# Patient Record
Sex: Male | Born: 1970
Health system: Southern US, Community
[De-identification: ages and names within clinical notes are randomized; demographics above are authoritative.]

## PROBLEM LIST (undated history)

## (undated) DIAGNOSIS — F329 Major depressive disorder, single episode, unspecified: Secondary | ICD-10-CM

## (undated) DIAGNOSIS — T8859XA Other complications of anesthesia, initial encounter: Secondary | ICD-10-CM

## (undated) DIAGNOSIS — E785 Hyperlipidemia, unspecified: Secondary | ICD-10-CM

## (undated) DIAGNOSIS — Z9889 Other specified postprocedural states: Secondary | ICD-10-CM

## (undated) DIAGNOSIS — K5900 Constipation, unspecified: Secondary | ICD-10-CM

## (undated) DIAGNOSIS — S0990XA Unspecified injury of head, initial encounter: Secondary | ICD-10-CM

## (undated) DIAGNOSIS — K219 Gastro-esophageal reflux disease without esophagitis: Secondary | ICD-10-CM

## (undated) DIAGNOSIS — G629 Polyneuropathy, unspecified: Secondary | ICD-10-CM

## (undated) DIAGNOSIS — Z87442 Personal history of urinary calculi: Secondary | ICD-10-CM

## (undated) DIAGNOSIS — I10 Essential (primary) hypertension: Secondary | ICD-10-CM

## (undated) DIAGNOSIS — F32A Depression, unspecified: Secondary | ICD-10-CM

## (undated) DIAGNOSIS — R0602 Shortness of breath: Secondary | ICD-10-CM

## (undated) DIAGNOSIS — T4145XA Adverse effect of unspecified anesthetic, initial encounter: Secondary | ICD-10-CM

## (undated) DIAGNOSIS — R112 Nausea with vomiting, unspecified: Secondary | ICD-10-CM

## (undated) DIAGNOSIS — N200 Calculus of kidney: Secondary | ICD-10-CM

## (undated) DIAGNOSIS — F419 Anxiety disorder, unspecified: Secondary | ICD-10-CM

## (undated) DIAGNOSIS — J349 Unspecified disorder of nose and nasal sinuses: Secondary | ICD-10-CM

## (undated) DIAGNOSIS — G4730 Sleep apnea, unspecified: Secondary | ICD-10-CM

## (undated) HISTORY — DX: Gastro-esophageal reflux disease without esophagitis: K21.9

## (undated) HISTORY — DX: Depression, unspecified: F32.A

## (undated) HISTORY — DX: Polyneuropathy, unspecified: G62.9

## (undated) HISTORY — PX: WISDOM TOOTH EXTRACTION: SHX21

## (undated) HISTORY — DX: Major depressive disorder, single episode, unspecified: F32.9

## (undated) HISTORY — DX: Hyperlipidemia, unspecified: E78.5

## (undated) HISTORY — PX: DENTAL SURGERY: SHX609

---

## 2003-11-20 ENCOUNTER — Ambulatory Visit (HOSPITAL_BASED_OUTPATIENT_CLINIC_OR_DEPARTMENT_OTHER): Admission: RE | Admit: 2003-11-20 | Discharge: 2003-11-20 | Payer: Self-pay | Admitting: Emergency Medicine

## 2005-05-23 ENCOUNTER — Encounter: Admission: RE | Admit: 2005-05-23 | Discharge: 2005-08-21 | Payer: Self-pay | Admitting: Emergency Medicine

## 2011-05-23 ENCOUNTER — Ambulatory Visit (INDEPENDENT_AMBULATORY_CARE_PROVIDER_SITE_OTHER): Payer: PRIVATE HEALTH INSURANCE | Admitting: Family Medicine

## 2011-05-23 ENCOUNTER — Encounter: Payer: Self-pay | Admitting: Family Medicine

## 2011-05-23 DIAGNOSIS — E119 Type 2 diabetes mellitus without complications: Secondary | ICD-10-CM

## 2011-05-23 DIAGNOSIS — E785 Hyperlipidemia, unspecified: Secondary | ICD-10-CM

## 2011-05-23 DIAGNOSIS — IMO0002 Reserved for concepts with insufficient information to code with codable children: Secondary | ICD-10-CM

## 2011-05-23 DIAGNOSIS — I10 Essential (primary) hypertension: Secondary | ICD-10-CM | POA: Insufficient documentation

## 2011-05-23 DIAGNOSIS — E1165 Type 2 diabetes mellitus with hyperglycemia: Secondary | ICD-10-CM

## 2011-05-23 DIAGNOSIS — IMO0001 Reserved for inherently not codable concepts without codable children: Secondary | ICD-10-CM

## 2011-05-23 MED ORDER — FENOFIBRATE 150 MG PO CAPS: 1.0000 | ORAL_CAPSULE | Freq: Every day | ORAL | Status: DC

## 2011-05-23 NOTE — Patient Instructions (Signed)
Follow up in 3 months to recheck diabetes We'll notify you of your lab results and make any changes if needed Try and eliminate sugar drinks and make small changes to portion size- you can totally do this! Call with any questions or concerns Think of [place] as your home base Welcome!  We're glad to have you! Happy Early [place] Ouch!!!

## 2011-05-23 NOTE — Progress Notes (Signed)
  Subjective:    Patient ID: Victor Estrada, male    DOB: 1970/11/11, 41 y.o.   MRN: [id number]  HPI New to establish.  Previous MD- Deboraha Sprang.  DM- chronic problem, dx'd in 2007.  On Janumet and Amaryl.  Infrequently checking CBGs.  Last had labs in the fall of 2012.  Denies symptomatic lows.  Overdue on eye exam- [facility].  No CP, SOB, HAs, visual changes, edema.  GERD- on Zegerid OTC w/ some relief.  Pt still reports frequent sxs.  HTN- chronic problem, fair control today.  On Lisinopril.  Hyperlipidemia- chronic problem, on Lipofen and Simvastatin.  Last labs done in the fall.  Denies abd pain, N/V, myalgias.  Obesity- chronic problem.  Has seen nutrition multiple times in the past.  Lost 12 lbs by stopping sugary drinks previously but then resumed and gained it all back.   Review of Systems For ROS see HPI     Objective:   Physical Exam  Vitals reviewed. Constitutional: He is oriented to person, place, and time. He appears well-developed and well-nourished. No distress.       obese  HENT:  Head: Normocephalic and atraumatic.  Eyes: Conjunctivae and EOM are normal. Pupils are equal, round, and reactive to light.  Neck: Normal range of motion. Neck supple. No thyromegaly present.  Cardiovascular: Normal rate, regular rhythm, normal heart sounds and intact distal pulses.   No murmur heard. Pulmonary/Chest: Effort normal and breath sounds normal. No respiratory distress.  Abdominal: Soft. Bowel sounds are normal. He exhibits no distension.  Musculoskeletal: He exhibits no edema.  Lymphadenopathy:    He has no cervical adenopathy.  Neurological: He is alert and oriented to person, place, and time. No cranial nerve deficit.  Skin: Skin is warm and dry.  Psychiatric: He has a normal mood and affect. His behavior is normal.          Assessment & Plan:

## 2011-05-24 LAB — CBC WITH DIFFERENTIAL/PLATELET
Basophils Absolute: 0 10*3/uL (ref 0.0–0.1)
Basophils Relative: 0.2 % (ref 0.0–3.0)
Eosinophils Absolute: 0.1 10*3/uL (ref 0.0–0.7)
Eosinophils Relative: 0.8 % (ref 0.0–5.0)
HCT: 41.4 % (ref 39.0–52.0)
Hemoglobin: 13.9 g/dL (ref 13.0–17.0)
Lymphocytes Relative: 29.1 % (ref 12.0–46.0)
Lymphs Abs: 2.7 10*3/uL (ref 0.7–4.0)
MCHC: 33.6 g/dL (ref 30.0–36.0)
MCV: 85.9 fl (ref 78.0–100.0)
Monocytes Absolute: 0.1 10*3/uL (ref 0.1–1.0)
Monocytes Relative: 0.7 % — ABNORMAL LOW (ref 3.0–12.0)
Neutro Abs: 6.4 10*3/uL (ref 1.4–7.7)
Neutrophils Relative %: 69.2 % (ref 43.0–77.0)
Platelets: 310 10*3/uL (ref 150.0–400.0)
RBC: 4.82 Mil/uL (ref 4.22–5.81)
RDW: 13.8 % (ref 11.5–14.6)
WBC: 9.3 10*3/uL (ref 4.5–10.5)

## 2011-05-24 LAB — LIPID PANEL
Cholesterol: 155 mg/dL (ref 0–200)
HDL: 33.1 mg/dL — ABNORMAL LOW (ref >39.00)
Total CHOL/HDL Ratio: 5
Triglycerides: 353 mg/dL — ABNORMAL HIGH (ref 0.0–149.0)
VLDL: 70.6 mg/dL — ABNORMAL HIGH (ref 0.0–40.0)

## 2011-05-24 LAB — HEPATIC FUNCTION PANEL
ALT: 29 U/L (ref 0–53)
AST: 24 U/L (ref 0–37)
Albumin: 3.8 g/dL (ref 3.5–5.2)
Alkaline Phosphatase: 57 U/L (ref 39–117)
Bilirubin, Direct: 0 mg/dL (ref 0.0–0.3)
Total Bilirubin: 0.2 mg/dL — ABNORMAL LOW (ref 0.3–1.2)
Total Protein: 7 g/dL (ref 6.0–8.3)

## 2011-05-24 LAB — BASIC METABOLIC PANEL
BUN: 20 mg/dL (ref 6–23)
CO2: 23 mEq/L (ref 19–32)
Calcium: 9.3 mg/dL (ref 8.4–10.5)
Chloride: 104 mEq/L (ref 96–112)
Creatinine, Ser: 1.4 mg/dL (ref 0.4–1.5)
GFR: 59.18 mL/min — ABNORMAL LOW (ref >60.00)
Glucose, Bld: 288 mg/dL — ABNORMAL HIGH (ref 70–99)
Potassium: 5 mEq/L (ref 3.5–5.1)
Sodium: 137 mEq/L (ref 135–145)

## 2011-05-24 LAB — LDL CHOLESTEROL, DIRECT: Direct LDL: 87 mg/dL

## 2011-05-24 LAB — TSH: TSH: 0.76 u[IU]/mL (ref 0.35–5.50)

## 2011-05-24 LAB — HEMOGLOBIN A1C: Hgb A1c MFr Bld: 9.9 % — ABNORMAL HIGH (ref 4.6–6.5)

## 2011-05-26 NOTE — Assessment & Plan Note (Signed)
New to provider- chronic for pt.  Taking meds w/out difficulty.  Not checking CBGs.  Due for labs.  Will adjust meds prn.  Stressed the importance of good control and reviewed long term complications of uncontrolled sugars.  Advised to schedule eye exam.  Pt expressed understanding and is in agreement w/ plan.

## 2011-05-26 NOTE — Assessment & Plan Note (Signed)
New to provider.  Chronic problem for pt.  Discussed seeing nutrition again- pt has done this multiple times before and is not interested at this point.  Discussed eliminating sugary drinks, starting regular exercise.  Fear this will become huge problem for pt if it is not addressed now.  Will follow closely.

## 2011-05-26 NOTE — Assessment & Plan Note (Signed)
Chronic problem.  New to provider.  Due for labs.  Tolerating statin w/out difficulty.  Adjust meds prn.

## 2011-05-26 NOTE — Assessment & Plan Note (Signed)
Chronic problem.  Fair control.  On ACE.  Asymptomatic.  Stressed importance of weight loss to improve this issue.  Will follow.

## 2011-05-27 ENCOUNTER — Telehealth: Payer: Self-pay | Admitting: Family Medicine

## 2011-05-29 NOTE — Telephone Encounter (Signed)
Opened in error

## 2011-06-14 ENCOUNTER — Telehealth: Payer: Self-pay | Admitting: Family Medicine

## 2011-06-14 MED ORDER — FENOFIBRATE 150 MG PO CAPS: 1.0000 | ORAL_CAPSULE | Freq: Every day | ORAL | Status: DC

## 2011-06-14 NOTE — Telephone Encounter (Signed)
Patient states pharmacy does not have rx for Fenofibrate 150 mg. He would like [place] to send it again.

## 2011-06-14 NOTE — Telephone Encounter (Signed)
Rx sent 

## 2011-06-17 ENCOUNTER — Other Ambulatory Visit: Payer: Self-pay | Admitting: Family Medicine

## 2011-06-17 MED ORDER — SIMVASTATIN 20 MG PO TABS: 20.0000 mg | ORAL_TABLET | Freq: Every evening | ORAL | Status: DC

## 2011-06-17 MED ORDER — LISINOPRIL 20 MG PO TABS: 20.0000 mg | ORAL_TABLET | Freq: Every day | ORAL | Status: DC

## 2011-06-17 MED ORDER — GLIMEPIRIDE 4 MG PO TABS: 4.0000 mg | ORAL_TABLET | Freq: Two times a day (BID) | ORAL | Status: DC

## 2011-06-17 MED ORDER — SITAGLIPTIN PHOS-METFORMIN HCL 50-1000 MG PO TABS: 1.0000 | ORAL_TABLET | Freq: Two times a day (BID) | ORAL | Status: DC

## 2011-06-17 MED ORDER — FENOFIBRATE 150 MG PO CAPS: 1.0000 | ORAL_CAPSULE | Freq: Every day | ORAL | Status: DC

## 2011-06-17 NOTE — Telephone Encounter (Signed)
Patient called & states [facility] did not get the refill send Friday for: Fenofibrate (LIPOFEN) 150 MG CAPS  Can you re-send for 90-days worth along with all of the following  Glimepiride Lisinopril Simvastatin Janumet All also for 90-days worth  To [facility] [address] Patient ph# [phone] Please call patient once sent as per his request

## 2011-06-17 NOTE — Telephone Encounter (Signed)
Rx sent 

## 2011-08-21 ENCOUNTER — Ambulatory Visit: Payer: PRIVATE HEALTH INSURANCE | Admitting: Family Medicine

## 2011-09-13 ENCOUNTER — Other Ambulatory Visit: Payer: Self-pay | Admitting: Family Medicine

## 2011-09-13 MED ORDER — GLIMEPIRIDE 4 MG PO TABS: 4.0000 mg | ORAL_TABLET | Freq: Two times a day (BID) | ORAL | Status: DC

## 2011-09-13 NOTE — Telephone Encounter (Signed)
Refill Glimepiride 4MG  Tablets #160, take one tablet by mouth twice daily, last fill 5.29.13, last ov 2.18.13

## 2011-09-13 NOTE — Telephone Encounter (Signed)
rx sent to pharmacy by e-script Letter has been mailed to pt address noted in the chart to advise they are overdue for cpe/ov/labs and the pt needs to contact office to set up appt   

## 2011-09-23 ENCOUNTER — Telehealth: Payer: Self-pay | Admitting: Family Medicine

## 2011-09-23 MED ORDER — SIMVASTATIN 20 MG PO TABS: 20.0000 mg | ORAL_TABLET | Freq: Every evening | ORAL | Status: DC

## 2011-09-23 MED ORDER — SITAGLIPTIN PHOS-METFORMIN HCL 50-1000 MG PO TABS: 1.0000 | ORAL_TABLET | Freq: Two times a day (BID) | ORAL | Status: DC

## 2011-09-23 NOTE — Telephone Encounter (Signed)
rx sent to pharmacy by e-script per noted pt has upcoming apt

## 2011-09-23 NOTE — Telephone Encounter (Signed)
Refill: Simvastatin 20mg  tablets. Take 1 tablet by mouth every evening. Qty 90. Last fill 06-17-11

## 2011-09-23 NOTE — Telephone Encounter (Signed)
Refill: Janumet 50/1000mg  tab. Take 1 tablet by mouth twice daily with a meal. Qty 180. Last fill 08-21-10

## 2011-10-01 ENCOUNTER — Ambulatory Visit (INDEPENDENT_AMBULATORY_CARE_PROVIDER_SITE_OTHER): Payer: PRIVATE HEALTH INSURANCE | Admitting: Family Medicine

## 2011-10-01 ENCOUNTER — Encounter: Payer: Self-pay | Admitting: Family Medicine

## 2011-10-01 VITALS — BP 122/82 | HR 100 | Temp 98.2°F | Ht 70.5 in | Wt 320.0 lb

## 2011-10-01 DIAGNOSIS — IMO0002 Reserved for concepts with insufficient information to code with codable children: Secondary | ICD-10-CM

## 2011-10-01 DIAGNOSIS — E785 Hyperlipidemia, unspecified: Secondary | ICD-10-CM

## 2011-10-01 DIAGNOSIS — E1165 Type 2 diabetes mellitus with hyperglycemia: Secondary | ICD-10-CM

## 2011-10-01 DIAGNOSIS — IMO0001 Reserved for inherently not codable concepts without codable children: Secondary | ICD-10-CM

## 2011-10-01 DIAGNOSIS — I10 Essential (primary) hypertension: Secondary | ICD-10-CM

## 2011-10-01 LAB — LIPID PANEL
Cholesterol: 142 mg/dL (ref 0–200)
HDL: 36.8 mg/dL — ABNORMAL LOW (ref >39.00)
Total CHOL/HDL Ratio: 4
Triglycerides: 216 mg/dL — ABNORMAL HIGH (ref 0.0–149.0)
VLDL: 43.2 mg/dL — ABNORMAL HIGH (ref 0.0–40.0)

## 2011-10-01 LAB — HEPATIC FUNCTION PANEL
ALT: 24 U/L (ref 0–53)
AST: 19 U/L (ref 0–37)
Albumin: 3.9 g/dL (ref 3.5–5.2)
Alkaline Phosphatase: 42 U/L (ref 39–117)
Bilirubin, Direct: 0 mg/dL (ref 0.0–0.3)
Total Bilirubin: 0.6 mg/dL (ref 0.3–1.2)
Total Protein: 7.3 g/dL (ref 6.0–8.3)

## 2011-10-01 LAB — BASIC METABOLIC PANEL
BUN: 18 mg/dL (ref 6–23)
CO2: 26 mEq/L (ref 19–32)
Calcium: 9 mg/dL (ref 8.4–10.5)
Chloride: 102 mEq/L (ref 96–112)
Creatinine, Ser: 1.1 mg/dL (ref 0.4–1.5)
GFR: 77.85 mL/min (ref >60.00)
Glucose, Bld: 218 mg/dL — ABNORMAL HIGH (ref 70–99)
Potassium: 4.6 mEq/L (ref 3.5–5.1)
Sodium: 137 mEq/L (ref 135–145)

## 2011-10-01 LAB — HEMOGLOBIN A1C: Hgb A1c MFr Bld: 9.9 % — ABNORMAL HIGH (ref 4.6–6.5)

## 2011-10-01 NOTE — Patient Instructions (Addendum)
Follow up in 3 months to recheck diabetes We'll notify you of your lab results and make any changes if needed Try and get back to healthy diet and regular exercise Call with any questions or concerns I'm so sorry for your loss!! Hang in there!!

## 2011-10-01 NOTE — Assessment & Plan Note (Signed)
Pt has lost some weight but given mom's recent death has not done as well as he would have liked.  Has recently resumed attention to healthy diet and is attempting exercise.  Check labs.  Adjust meds prn.

## 2011-10-01 NOTE — Assessment & Plan Note (Signed)
Chronic problem.  Adequate control.  Asymptomatic.  No changes. 

## 2011-10-01 NOTE — Progress Notes (Signed)
  Subjective:    Patient ID: Victor Estrada, male    DOB: 1971-01-24, 41 y.o.   MRN: [id number]  HPI HTN- chronic problem, well controlled today.  On Lisinopril.  Denies CP, SOB, HAs, visual changes, edema.  Hyperlipidemia- chronic problem.  On Zocor and fenofibrate.  Due for labs.  Denies abd pain, N/V, myalgias  DM- chronic problem.  Has lost 8 lbs since last visit.  Had lost more weight but then mom got cancer and died w/in 6 weeks.  'i ate like crap during that time frame'.  On Amaryl and Janumet.  Is attempting to improve diet and get regular exercise.  Denies symptomatic lows, N/V.     Review of Systems For ROS see HPI     Objective:   Physical Exam  Vitals reviewed. Constitutional: He is oriented to person, place, and time. He appears well-developed and well-nourished. No distress.  HENT:  Head: Normocephalic and atraumatic.  Eyes: Conjunctivae and EOM are normal. Pupils are equal, round, and reactive to light.  Neck: Normal range of motion. Neck supple. No thyromegaly present.  Cardiovascular: Normal rate, regular rhythm, normal heart sounds and intact distal pulses.   No murmur heard. Pulmonary/Chest: Effort normal and breath sounds normal. No respiratory distress.  Abdominal: Soft. Bowel sounds are normal. He exhibits no distension.  Musculoskeletal: He exhibits no edema.  Lymphadenopathy:    He has no cervical adenopathy.  Neurological: He is alert and oriented to person, place, and time. No cranial nerve deficit.  Skin: Skin is warm and dry.  Psychiatric: He has a normal mood and affect. His behavior is normal.          Assessment & Plan:

## 2011-10-01 NOTE — Assessment & Plan Note (Signed)
Chronic problem.  Tolerating meds w/out difficulty.  Attempting to lose weight.  Check labs.  Adjust meds prn

## 2011-10-02 LAB — LDL CHOLESTEROL, DIRECT: Direct LDL: 76.3 mg/dL

## 2011-10-11 ENCOUNTER — Telehealth: Payer: Self-pay | Admitting: Family Medicine

## 2011-10-11 MED ORDER — FENOFIBRATE 150 MG PO CAPS: 1.0000 | ORAL_CAPSULE | Freq: Every day | ORAL | Status: DC

## 2011-10-11 NOTE — Telephone Encounter (Signed)
Pt states we were supposed to call in refills for his Fenofibrate 150mg  when he was here for his visit on 10/01/11 and we never did. He is now calling requesting this be refilled today because he is leaving to go out of town. Pt uses [facility] [address]

## 2011-10-11 NOTE — Telephone Encounter (Signed)
rx sent to pharmacy by e-script  

## 2011-10-21 ENCOUNTER — Telehealth: Payer: Self-pay | Admitting: Family Medicine

## 2011-10-21 MED ORDER — LISINOPRIL 20 MG PO TABS: 20.0000 mg | ORAL_TABLET | Freq: Every day | ORAL | Status: DC

## 2011-10-21 NOTE — Telephone Encounter (Signed)
Refill: Lisinopril 20mg  tablets. Take 1 tablet by mouth daily. Qty 90. Last fill 09-12-11

## 2011-10-21 NOTE — Telephone Encounter (Signed)
rx sent to pharmacy by e-script  

## 2011-11-20 ENCOUNTER — Other Ambulatory Visit: Payer: Self-pay | Admitting: Family Medicine

## 2011-11-20 MED ORDER — SIMVASTATIN 20 MG PO TABS: 20.0000 mg | ORAL_TABLET | Freq: Every evening | ORAL | Status: DC

## 2011-11-20 NOTE — Telephone Encounter (Signed)
refill Simvastatin (Tab) 20 MG Take 1 tablet (20 mg total) by mouth every evening. #90, last fill 7.1.13 Last ov 7.9.13 f/u DM

## 2011-11-20 NOTE — Telephone Encounter (Signed)
rx sent to pharmacy by e-script  

## 2011-11-29 ENCOUNTER — Other Ambulatory Visit: Payer: Self-pay | Admitting: Family Medicine

## 2011-11-29 MED ORDER — GLIMEPIRIDE 4 MG PO TABS: 4.0000 mg | ORAL_TABLET | Freq: Two times a day (BID) | ORAL | Status: DC

## 2011-11-29 NOTE — Telephone Encounter (Signed)
rx sent to pharmacy by e-script  

## 2011-11-29 NOTE — Telephone Encounter (Signed)
Refill Glimepiride (Tab) 4 MG Take 1 tablet (4 mg total) by mouth 2 (two) times daily #160 last fill 8.19.13 Last ov 7.9.13 f/u DM

## 2011-12-20 ENCOUNTER — Encounter: Payer: Self-pay | Admitting: Family Medicine

## 2011-12-20 ENCOUNTER — Ambulatory Visit (INDEPENDENT_AMBULATORY_CARE_PROVIDER_SITE_OTHER): Payer: PRIVATE HEALTH INSURANCE | Admitting: Family Medicine

## 2011-12-20 ENCOUNTER — Telehealth: Payer: Self-pay | Admitting: Family Medicine

## 2011-12-20 VITALS — BP 128/87 | HR 102 | Temp 98.4°F | Ht 69.5 in | Wt 324.0 lb

## 2011-12-20 DIAGNOSIS — J329 Chronic sinusitis, unspecified: Secondary | ICD-10-CM

## 2011-12-20 MED ORDER — AMOXICILLIN 875 MG PO TABS: 875.0000 mg | ORAL_TABLET | Freq: Two times a day (BID) | ORAL | Status: DC

## 2011-12-20 NOTE — Telephone Encounter (Signed)
MD Beverely Low will see pt today

## 2011-12-20 NOTE — Patient Instructions (Addendum)
This is likely an early sinus infection Start the Amox twice daily- take w/ food Drink plenty of fluids Mucinex to thin your congestion REST! Hang in there!!!

## 2011-12-20 NOTE — Progress Notes (Signed)
  Subjective:    Patient ID: Victor Estrada, male    DOB: June 09, 1970, 42 y.o.   MRN: [id number]  HPI ? Sinus infection- sxs started over the weekend, worsened yesterday w/ sinus pressure, nasal congestion, chills.  +HA.  No ear pain.  + tooth pain.  Minimal cough.  No documented fever.  + sick contacts.   Review of Systems For ROS see HPI     Objective:   Physical Exam  Constitutional: He appears well-developed and well-nourished. No distress.  HENT:  Head: Normocephalic and atraumatic.  Right Ear: Tympanic membrane normal.  Left Ear: Tympanic membrane normal.  Nose: Mucosal edema and rhinorrhea present. Right sinus exhibits maxillary sinus tenderness and frontal sinus tenderness. Left sinus exhibits maxillary sinus tenderness and frontal sinus tenderness.  Mouth/Throat: Mucous membranes are normal. Oropharyngeal exudate and posterior oropharyngeal erythema present. No posterior oropharyngeal edema.       + PND  Eyes: Conjunctivae normal and EOM are normal. Pupils are equal, round, and reactive to light.  Neck: Normal range of motion. Neck supple.  Cardiovascular: Normal rate, regular rhythm and normal heart sounds.   Pulmonary/Chest: Effort normal and breath sounds normal. No respiratory distress. He has no wheezes.  Lymphadenopathy:    He has no cervical adenopathy.  Skin: Skin is warm and dry.          Assessment & Plan:

## 2011-12-20 NOTE — Assessment & Plan Note (Signed)
New.  Start abx.  Reviewed supportive care and red flags that should prompt return.  Pt expressed understanding and is in agreement w/ plan.  

## 2011-12-20 NOTE — Telephone Encounter (Signed)
Was treated by MD Tabori in office today 

## 2011-12-20 NOTE — Telephone Encounter (Signed)
Caller: [name]; Patient Name: Victor Estrada; PCP: Sheliah Hatch.; Best Callback Phone Number: [phone]. Onset of sinus pressure for 12/18/11.  Nasal drainage -green in color. Felt warm unsure of fever. +slight sore throat. Feels sinus pressure. Eating and drinking. Emergent s/sx ruled out per Upper Respiratory Infection with exception to "Productive Cough with colored sputum".  See Provider in 24 hours. Appointment scheduled today 12/20/11  with Dr. Beverely Low at 11;15.  Caller expressed understanding. Home care advise given. Advised to call back for questions, changes or concerns.

## 2011-12-23 ENCOUNTER — Telehealth: Payer: Self-pay | Admitting: Family Medicine

## 2011-12-23 MED ORDER — SITAGLIPTIN PHOS-METFORMIN HCL 50-1000 MG PO TABS: 1.0000 | ORAL_TABLET | Freq: Two times a day (BID) | ORAL | Status: DC

## 2011-12-23 NOTE — Telephone Encounter (Signed)
rx sent to pharmacy by e-script to last pt til upcoming OV 

## 2011-12-23 NOTE — Telephone Encounter (Signed)
Refill: Janumet 50/1000 mg tablets. Take 1 tablet by mouth twice daily with a meal. Qty 180. Last fill 8.30.13

## 2012-01-01 ENCOUNTER — Ambulatory Visit: Payer: PRIVATE HEALTH INSURANCE | Admitting: Family Medicine

## 2012-01-08 ENCOUNTER — Other Ambulatory Visit: Payer: Self-pay | Admitting: Family Medicine

## 2012-01-08 NOTE — Telephone Encounter (Signed)
refill glimepiride 4mg  tablets #160 TK 1 T PO BID --LAST FILL 10.8.13--last ov 9.27.13-acute

## 2012-01-09 MED ORDER — GLIMEPIRIDE 4 MG PO TABS: 4.0000 mg | ORAL_TABLET | Freq: Two times a day (BID) | ORAL | Status: DC

## 2012-01-09 NOTE — Telephone Encounter (Signed)
refill glimepiride 4mg  tablets #160 TK 1 T PO BID --LAST FILL 10.8.13 --last ov 9.27.13-acute      plz advise          MW

## 2012-01-09 NOTE — Telephone Encounter (Signed)
Ok for #180, 1 refill 

## 2012-01-09 NOTE — Telephone Encounter (Signed)
Rx sent.    MW 

## 2012-01-15 ENCOUNTER — Other Ambulatory Visit: Payer: Self-pay | Admitting: Family Medicine

## 2012-01-15 NOTE — Telephone Encounter (Signed)
refill Lipofen 150mg  Capsules #90TK 1 C PO QD --LAST OV 9.27.13 Acute

## 2012-01-16 MED ORDER — FENOFIBRATE 150 MG PO CAPS: 1.0000 | ORAL_CAPSULE | Freq: Every day | ORAL | Status: DC

## 2012-01-16 NOTE — Telephone Encounter (Signed)
Rx sent.    MW 

## 2012-01-22 ENCOUNTER — Other Ambulatory Visit: Payer: Self-pay | Admitting: Family Medicine

## 2012-01-22 MED ORDER — SITAGLIPTIN PHOS-METFORMIN HCL 50-1000 MG PO TABS: 1.0000 | ORAL_TABLET | Freq: Two times a day (BID) | ORAL | Status: DC

## 2012-01-22 NOTE — Telephone Encounter (Signed)
rx sent to pharmacy by e-script  

## 2012-01-22 NOTE — Telephone Encounter (Signed)
refill janumet 50/1000mg  tablets #60 take 1 tablet by mouth twice daily with a meal last fill 9.30.13--last ov 9.27.13 acute

## 2012-01-27 ENCOUNTER — Ambulatory Visit (INDEPENDENT_AMBULATORY_CARE_PROVIDER_SITE_OTHER): Payer: PRIVATE HEALTH INSURANCE | Admitting: Family Medicine

## 2012-01-27 ENCOUNTER — Encounter: Payer: Self-pay | Admitting: Family Medicine

## 2012-01-27 VITALS — BP 126/80 | HR 98 | Temp 98.1°F | Resp 16 | Wt 323.2 lb

## 2012-01-27 DIAGNOSIS — IMO0001 Reserved for inherently not codable concepts without codable children: Secondary | ICD-10-CM

## 2012-01-27 DIAGNOSIS — F4323 Adjustment disorder with mixed anxiety and depressed mood: Secondary | ICD-10-CM

## 2012-01-27 DIAGNOSIS — Z23 Encounter for immunization: Secondary | ICD-10-CM

## 2012-01-27 DIAGNOSIS — IMO0002 Reserved for concepts with insufficient information to code with codable children: Secondary | ICD-10-CM

## 2012-01-27 DIAGNOSIS — E1165 Type 2 diabetes mellitus with hyperglycemia: Secondary | ICD-10-CM

## 2012-01-27 LAB — BASIC METABOLIC PANEL
BUN: 17 mg/dL (ref 6–23)
CO2: 23 mEq/L (ref 19–32)
Calcium: 9.1 mg/dL (ref 8.4–10.5)
Chloride: 104 mEq/L (ref 96–112)
Creatinine, Ser: 1 mg/dL (ref 0.4–1.5)
GFR: 89.74 mL/min (ref >60.00)
Glucose, Bld: 232 mg/dL — ABNORMAL HIGH (ref 70–99)
Potassium: 4.5 mEq/L (ref 3.5–5.1)
Sodium: 137 mEq/L (ref 135–145)

## 2012-01-27 LAB — HEMOGLOBIN A1C: Hgb A1c MFr Bld: 10.5 % — ABNORMAL HIGH (ref 4.6–6.5)

## 2012-01-27 NOTE — Patient Instructions (Addendum)
We'll notify you of your lab results and determine the next steps Call and set up counseling- either with your previous provider or w/ Hospice Continue to make healthy food choices and start regular exercise- this is also a good stress outlet Call with any questions or concerns Hang in there!!!

## 2012-01-27 NOTE — Assessment & Plan Note (Signed)
Chronic problem.  Pt wanted to hold on Victoza 3 months ago and work on diet and exercise.  Has not done this- has actually gained weight.  A1C has increased and is now about 10.  Will refer to Endo for management of DM.

## 2012-01-27 NOTE — Progress Notes (Signed)
  Subjective:    Patient ID: Victor Estrada, male    DOB: 08/04/1970, 41 y.o.   MRN: [id number]  HPI DM- chronic problem, A1C was 9.9 at last check.  Discussed starting Victoza at that time but pt wanted to wait 3 more months to improve diet and exercise.  Pt admits that 6 weeks ago 'i went off the track real bad'.  Sugars have been high.  Has been depressed about death of mom.  At highest weight was 337 lbs on home school.  Denies CP, SOB, HAs, visual changes, edema.  Grief- pt reports he's having difficulty getting out of bed some mornings.  'sometimes i work- sometimes i don't'.  Was eating very poorly.  Stopped exercising.  Admits to anger over mom's death.  Feels no one can relate to his loss.  Denies SI/HI.   Review of Systems For ROS see HPI     Objective:   Physical Exam  Vitals reviewed. Constitutional: He is oriented to person, place, and time. He appears well-developed and well-nourished. No distress.  HENT:  Head: Normocephalic and atraumatic.  Eyes: Conjunctivae normal and EOM are normal. Pupils are equal, round, and reactive to light.  Neck: Normal range of motion. Neck supple. No thyromegaly present.  Cardiovascular: Normal rate, regular rhythm, normal heart sounds and intact distal pulses.   No murmur heard. Pulmonary/Chest: Effort normal and breath sounds normal. No respiratory distress.  Abdominal: Soft. Bowel sounds are normal. He exhibits no distension.  Musculoskeletal: He exhibits no edema.  Lymphadenopathy:    He has no cervical adenopathy.  Neurological: He is alert and oriented to person, place, and time. No cranial nerve deficit.  Skin: Skin is warm and dry.  Psychiatric: He has a normal mood and affect. His behavior is normal.          Assessment & Plan:

## 2012-01-27 NOTE — Assessment & Plan Note (Signed)
New.  Pt is grieving for mom and still very angry about loss.  Encouraged him to seek counseling or hospice grief support group.  Pt in agreement.  Will follow.

## 2012-01-27 NOTE — Assessment & Plan Note (Addendum)
Chronic problem.  Pt continues to gain weight rather than lose.  Pt aware that this is a problem and negatively impacting multiple health issues.  Strongly encouraged him to resume exercise and follow low carb diet.  Will continue to follow.

## 2012-02-05 ENCOUNTER — Ambulatory Visit (INDEPENDENT_AMBULATORY_CARE_PROVIDER_SITE_OTHER): Payer: PRIVATE HEALTH INSURANCE | Admitting: Endocrinology

## 2012-02-05 ENCOUNTER — Encounter: Payer: Self-pay | Admitting: Endocrinology

## 2012-02-05 VITALS — BP 126/70 | HR 104 | Temp 98.2°F | Wt 328.0 lb

## 2012-02-05 DIAGNOSIS — IMO0001 Reserved for inherently not codable concepts without codable children: Secondary | ICD-10-CM

## 2012-02-05 DIAGNOSIS — E1165 Type 2 diabetes mellitus with hyperglycemia: Secondary | ICD-10-CM

## 2012-02-05 NOTE — Progress Notes (Signed)
Subjective:    Patient ID: Victor Estrada, male    DOB: [birth year], 41 y.o.   MRN: [id number]  HPI pt states 8 years h/o dm.  it is complicated by retinopathy.  he has never been on insulin.  pt says his diet and exercise have been limited for the past year or so, by his mother's illness (she passed away a few mos ago).   Pt states a few years of slight intermittent blurry vision from both eyes, and assoc fatigue.  He says he has recently seen opthal for eye sxs. Past Medical History  Diagnosis Date  . Diabetes mellitus   . Hyperlipidemia   . GERD (gastroesophageal reflux disease)   . Kidney stones    No past surgical history on file.  History   Social History  . Marital Status: Married    Spouse Name: N/A    Number of Children: N/A  . Years of Education: N/A   Occupational History  . Not on file.   Social History Main Topics  . Smoking status: Never Smoker   . Smokeless tobacco: Not on file  . Alcohol Use: No  . Drug Use: No  . Sexually Active: Not on file   Other Topics Concern  . Not on file   Social History Narrative  . No narrative on file    Current Outpatient Prescriptions on File Prior to Visit  Medication Sig Dispense Refill  . amoxicillin (AMOXIL) 875 MG tablet Take 1 tablet (875 mg total) by mouth 2 (two) times daily.  20 tablet  0  . Fenofibrate (LIPOFEN) 150 MG CAPS Take 1 capsule (150 mg total) by mouth daily.  90 each  1  . glimepiride (AMARYL) 4 MG tablet Take 1 tablet (4 mg total) by mouth 2 (two) times daily.  180 tablet  1  . lisinopril (PRINIVIL,ZESTRIL) 20 MG tablet Take 1 tablet (20 mg total) by mouth daily.  90 tablet  1  . simvastatin (ZOCOR) 20 MG tablet Take 1 tablet (20 mg total) by mouth every evening.  90 tablet  0  . sitaGLIPtan-metformin (JANUMET) 50-1000 MG per tablet Take 1 tablet by mouth 2 (two) times daily with a meal.  60 tablet  2    No Known Allergies  Family History  Problem Relation Age of Onset  . Hyperlipidemia Father     . Heart disease Father   . Diabetes Father   . Heart disease Maternal Grandmother   . Diabetes Paternal Grandmother    There were no vitals taken for this visit.  Review of Systems denies weight loss, headache, chest pain, sob, n/v, urinary frequency, hypoglycemia, and easy bruising.  He has bilat foot pain, worst at night.  zegerid helps heartburn.   He has intermittent leg cramps, difficulty with concentration, depression, rhinorrhea, and excessive diaphoresis.    Objective:   Physical Exam VS: see vs page GEN: no distress.  Morbid obesity.   HEAD: head: no deformity eyes: no periorbital swelling, no proptosis external nose and ears are normal.   mouth: no lesion seen NECK: supple, thyroid is not enlarged CHEST WALL: no deformity LUNGS:  Clear to auscultation CV: reg rate and rhythm, no murmur ABD: abdomen is soft, nontender.  no hepatosplenomegaly.  not distended.  no hernia MUSCULOSKELETAL: muscle bulk and strength are grossly normal.  no obvious joint swelling.  gait is normal and steady EXTEMITIES: no deformity.  no ulcer on the feet.  feet are of normal color and temp.  Trace bilat leg edema.   PULSES: dorsalis pedis intact bilat.  no carotid bruit NEURO:  cn 2-12 grossly intact.   readily moves all 4's.  sensation is intact to touch on the feet SKIN:  Normal texture and temperature.  No rash or suspicious lesion is visible.   NODES:  None palpable at the neck PSYCH: alert, oriented x3.  Does not appear anxious nor depressed. Lab Results  Component Value Date   HGBA1C 10.5* 01/27/2012      Assessment & Plan:  Type 2 DM.  Although orals could be added, it is very unlikely he would be controlled without insulin. Depression, exac by bereavement.  This complicates the rx of DM. Blurry vision, uncertain etiology.  Could be DM-related.

## 2012-02-05 NOTE — Patient Instructions (Addendum)
good diet and exercise habits significanly improve the control of your diabetes.  please let me know if you wish to be referred to a dietician, or for weight-loss surgery.  high blood sugar is very risky to your health.  you should see an eye doctor every year.  You are at higher than average risk for pneumonia and hepatitis-B.  You should be vaccinated against both.   controlling your blood pressure and cholesterol drastically reduces the damage diabetes does to your body.  this also applies to quitting smoking.  please discuss these with your doctor.  you should take an aspirin every day, unless you have been advised by a doctor not to. check your blood sugar twice a day.  vary the time of day when you check, between before the 3 meals, and at bedtime.  also check if you have symptoms of your blood sugar being too high or too low.  please keep a record of the readings and bring it to your next appointment here.  please call us sooner if your blood sugar goes below 70, or if you have a lot of readings over 200. You should take insulin Please consider your options and let me know.

## 2012-03-23 ENCOUNTER — Telehealth: Payer: Self-pay | Admitting: Family Medicine

## 2012-03-23 NOTE — Telephone Encounter (Signed)
refill Simvastatin (Tab) 20 MG Take 1 tablet (20 mg total) by mouth every evening. #90 last fill 9.30.13, last ov wt/labs 11.4.13 DM f/u

## 2012-03-24 ENCOUNTER — Other Ambulatory Visit: Payer: Self-pay | Admitting: *Deleted

## 2012-03-24 DIAGNOSIS — E785 Hyperlipidemia, unspecified: Secondary | ICD-10-CM

## 2012-03-24 MED ORDER — SIMVASTATIN 20 MG PO TABS: 20.0000 mg | ORAL_TABLET | Freq: Every evening | ORAL | Status: DC

## 2012-03-24 NOTE — Telephone Encounter (Signed)
Refill for simvastatin sent to pharmacy 

## 2012-04-21 ENCOUNTER — Other Ambulatory Visit: Payer: Self-pay | Admitting: Family Medicine

## 2012-04-21 MED ORDER — LISINOPRIL 20 MG PO TABS: 20.0000 mg | ORAL_TABLET | Freq: Every day | ORAL | Status: DC

## 2012-04-21 NOTE — Telephone Encounter (Signed)
refill Lisinopril (Tab) 20 MG Take 1 tablet (20 mg total) by mouth daily. #90 last fill 10.30.13

## 2012-04-21 NOTE — Telephone Encounter (Signed)
Rx sent to the pharmacy by e-script.//AB/CMA 

## 2012-04-22 ENCOUNTER — Other Ambulatory Visit: Payer: Self-pay | Admitting: Family Medicine

## 2012-04-22 NOTE — Telephone Encounter (Signed)
refill JANUMET 50-1000 MG Take 1 tablet by mouth 2 (two) times daily with a meal. #60 last fill 12.30.13

## 2012-04-23 MED ORDER — SITAGLIPTIN PHOS-METFORMIN HCL 50-1000 MG PO TABS: 1.0000 | ORAL_TABLET | Freq: Two times a day (BID) | ORAL | Status: DC

## 2012-04-23 NOTE — Telephone Encounter (Signed)
Refill done.  

## 2012-04-30 ENCOUNTER — Other Ambulatory Visit: Payer: Self-pay | Admitting: *Deleted

## 2012-04-30 DIAGNOSIS — E119 Type 2 diabetes mellitus without complications: Secondary | ICD-10-CM

## 2012-04-30 DIAGNOSIS — I10 Essential (primary) hypertension: Secondary | ICD-10-CM

## 2012-04-30 MED ORDER — SITAGLIPTIN PHOS-METFORMIN HCL 50-1000 MG PO TABS: 1.0000 | ORAL_TABLET | Freq: Two times a day (BID) | ORAL | Status: DC

## 2012-04-30 MED ORDER — LISINOPRIL 20 MG PO TABS: 20.0000 mg | ORAL_TABLET | Freq: Every day | ORAL | Status: DC

## 2012-04-30 NOTE — Telephone Encounter (Signed)
Refills for Janumet and Lisinopril sent to [facility] in [place] per to request.

## 2012-06-29 ENCOUNTER — Telehealth: Payer: Self-pay | Admitting: Family Medicine

## 2012-06-29 DIAGNOSIS — E785 Hyperlipidemia, unspecified: Secondary | ICD-10-CM

## 2012-06-29 MED ORDER — SIMVASTATIN 20 MG PO TABS: 20.0000 mg | ORAL_TABLET | Freq: Every evening | ORAL | Status: DC

## 2012-06-29 NOTE — Telephone Encounter (Signed)
Refill: Simvastatin 20 mg tablets. Take 1 tablet by mouth every evening. Qty 90. Last fill 03-24-12

## 2012-06-29 NOTE — Telephone Encounter (Signed)
Rx sent to the pharmacy by e-script.//AB/CMA 

## 2012-07-08 ENCOUNTER — Telehealth: Payer: Self-pay | Admitting: Family Medicine

## 2012-07-08 NOTE — Telephone Encounter (Signed)
GLIMEPIRIDE 4 MG TABLETS QTY:180 LAST REFILL: 2.9.24 TK 1 T PO BID

## 2012-07-13 MED ORDER — GLIMEPIRIDE 4 MG PO TABS: 4.0000 mg | ORAL_TABLET | Freq: Two times a day (BID) | ORAL | Status: DC

## 2012-07-13 NOTE — Telephone Encounter (Signed)
Med filled.  

## 2012-09-02 ENCOUNTER — Other Ambulatory Visit: Payer: Self-pay | Admitting: General Practice

## 2012-09-02 DIAGNOSIS — E785 Hyperlipidemia, unspecified: Secondary | ICD-10-CM

## 2012-09-02 MED ORDER — SIMVASTATIN 20 MG PO TABS: 20.0000 mg | ORAL_TABLET | Freq: Every evening | ORAL | Status: DC

## 2012-10-19 ENCOUNTER — Other Ambulatory Visit: Payer: Self-pay | Admitting: Family Medicine

## 2012-10-21 ENCOUNTER — Other Ambulatory Visit: Payer: Self-pay | Admitting: *Deleted

## 2012-10-21 MED ORDER — FENOFIBRATE 150 MG PO CAPS: 1.0000 | ORAL_CAPSULE | Freq: Every day | ORAL | Status: DC

## 2012-10-21 NOTE — Telephone Encounter (Signed)
RF for lipofen sent to [facility]

## 2012-12-30 ENCOUNTER — Other Ambulatory Visit: Payer: Self-pay | Admitting: Family Medicine

## 2012-12-30 NOTE — Telephone Encounter (Signed)
Med filled for only 30 days. Needs an OV.

## 2013-01-19 ENCOUNTER — Other Ambulatory Visit: Payer: Self-pay | Admitting: Family Medicine

## 2013-01-20 NOTE — Telephone Encounter (Signed)
Last OV 01-27-12 Med filled 09-24-12 #90 with 1 refill

## 2013-01-28 ENCOUNTER — Other Ambulatory Visit: Payer: Self-pay

## 2013-02-01 ENCOUNTER — Other Ambulatory Visit: Payer: Self-pay | Admitting: Family Medicine

## 2013-02-01 NOTE — Telephone Encounter (Signed)
Last OV 01-27-12 Meds both filled 12-30-12 #30 with 0  No upcoming appts.

## 2013-02-01 NOTE — Telephone Encounter (Signed)
Med filled.  

## 2013-02-02 ENCOUNTER — Other Ambulatory Visit: Payer: Self-pay | Admitting: Family Medicine

## 2013-02-02 NOTE — Telephone Encounter (Signed)
Please advise pt was established with Victor Estrada for diabetes last year. Has not seen either provider in a year please advise who is responsible for this refill?

## 2013-02-03 NOTE — Telephone Encounter (Signed)
Pt was supposed to follow up w/ Dr Everardo All- no refills

## 2013-02-05 LAB — HM DIABETES EYE EXAM

## 2013-02-06 ENCOUNTER — Other Ambulatory Visit: Payer: Self-pay | Admitting: Family Medicine

## 2013-02-08 NOTE — Telephone Encounter (Signed)
Med filled for only one month. Pt needs an appt.

## 2013-04-09 ENCOUNTER — Other Ambulatory Visit: Payer: Self-pay | Admitting: Family Medicine

## 2013-04-09 NOTE — Telephone Encounter (Signed)
Med filled with 1 refill, pt has a CPE next month.

## 2013-05-19 ENCOUNTER — Encounter: Payer: PRIVATE HEALTH INSURANCE | Admitting: Family Medicine

## 2013-05-26 ENCOUNTER — Telehealth: Payer: Self-pay

## 2013-05-26 NOTE — Telephone Encounter (Signed)
Left message for call back Non-identifiable   Flu- Td- 03/25/06

## 2013-05-28 ENCOUNTER — Ambulatory Visit (INDEPENDENT_AMBULATORY_CARE_PROVIDER_SITE_OTHER): Payer: BC Managed Care – PPO | Admitting: Family Medicine

## 2013-05-28 ENCOUNTER — Encounter: Payer: Self-pay | Admitting: Family Medicine

## 2013-05-28 VITALS — BP 124/74 | HR 85 | Temp 98.1°F | Resp 16 | Ht 70.5 in | Wt 316.4 lb

## 2013-05-28 DIAGNOSIS — J309 Allergic rhinitis, unspecified: Secondary | ICD-10-CM

## 2013-05-28 DIAGNOSIS — IMO0002 Reserved for concepts with insufficient information to code with codable children: Secondary | ICD-10-CM

## 2013-05-28 DIAGNOSIS — IMO0001 Reserved for inherently not codable concepts without codable children: Secondary | ICD-10-CM

## 2013-05-28 DIAGNOSIS — E1165 Type 2 diabetes mellitus with hyperglycemia: Secondary | ICD-10-CM

## 2013-05-28 DIAGNOSIS — K219 Gastro-esophageal reflux disease without esophagitis: Secondary | ICD-10-CM | POA: Insufficient documentation

## 2013-05-28 DIAGNOSIS — Z0000 Encounter for general adult medical examination without abnormal findings: Secondary | ICD-10-CM

## 2013-05-28 LAB — BASIC METABOLIC PANEL
BUN: 20 mg/dL (ref 6–23)
CO2: 24 mEq/L (ref 19–32)
Calcium: 9.2 mg/dL (ref 8.4–10.5)
Chloride: 102 mEq/L (ref 96–112)
Creatinine, Ser: 0.9 mg/dL (ref 0.4–1.5)
GFR: 95.9 mL/min (ref >60.00)
Glucose, Bld: 89 mg/dL (ref 70–99)
Potassium: 4.1 mEq/L (ref 3.5–5.1)
Sodium: 137 mEq/L (ref 135–145)

## 2013-05-28 LAB — LIPID PANEL
Cholesterol: 108 mg/dL (ref 0–200)
HDL: 32.4 mg/dL — ABNORMAL LOW (ref >39.00)
LDL Cholesterol: 39 mg/dL (ref 0–99)
Total CHOL/HDL Ratio: 3
Triglycerides: 185 mg/dL — ABNORMAL HIGH (ref 0.0–149.0)
VLDL: 37 mg/dL (ref 0.0–40.0)

## 2013-05-28 LAB — HEPATIC FUNCTION PANEL
ALT: 28 U/L (ref 0–53)
AST: 18 U/L (ref 0–37)
Albumin: 3.7 g/dL (ref 3.5–5.2)
Alkaline Phosphatase: 60 U/L (ref 39–117)
Bilirubin, Direct: 0 mg/dL (ref 0.0–0.3)
Total Bilirubin: 0.4 mg/dL (ref 0.3–1.2)
Total Protein: 7 g/dL (ref 6.0–8.3)

## 2013-05-28 LAB — HEMOGLOBIN A1C: Hgb A1c MFr Bld: 6.8 % — ABNORMAL HIGH (ref 4.6–6.5)

## 2013-05-28 LAB — TSH: TSH: 0.51 u[IU]/mL (ref 0.35–5.50)

## 2013-05-28 MED ORDER — PANTOPRAZOLE SODIUM 40 MG PO TBEC: 40.0000 mg | DELAYED_RELEASE_TABLET | Freq: Every day | ORAL | Status: DC

## 2013-05-28 NOTE — Assessment & Plan Note (Signed)
New.  Pt's sxs are not relieved w/ OTC meds.  Start prescription Protonix and refer to GI for complete evaluation.  Pt expressed understanding and is in agreement w/ plan.

## 2013-05-28 NOTE — Assessment & Plan Note (Signed)
Pt's PE WNL w/ exception of morbid obesity.  Check labs.  Anticipatory guidance provided.

## 2013-05-28 NOTE — Assessment & Plan Note (Signed)
New.  Start symptomatic tx w/ OTC antihistamine and nasal steroid.  If no improvement, will then refer to ENT.

## 2013-05-28 NOTE — Progress Notes (Signed)
   Subjective:    Patient ID: Victor Estrada, male    DOB: 1970/12/08, 43 y.o.   MRN: [id number]  HPI CPE-   GERD- pt is on Zegerid OTC but continues to have constant reflux regardless of what he eats.  Frequent belching, gas bubbles in chest.  Sinus issues- pt reports 'they're always been bad but it's worse'.  Reports regularly blowing nose and getting copious return.  Not currently taking OTC antihistamine.  DM- has not had eye exam in 2 yrs.  No numbness/tingling hands/feet.  Not checking CBGs regularly but has been 'good' when spot checked.   Review of Systems Patient reports no vision/hearing changes, anorexia, fever ,adenopathy, persistant/recurrent hoarseness, swallowing issues, chest pain, palpitations, edema, persistant/recurrent cough, hemoptysis, dyspnea (rest,exertional, paroxysmal nocturnal), gastrointestinal  bleeding (melena, rectal bleeding), abdominal pain, GU symptoms (dysuria, hematuria, voiding/incontinence issues) syncope, focal weakness, memory loss, numbness & tingling, skin/hair/nail changes, depression, anxiety, abnormal bruising/bleeding, musculoskeletal symptoms/signs.     Objective:   Physical Exam BP 124/74  Pulse 85  Temp(Src) 98.1 F (36.7 C) (Oral)  Resp 16  Ht 5' 10.5" (1.791 m)  Wt 316 lb 6 oz (143.507 kg)  BMI 44.74 kg/m2  SpO2 97%  General Appearance:    Alert, cooperative, no distress, appears stated age, morbid obesity  Head:    Normocephalic, without obvious abnormality, atraumatic  Eyes:    PERRL, conjunctiva/corneas clear, EOM's intact, fundi    benign, both eyes       Ears:    Normal TM's and external ear canals, both ears  Nose:   Nares normal, septum midline, mucosa normal, no drainage   or sinus tenderness  Throat:   Lips, mucosa, and tongue normal; teeth and gums normal  Neck:   Supple, symmetrical, trachea midline, no adenopathy;       thyroid:  No enlargement/tenderness/nodules  Back:     Symmetric, no curvature, ROM normal, no CVA  tenderness  Lungs:     Clear to auscultation bilaterally, respirations unlabored  Chest wall:    No tenderness or deformity  Heart:    Regular rate and rhythm, S1 and S2 normal, no murmur, rub   or gallop  Abdomen:     Soft, non-tender, bowel sounds active all four quadrants,    no masses, no organomegaly  Genitalia:    Normal male without lesion, discharge or tenderness  Rectal:    Deferred due to young age  Extremities:   Extremities normal, atraumatic, no cyanosis or edema  Pulses:   2+ and symmetric all extremities  Skin:   Skin color, texture, turgor normal, no rashes or lesions  Lymph nodes:   Cervical, supraclavicular, and axillary nodes normal  Neurologic:   CNII-XII intact. Normal strength, sensation and reflexes      throughout          Assessment & Plan:

## 2013-05-28 NOTE — Progress Notes (Signed)
Pre visit review using our clinic review tool, if applicable. No additional management support is needed unless otherwise documented below in the visit note. 

## 2013-05-28 NOTE — Patient Instructions (Signed)
Follow up in 3-4 months to recheck diabetes Schedule your eye exam We'll notify you of your lab results and make any changes if needed Try and make healthy food choices and get regular exercise Start the Protonix daily for the reflux Start daily Zyrtec (store brand works just as well) and Nasacort nasal spray if needed Call with any questions or concerns Happy Early Rudene Anda!

## 2013-05-28 NOTE — Assessment & Plan Note (Addendum)
Pt has not been compliant w/ meds or f/u.  Overdue for eye exam- plans to schedule.  On ACE for renal protection.  Check labs.  Adjust meds prn

## 2013-05-31 ENCOUNTER — Encounter: Payer: Self-pay | Admitting: Internal Medicine

## 2013-06-01 NOTE — Telephone Encounter (Signed)
Unable to reach pre visit.  

## 2013-06-02 ENCOUNTER — Telehealth: Payer: Self-pay

## 2013-06-02 NOTE — Telephone Encounter (Signed)
Relevant patient education assigned to patient using Emmi. ° °

## 2013-06-04 ENCOUNTER — Other Ambulatory Visit: Payer: Self-pay | Admitting: Family Medicine

## 2013-06-04 NOTE — Telephone Encounter (Signed)
Med filled.  

## 2013-07-12 ENCOUNTER — Ambulatory Visit (INDEPENDENT_AMBULATORY_CARE_PROVIDER_SITE_OTHER): Payer: BC Managed Care – PPO | Admitting: Internal Medicine

## 2013-07-12 ENCOUNTER — Encounter: Payer: Self-pay | Admitting: Internal Medicine

## 2013-07-12 VITALS — BP 102/70 | HR 95 | Ht 70.5 in | Wt 317.5 lb

## 2013-07-12 DIAGNOSIS — R143 Flatulence: Secondary | ICD-10-CM

## 2013-07-12 DIAGNOSIS — R140 Abdominal distension (gaseous): Secondary | ICD-10-CM

## 2013-07-12 DIAGNOSIS — R141 Gas pain: Secondary | ICD-10-CM

## 2013-07-12 DIAGNOSIS — K219 Gastro-esophageal reflux disease without esophagitis: Secondary | ICD-10-CM

## 2013-07-12 DIAGNOSIS — R142 Eructation: Secondary | ICD-10-CM

## 2013-07-12 NOTE — Progress Notes (Signed)
         Subjective:    Patient ID: Victor Estrada, male    DOB: Sep 09, 1970, 43 y.o.   MRN: [id number]  HPI This is a pleasant middle-aged wm with DM-II and years of heartburn (about 3-5) treated with OTC meds and now pantoprazole 40 mg qd x 6 weeks. He says heartburn is well-controlled now but he still has gas pressure in left chest with some belching and relief but is not always able to get relief. He has lost 35# in 6 months with intent. Has some "early eye changes of diabetes but is overdue for f/u). Has ? Of some early neuropathy. In past year Hgb A1 c down to 6.8 fr 9+.  No dysphagia, anemia or bleeding.  Medications, allergies, past medical history, past surgical history, family history and social history are reviewed and updated in the EMR.   Review of Systems As per HPI all other RS negative    Objective:   Physical Exam General:  Well-developed, well-nourished and in no acute distress - morbidly obese Eyes:  anicteric. ENT:   Mouth and posterior pharynx - fractured left lower molar with firm nodule palpable left cheek Neck:   supple w/o thyromegaly or mass.  Lungs: Clear to auscultation bilaterally. Heart:  S1S2, no rubs, murmurs, gallops. Abdomen:  obese, soft, non-tender, no hepatosplenomegaly, hernia, or mass and BS+. No splash Lymph:  no cervical or supraclavicular adenopathy. Extremities:   no edema Skin   no rash. Neuro:  A&O x 3.  Psych:  appropriate mood and  Affect.   Data Reviewed: PCP notes, labs    Assessment & Plan:  GERD (gastroesophageal reflux disease)  gas pain/belching  1. Continue PPI 2. Continue weight loss and consider bariatric surgery  3. Gas prevention and GERD diets given 4. If still having belching and gas pains after 2 months likely EGD - he can call back  The risks and benefits as well as alternatives of endoscopic procedure(s) have been discussed and reviewed. All questions answered. The patient agrees to proceed.  TI:[id number]  Birdie Riddle, MD

## 2013-07-12 NOTE — Patient Instructions (Signed)
Today you have been given a gas handout to read and follow.  Continue losing weight.  Call us back in 2 months with an update on how your doing.    I appreciate the opportunity to care for you.

## 2013-07-25 ENCOUNTER — Other Ambulatory Visit: Payer: Self-pay | Admitting: Family Medicine

## 2013-07-26 NOTE — Telephone Encounter (Signed)
Med filled.  

## 2013-08-01 ENCOUNTER — Other Ambulatory Visit: Payer: Self-pay | Admitting: Family Medicine

## 2013-08-02 NOTE — Telephone Encounter (Signed)
Med filled.  

## 2013-09-10 ENCOUNTER — Ambulatory Visit: Payer: BC Managed Care – PPO | Admitting: Family Medicine

## 2013-09-14 ENCOUNTER — Ambulatory Visit (INDEPENDENT_AMBULATORY_CARE_PROVIDER_SITE_OTHER): Payer: BC Managed Care – PPO | Admitting: Family Medicine

## 2013-09-14 ENCOUNTER — Encounter: Payer: Self-pay | Admitting: Family Medicine

## 2013-09-14 VITALS — BP 124/82 | HR 84 | Temp 97.9°F | Resp 17 | Wt 316.4 lb

## 2013-09-14 DIAGNOSIS — IMO0002 Reserved for concepts with insufficient information to code with codable children: Secondary | ICD-10-CM

## 2013-09-14 DIAGNOSIS — IMO0001 Reserved for inherently not codable concepts without codable children: Secondary | ICD-10-CM

## 2013-09-14 DIAGNOSIS — E1165 Type 2 diabetes mellitus with hyperglycemia: Secondary | ICD-10-CM

## 2013-09-14 LAB — BASIC METABOLIC PANEL
BUN: 18 mg/dL (ref 6–23)
CO2: 26 mEq/L (ref 19–32)
Calcium: 8.9 mg/dL (ref 8.4–10.5)
Chloride: 105 mEq/L (ref 96–112)
Creatinine, Ser: 0.9 mg/dL (ref 0.4–1.5)
GFR: 100.8 mL/min (ref >60.00)
Glucose, Bld: 90 mg/dL (ref 70–99)
Potassium: 4.1 mEq/L (ref 3.5–5.1)
Sodium: 139 mEq/L (ref 135–145)

## 2013-09-14 LAB — HEMOGLOBIN A1C: Hgb A1c MFr Bld: 6.6 % — ABNORMAL HIGH (ref 4.6–6.5)

## 2013-09-14 NOTE — Assessment & Plan Note (Signed)
Chronic problem.  Pt is tolerating meds w/o difficulty and currently asymptomatic.  Check labs.  Adjust meds prn.  Encouraged him to schedule eye exam.  Pt in agreement.  Will follow closely.

## 2013-09-14 NOTE — Progress Notes (Signed)
Pre visit review using our clinic review tool, if applicable. No additional management support is needed unless otherwise documented below in the visit note. 

## 2013-09-14 NOTE — Progress Notes (Signed)
   Subjective:    Patient ID: Victor Estrada, male    DOB: 1970/11/03, 43 y.o.   MRN: [id number]  HPI DM- chronic problem, on Janumet and amaryl.  On Lisinopril daily for renal protection.  Was in [place] last month and eating habits were different- drank a lot more sodas.  Not checking sugars.  Will have symptomatic lows after prolonged walking.  No CP, SOB, HAs, visual changes, edema, numbness/tingling hands/feet, nausea/vomiting/diarrhea.  Due for eye exam.   Review of Systems For ROS see HPI     Objective:   Physical Exam  Vitals reviewed. Constitutional: He is oriented to person, place, and time. He appears well-developed and well-nourished. No distress.  obese  HENT:  Head: Normocephalic and atraumatic.  Eyes: Conjunctivae and EOM are normal. Pupils are equal, round, and reactive to light.  Neck: Normal range of motion. Neck supple. No thyromegaly present.  Cardiovascular: Normal rate, regular rhythm, normal heart sounds and intact distal pulses.   No murmur heard. Pulmonary/Chest: Effort normal and breath sounds normal. No respiratory distress.  Abdominal: Soft. Bowel sounds are normal. He exhibits no distension.  Musculoskeletal: He exhibits no edema.  Lymphadenopathy:    He has no cervical adenopathy.  Neurological: He is alert and oriented to person, place, and time. No cranial nerve deficit.  Skin: Skin is warm and dry.  Psychiatric: He has a normal mood and affect. His behavior is normal.          Assessment & Plan:

## 2013-09-14 NOTE — Patient Instructions (Signed)
Follow up in 3-4 months to recheck diabetes We'll notify you of your lab results and make any changes if needed Try and make healthy choices and get regular exercise Call with any questions or concerns Have a great summer!!!

## 2013-10-13 ENCOUNTER — Other Ambulatory Visit: Payer: Self-pay | Admitting: Family Medicine

## 2013-10-13 NOTE — Telephone Encounter (Signed)
Med filled.  

## 2013-10-20 ENCOUNTER — Other Ambulatory Visit: Payer: Self-pay | Admitting: Family Medicine

## 2013-10-20 NOTE — Telephone Encounter (Signed)
Med filled.  

## 2013-11-11 ENCOUNTER — Telehealth: Payer: Self-pay | Admitting: Family Medicine

## 2013-11-11 ENCOUNTER — Encounter: Payer: Self-pay | Admitting: Family Medicine

## 2013-11-11 ENCOUNTER — Encounter (HOSPITAL_COMMUNITY): Payer: Self-pay | Admitting: *Deleted

## 2013-11-11 ENCOUNTER — Ambulatory Visit (INDEPENDENT_AMBULATORY_CARE_PROVIDER_SITE_OTHER): Payer: BC Managed Care – PPO | Admitting: Family Medicine

## 2013-11-11 ENCOUNTER — Ambulatory Visit: Payer: BC Managed Care – PPO | Admitting: Physician Assistant

## 2013-11-11 VITALS — BP 118/78 | HR 108 | Temp 97.9°F | Resp 16 | Wt 318.0 lb

## 2013-11-11 DIAGNOSIS — L0201 Cutaneous abscess of face: Secondary | ICD-10-CM | POA: Insufficient documentation

## 2013-11-11 DIAGNOSIS — L03211 Cellulitis of face: Principal | ICD-10-CM

## 2013-11-11 MED ORDER — DOXYCYCLINE HYCLATE 100 MG PO TABS: 100.0000 mg | ORAL_TABLET | Freq: Two times a day (BID) | ORAL | Status: DC

## 2013-11-11 NOTE — Telephone Encounter (Signed)
Left a message for call back.  

## 2013-11-11 NOTE — Telephone Encounter (Signed)
Pt states he believes he has a sebaceous cyst on the left side of face at the jaw line extending to left side of neck.  States the area is reddened, swollen and tender to touch.  Denies fever, but states that he's starting not to feel well.  Hx of cyst.  Dr. Birdie Riddle has an appointment available today at 2 pm.  Pt states he lives 5 mins away.  Appointment was booked and patient was encouraged to come in.  Pt stated he was on his way.

## 2013-11-11 NOTE — Telephone Encounter (Signed)
Please follow up. Pt does not have an appt in [facility] today it was cancelled. He is on tabori's schedule for 9/23 for a follow up.

## 2013-11-11 NOTE — Telephone Encounter (Signed)
CAN: 11/11/13 pt had a tooth abstracted in May. Pt jaw is swollen advised by dentist to follow up with PCP.

## 2013-11-11 NOTE — Patient Instructions (Signed)
Start the Doxycycline twice daily- take w/ food Apply hot compresses to face Go to ENT as directed for evaluation and treatment Call with any questions or concerns Hang in there!!!

## 2013-11-11 NOTE — Progress Notes (Signed)
Pre visit review using our clinic review tool, if applicable. No additional management support is needed unless otherwise documented below in the visit note. 

## 2013-11-11 NOTE — Progress Notes (Signed)
   Subjective:    Patient ID: Victor Estrada, male    DOB: [birth year], 43 y.o.   MRN: [id number]  HPI L facial redness, swelling.  Had abscessed tooth pulled in May and after a month, cheek started swelling.  Oral surgeon placed him on abx- no improvement.  Over the last month, knot has had palpable 'connecting tube' to just below jaw 'which finally broke through'.  Pt has hx of similar on R side and had infected sebaceous cyst.   Review of Systems For ROS see HPI     Objective:   Physical Exam  Constitutional: He appears well-developed and well-nourished. No distress.  Skin: Skin is warm and dry. There is erythema (pt w/ erythema and induration of L cheek, extending in firm tract down to jawline and extending below into neck- here w/ some fluctuance).          Assessment & Plan:

## 2013-11-11 NOTE — Telephone Encounter (Signed)
Patient Information:  Caller Name: Jonelle Sidle  Phone: [phone]  Patient: Victor Estrada, Victor Estrada  Gender: Male  DOB: 1971/01/09  Age: 43 Years  PCP: Midge Minium.  Office Follow Up:  Does the office need to follow up with this patient?: No  Instructions For The Office: N/A  RN Note:  Dentist prescribed an antibiotic but advised him to wait until he sees his PCP today to start on it. Has an appt. at the [facility] office at 15:30.  Symptoms  Reason For Call & Symptoms: In May, 2015, had tooth extraction after abcess. Now having swelling on the left side of face at the jaw line. Went into see the Dentist this morning(11/11/13) and the Dentist cleared him as far as any infection in the mouth. Area is reddened and sore to touch. States many years ago he had a cyst there that became infected. Thinks it is the same problem now. Dentist advised him to see his PCP.  Reviewed Health History In EMR: Yes  Reviewed Medications In EMR: Yes  Reviewed Allergies In EMR: Yes  Reviewed Surgeries / Procedures: Yes  Date of Onset of Symptoms: 11/08/2013  Guideline(s) Used:  Face Swelling  Disposition Per Guideline:   See Today in Office  Reason For Disposition Reached:   Swelling is painful to touch  Advice Given:  Call Back If  You become worse.  Patient Will Follow Care Advice:  YES

## 2013-11-12 ENCOUNTER — Ambulatory Visit (HOSPITAL_COMMUNITY): Payer: BC Managed Care – PPO | Admitting: Anesthesiology

## 2013-11-12 ENCOUNTER — Ambulatory Visit (HOSPITAL_COMMUNITY): Payer: BC Managed Care – PPO

## 2013-11-12 ENCOUNTER — Encounter (HOSPITAL_COMMUNITY): Payer: BC Managed Care – PPO | Admitting: Anesthesiology

## 2013-11-12 ENCOUNTER — Encounter (HOSPITAL_COMMUNITY)
Admission: RE | Disposition: A | Discharge status: Home / Self Care | Payer: Self-pay | Source: Ambulatory Visit | Attending: Otolaryngology

## 2013-11-12 ENCOUNTER — Ambulatory Visit (HOSPITAL_COMMUNITY)
Admission: RE | Admit: 2013-11-12 | Discharge: 2013-11-12 | Disposition: A | Discharge status: Home / Self Care | Payer: BC Managed Care – PPO | Source: Ambulatory Visit | Attending: Otolaryngology | Admitting: Otolaryngology

## 2013-11-12 ENCOUNTER — Encounter (HOSPITAL_COMMUNITY): Payer: Self-pay | Admitting: *Deleted

## 2013-11-12 DIAGNOSIS — Z79899 Other long term (current) drug therapy: Secondary | ICD-10-CM | POA: Insufficient documentation

## 2013-11-12 DIAGNOSIS — L0201 Cutaneous abscess of face: Secondary | ICD-10-CM | POA: Diagnosis not present

## 2013-11-12 DIAGNOSIS — Z7982 Long term (current) use of aspirin: Secondary | ICD-10-CM | POA: Insufficient documentation

## 2013-11-12 DIAGNOSIS — K219 Gastro-esophageal reflux disease without esophagitis: Secondary | ICD-10-CM | POA: Insufficient documentation

## 2013-11-12 DIAGNOSIS — E785 Hyperlipidemia, unspecified: Secondary | ICD-10-CM | POA: Diagnosis not present

## 2013-11-12 DIAGNOSIS — F329 Major depressive disorder, single episode, unspecified: Secondary | ICD-10-CM | POA: Insufficient documentation

## 2013-11-12 DIAGNOSIS — L03211 Cellulitis of face: Principal | ICD-10-CM

## 2013-11-12 DIAGNOSIS — I10 Essential (primary) hypertension: Secondary | ICD-10-CM | POA: Insufficient documentation

## 2013-11-12 DIAGNOSIS — F411 Generalized anxiety disorder: Secondary | ICD-10-CM | POA: Insufficient documentation

## 2013-11-12 DIAGNOSIS — E119 Type 2 diabetes mellitus without complications: Secondary | ICD-10-CM | POA: Diagnosis not present

## 2013-11-12 DIAGNOSIS — F3289 Other specified depressive episodes: Secondary | ICD-10-CM | POA: Insufficient documentation

## 2013-11-12 HISTORY — DX: Anxiety disorder, unspecified: F41.9

## 2013-11-12 HISTORY — DX: Unspecified injury of head, initial encounter: S09.90XA

## 2013-11-12 HISTORY — DX: Adverse effect of unspecified anesthetic, initial encounter: T41.45XA

## 2013-11-12 HISTORY — DX: Nausea with vomiting, unspecified: R11.2

## 2013-11-12 HISTORY — PX: INCISION AND DRAINAGE ABSCESS: SHX5864

## 2013-11-12 HISTORY — DX: Other specified postprocedural states: Z98.890

## 2013-11-12 HISTORY — DX: Essential (primary) hypertension: I10

## 2013-11-12 HISTORY — DX: Shortness of breath: R06.02

## 2013-11-12 HISTORY — DX: Constipation, unspecified: K59.00

## 2013-11-12 HISTORY — DX: Calculus of kidney: N20.0

## 2013-11-12 HISTORY — DX: Unspecified disorder of nose and nasal sinuses: J34.9

## 2013-11-12 HISTORY — DX: Other complications of anesthesia, initial encounter: T88.59XA

## 2013-11-12 LAB — GRAM STAIN

## 2013-11-12 LAB — GLUCOSE, CAPILLARY
Glucose-Capillary: 175 mg/dL — ABNORMAL HIGH (ref 70–99)
Glucose-Capillary: 146 mg/dL — ABNORMAL HIGH (ref 70–99)

## 2013-11-12 LAB — BASIC METABOLIC PANEL
Anion gap: 13 (ref 5–15)
BUN: 15 mg/dL (ref 6–23)
CO2: 22 mEq/L (ref 19–32)
Calcium: 9 mg/dL (ref 8.4–10.5)
Chloride: 102 mEq/L (ref 96–112)
Creatinine, Ser: 0.79 mg/dL (ref 0.50–1.35)
GFR calc Af Amer: >90 mL/min (ref >90)
GFR calc non Af Amer: >90 mL/min (ref >90)
Glucose, Bld: 187 mg/dL — ABNORMAL HIGH (ref 70–99)
Potassium: 4.2 mEq/L (ref 3.7–5.3)
Sodium: 137 mEq/L (ref 137–147)

## 2013-11-12 LAB — CBC
HCT: 39.2 % (ref 39.0–52.0)
Hemoglobin: 12.7 g/dL — ABNORMAL LOW (ref 13.0–17.0)
MCH: 26.8 pg (ref 26.0–34.0)
MCHC: 32.4 g/dL (ref 30.0–36.0)
MCV: 82.9 fL (ref 78.0–100.0)
Platelets: 307 10*3/uL (ref 150–400)
RBC: 4.73 MIL/uL (ref 4.22–5.81)
RDW: 13.6 % (ref 11.5–15.5)
WBC: 10.3 10*3/uL (ref 4.0–10.5)

## 2013-11-12 SURGERY — INCISION AND DRAINAGE, ABSCESS
Anesthesia: General | Site: Face | Laterality: Left

## 2013-11-12 MED ORDER — ONDANSETRON HCL 4 MG/2ML IJ SOLN
INTRAMUSCULAR | Status: DC | PRN
  Administered 2013-11-12: 4 mg via INTRAVENOUS

## 2013-11-12 MED ORDER — PROPOFOL 10 MG/ML IV BOLUS
INTRAVENOUS | Status: AC
  Filled 2013-11-12: qty 20

## 2013-11-12 MED ORDER — FENTANYL CITRATE 0.05 MG/ML IJ SOLN
INTRAMUSCULAR | Status: DC | PRN
  Administered 2013-11-12: 50 ug via INTRAVENOUS
  Administered 2013-11-12: 100 ug via INTRAVENOUS

## 2013-11-12 MED ORDER — ROCURONIUM BROMIDE 100 MG/10ML IV SOLN
INTRAVENOUS | Status: DC | PRN
  Administered 2013-11-12: 50 mg via INTRAVENOUS

## 2013-11-12 MED ORDER — LIDOCAINE HCL (CARDIAC) 20 MG/ML IV SOLN
INTRAVENOUS | Status: DC | PRN
  Administered 2013-11-12: 80 mg via INTRAVENOUS

## 2013-11-12 MED ORDER — MIDAZOLAM HCL 2 MG/2ML IJ SOLN
INTRAMUSCULAR | Status: AC
  Filled 2013-11-12: qty 2

## 2013-11-12 MED ORDER — LACTATED RINGERS IV SOLN: INTRAVENOUS | Status: DC

## 2013-11-12 MED ORDER — PROMETHAZINE HCL 25 MG/ML IJ SOLN: 6.2500 mg | INTRAMUSCULAR | Status: DC | PRN

## 2013-11-12 MED ORDER — LIDOCAINE-EPINEPHRINE 1 %-1:100000 IJ SOLN
INTRAMUSCULAR | Status: DC | PRN
  Administered 2013-11-12: 30 mL

## 2013-11-12 MED ORDER — SODIUM CHLORIDE 0.9 % IR SOLN
Status: DC | PRN
  Administered 2013-11-12: 1000 mL

## 2013-11-12 MED ORDER — OXYCODONE HCL 5 MG PO TABS: 5.0000 mg | ORAL_TABLET | Freq: Once | ORAL | Status: DC | PRN

## 2013-11-12 MED ORDER — FENTANYL CITRATE 0.05 MG/ML IJ SOLN
INTRAMUSCULAR | Status: AC
  Filled 2013-11-12: qty 5

## 2013-11-12 MED ORDER — ARTIFICIAL TEARS OP OINT
TOPICAL_OINTMENT | OPHTHALMIC | Status: AC
  Filled 2013-11-12: qty 3.5

## 2013-11-12 MED ORDER — ARTIFICIAL TEARS OP OINT
TOPICAL_OINTMENT | OPHTHALMIC | Status: DC | PRN
  Administered 2013-11-12: 1 via OPHTHALMIC

## 2013-11-12 MED ORDER — PROPOFOL 10 MG/ML IV BOLUS
INTRAVENOUS | Status: DC | PRN
  Administered 2013-11-12: 200 mg via INTRAVENOUS

## 2013-11-12 MED ORDER — SUCCINYLCHOLINE CHLORIDE 20 MG/ML IJ SOLN
INTRAMUSCULAR | Status: DC | PRN
  Administered 2013-11-12: 100 mg via INTRAVENOUS

## 2013-11-12 MED ORDER — MIDAZOLAM HCL 5 MG/5ML IJ SOLN
INTRAMUSCULAR | Status: DC | PRN
  Administered 2013-11-12: 2 mg via INTRAVENOUS

## 2013-11-12 MED ORDER — ONDANSETRON HCL 4 MG/2ML IJ SOLN
INTRAMUSCULAR | Status: AC
  Filled 2013-11-12: qty 2

## 2013-11-12 MED ORDER — OXYCODONE HCL 5 MG/5ML PO SOLN: 5.0000 mg | Freq: Once | ORAL | Status: DC | PRN

## 2013-11-12 MED ORDER — CLINDAMYCIN PHOSPHATE 600 MG/50ML IV SOLN
600.0000 mg | INTRAVENOUS | Status: DC
  Filled 2013-11-12: qty 50

## 2013-11-12 MED ORDER — SULFAMETHOXAZOLE-TMP DS 800-160 MG PO TABS: 1.0000 | ORAL_TABLET | Freq: Two times a day (BID) | ORAL | Status: DC

## 2013-11-12 MED ORDER — HYDROMORPHONE HCL PF 1 MG/ML IJ SOLN: 0.2500 mg | INTRAMUSCULAR | Status: DC | PRN

## 2013-11-12 MED ORDER — LACTATED RINGERS IV SOLN
INTRAVENOUS | Status: DC | PRN
  Administered 2013-11-12: 10:00:00 via INTRAVENOUS

## 2013-11-12 MED ORDER — LIDOCAINE HCL (CARDIAC) 20 MG/ML IV SOLN
INTRAVENOUS | Status: AC
  Filled 2013-11-12: qty 5

## 2013-11-12 MED ORDER — LIDOCAINE-EPINEPHRINE 1 %-1:100000 IJ SOLN
INTRAMUSCULAR | Status: AC
  Filled 2013-11-12: qty 1

## 2013-11-12 MED ORDER — CLINDAMYCIN PHOSPHATE 600 MG/50ML IV SOLN
600.0000 mg | Freq: Once | INTRAVENOUS | Status: AC
  Administered 2013-11-12: 600 mg via INTRAVENOUS

## 2013-11-12 SURGICAL SUPPLY — 34 items
BNDG CONFORM 2 STRL LF (GAUZE/BANDAGES/DRESSINGS) ×2 IMPLANT
BNDG GAUZE ELAST 4 BULKY (GAUZE/BANDAGES/DRESSINGS) ×1 IMPLANT
CATH ROBINSON RED A/P 12FR (CATHETERS) ×2 IMPLANT
COVER SURGICAL LIGHT HANDLE (MISCELLANEOUS) ×4 IMPLANT
CRADLE DONUT ADULT HEAD (MISCELLANEOUS) ×1 IMPLANT
DRAIN PENROSE 1/4X12 LTX STRL (WOUND CARE) ×2 IMPLANT
DRAPE ORTHO SPLIT 77X108 STRL (DRAPES) ×2
DRAPE SURG ORHT 6 SPLT 77X108 (DRAPES) ×1 IMPLANT
ELECT COATED BLADE 2.86 ST (ELECTRODE) ×2 IMPLANT
ELECT REM PT RETURN 9FT ADLT (ELECTROSURGICAL) ×2
ELECTRODE REM PT RTRN 9FT ADLT (ELECTROSURGICAL) ×1 IMPLANT
GAUZE SPONGE 4X4 16PLY XRAY LF (GAUZE/BANDAGES/DRESSINGS) ×2 IMPLANT
GLOVE BIO SURGEON STRL SZ7.5 (GLOVE) ×3 IMPLANT
GLOVE BIOGEL PI IND STRL 7.0 (GLOVE) IMPLANT
GLOVE BIOGEL PI INDICATOR 7.0 (GLOVE) ×1
GLOVE SURG SS PI 7.0 STRL IVOR (GLOVE) ×1 IMPLANT
GOWN STRL REUS W/ TWL LRG LVL3 (GOWN DISPOSABLE) ×1 IMPLANT
GOWN STRL REUS W/TWL LRG LVL3 (GOWN DISPOSABLE) ×2
KIT BASIN OR (CUSTOM PROCEDURE TRAY) ×2 IMPLANT
KIT ROOM TURNOVER OR (KITS) ×2 IMPLANT
MARKER SKIN DUAL TIP RULER LAB (MISCELLANEOUS) ×2 IMPLANT
NDL HYPO 25GX1X1/2 BEV (NEEDLE) ×1 IMPLANT
NEEDLE HYPO 25GX1X1/2 BEV (NEEDLE) ×2 IMPLANT
NS IRRIG 1000ML POUR BTL (IV SOLUTION) ×2 IMPLANT
PACK SURGICAL SETUP 50X90 (CUSTOM PROCEDURE TRAY) ×2 IMPLANT
PAD ARMBOARD 7.5X6 YLW CONV (MISCELLANEOUS) ×4 IMPLANT
PENCIL BUTTON HOLSTER BLD 10FT (ELECTRODE) ×2 IMPLANT
SUT ETHILON 2 0 FS 18 (SUTURE) ×2 IMPLANT
SWAB COLLECTION DEVICE MRSA (MISCELLANEOUS) ×2 IMPLANT
SYR BULB IRRIGATION 50ML (SYRINGE) ×2 IMPLANT
SYR CONTROL 10ML LL (SYRINGE) ×1 IMPLANT
TUBE ANAEROBIC SPECIMEN COL (MISCELLANEOUS) ×2 IMPLANT
TUBE CONNECTING 12X1/4 (SUCTIONS) ×2 IMPLANT
YANKAUER SUCT BULB TIP NO VENT (SUCTIONS) ×2 IMPLANT

## 2013-11-12 NOTE — Discharge Instructions (Signed)

## 2013-11-12 NOTE — Transfer of Care (Signed)
Immediate Anesthesia Transfer of Care Note  Patient: Victor Estrada  Procedure(s) Performed: Procedure(s): INCISION AND DRAINAGE LEFT CHEEK  ABSCESS (Left)  Patient Location: PACU  Anesthesia Type:General  Level of Consciousness: awake, alert , oriented and patient cooperative  Airway & Oxygen Therapy: Patient Spontanous Breathing and Patient connected to face mask oxygen  Post-op Assessment: Report given to PACU RN, Post -op Vital signs reviewed and stable and Patient moving all extremities  Post vital signs: Reviewed and stable  Complications: No apparent anesthesia complications

## 2013-11-12 NOTE — Brief Op Note (Signed)
11/12/2013  10:16 AM  PATIENT:  Bradly Bienenstock Dedeaux  43 y.o. male  PRE-OPERATIVE DIAGNOSIS:  LEFT CHEEK  ABSCESS   POST-OPERATIVE DIAGNOSIS:  LEFT CHEEK  ABSCESS   PROCEDURE:  Procedure(s): INCISION AND DRAINAGE LEFT CHEEK  ABSCESS (Left)  SURGEON:  Surgeon(s) and Role:    * Melida Quitter, MD - Primary  PHYSICIAN ASSISTANT:   ASSISTANTS: none   ANESTHESIA:   general  EBL:     BLOOD ADMINISTERED:none  DRAINS: Penrose drain in the left submandibular area   LOCAL MEDICATIONS USED:  LIDOCAINE   SPECIMEN:  Source of Specimen:  Left cheek abscess culture  DISPOSITION OF SPECIMEN:  MICRO  COUNTS:  YES  TOURNIQUET:  * No tourniquets in log *  DICTATION: .Other Dictation: Dictation Number [id number]  PLAN OF CARE: Discharge to home after PACU  PATIENT DISPOSITION:  PACU - hemodynamically stable.   Delay start of Pharmacological VTE agent (>24hrs) due to surgical blood loss or risk of bleeding: no

## 2013-11-12 NOTE — H&P (Signed)
Victor Estrada is an 43 y.o. male.   Chief Complaint: left cheek abscess HPI: 43 year old male with left lower molar removed in May.  About one month later, he started developing swelling in the left cheek that has had marginal response to a couple of antibiotic courses.  The area of swelling has been painful and has spread inferiorly and has become fluctuant.  He presents for surgical drainage.  Past Medical History  Diagnosis Date  . Diabetes mellitus   . Hyperlipidemia   . GERD (gastroesophageal reflux disease)   . Depression   . Complication of anesthesia   . PONV (postoperative nausea and vomiting)     post wisdom teeth  . Hypertension   . Head injury, acute, without loss of consciousness   . Shortness of breath     with exertion  . Anxiety   . Kidney stone   . Constipation   . Sinus disorder     Past Surgical History  Procedure Laterality Date  . Dental surgery    . Wisdom tooth extraction      Family History  Problem Relation Age of Onset  . Hyperlipidemia Father   . Heart disease Father   . Diabetes Father   . Heart disease Maternal Grandmother   . Diabetes Paternal Grandmother   . Cancer Mother    Social History:  reports that he has never smoked. He has never used smokeless tobacco. He reports that he does not drink alcohol or use illicit drugs.  Allergies: No Known Allergies  Medications Prior to Admission  Medication Sig Dispense Refill  . acetaminophen (TYLENOL) 500 MG tablet Take 1,000 mg by mouth every 6 (six) hours as needed (for pain).       Marland Kitchen aspirin EC 81 MG tablet Take 162 mg by mouth daily.      . DULoxetine (CYMBALTA) 60 MG capsule Take 60 mg by mouth daily.      . Fenofibrate 150 MG CAPS Take 150 mg by mouth daily.      Marland Kitchen glimepiride (AMARYL) 4 MG tablet TAKE 1 TABLET BY MOUTH TWICE DAILY  60 tablet  6  . glimepiride (AMARYL) 4 MG tablet Take 4 mg by mouth 2 (two) times daily.      Marland Kitchen lisinopril (PRINIVIL,ZESTRIL) 20 MG tablet TAKE 1 TABLET BY  MOUTH EVERY DAY  90 tablet  1  . Multiple Vitamin (MULTIVITAMIN) tablet Take 1 tablet by mouth daily.      . pantoprazole (PROTONIX) 40 MG tablet Take 40 mg by mouth daily.      . simvastatin (ZOCOR) 20 MG tablet Take 20 mg by mouth daily.      . sitaGLIPtin-metformin (JANUMET) 50-1000 MG per tablet Take 1 tablet by mouth 2 (two) times daily with a meal.      . doxycycline (VIBRA-TABS) 100 MG tablet Take 1 tablet (100 mg total) by mouth 2 (two) times daily.  20 tablet  0  . Fenofibrate 150 MG CAPS TAKE 1 CAPSULE BY MOUTH EVERY DAY  90 capsule  0    Results for orders placed during the hospital encounter of 11/12/13 (from the past 48 hour(s))  BASIC METABOLIC PANEL     Status: Abnormal   Collection Time    11/12/13  7:39 AM      Result Value Ref Range   Sodium 137  137 - 147 mEq/L   Potassium 4.2  3.7 - 5.3 mEq/L   Chloride 102  96 - 112 mEq/L  CO2 22  19 - 32 mEq/L   Glucose, Bld 187 (*) 70 - 99 mg/dL   BUN 15  6 - 23 mg/dL   Creatinine, Ser 0.79  0.50 - 1.35 mg/dL   Calcium 9.0  8.4 - 10.5 mg/dL   GFR calc non Af Amer >90  >90 mL/min   GFR calc Af Amer >90  >90 mL/min   Comment: (NOTE)     The eGFR has been calculated using the CKD EPI equation.     This calculation has not been validated in all clinical situations.     eGFR's persistently <90 mL/min signify possible Chronic Kidney     Disease.   Anion gap 13  5 - 15  CBC     Status: Abnormal   Collection Time    11/12/13  7:39 AM      Result Value Ref Range   WBC 10.3  4.0 - 10.5 K/uL   RBC 4.73  4.22 - 5.81 MIL/uL   Hemoglobin 12.7 (*) 13.0 - 17.0 g/dL   HCT 39.2  39.0 - 52.0 %   MCV 82.9  78.0 - 100.0 fL   MCH 26.8  26.0 - 34.0 pg   MCHC 32.4  30.0 - 36.0 g/dL   RDW 13.6  11.5 - 15.5 %   Platelets 307  150 - 400 K/uL  GLUCOSE, CAPILLARY     Status: Abnormal   Collection Time    11/12/13  7:41 AM      Result Value Ref Range   Glucose-Capillary 175 (*) 70 - 99 mg/dL   Dg Chest 2 View  11/12/2013   CLINICAL DATA:   Hypertension.  EXAM: CHEST  2 VIEW  COMPARISON:  None.  FINDINGS: Heart size and mediastinal contours are within normal limits. Both lungs are clear. Visualized skeletal structures are unremarkable.  IMPRESSION: Negative exam.   Electronically Signed   By: Inge Rise M.D.   On: 11/12/2013 07:45    Review of Systems  Constitutional: Positive for malaise/fatigue.  All other systems reviewed and are negative.   Blood pressure 120/68, pulse 97, temperature 98 F (36.7 C), temperature source Oral, resp. rate 18, height _0  (1.778 m), weight 144.244 kg (318 lb), SpO2 97.00%. Physical Exam  Constitutional: He is oriented to person, place, and time. He appears well-developed and well-nourished. No distress.  HENT:  Head: Normocephalic and atraumatic.  Right Ear: External ear normal.  Left Ear: External ear normal.  Nose: Nose normal.  Mouth/Throat: Oropharynx is clear and moist.  Left cheek with linear firmness extending down beyond mandible where it widens into an area of fluctuance.  Overlying skin erythema and flaking.  Eyes: Conjunctivae and EOM are normal. Pupils are equal, round, and reactive to light.  Neck: Normal range of motion. Neck supple.  Cardiovascular: Normal rate.   Respiratory: Effort normal.  Musculoskeletal: Normal range of motion.  Neurological: He is alert and oriented to person, place, and time. No cranial nerve deficit.  Skin: Skin is warm and dry.  Psychiatric: He has a normal mood and affect. His behavior is normal. Judgment and thought content normal.     Assessment/Plan Left cheek abscess To OR for incision and drainage.  Cal Gindlesperger 11/12/2013, 9:04 AM

## 2013-11-12 NOTE — Anesthesia Postprocedure Evaluation (Signed)
Anesthesia Post Note  Patient: Victor Estrada  Procedure(s) Performed: Procedure(s) (LRB): INCISION AND DRAINAGE LEFT CHEEK  ABSCESS (Left)  Anesthesia type: general  Patient location: PACU  Post pain: Pain level controlled  Post assessment: Patient's Cardiovascular Status Stable  Last Vitals:  Filed Vitals:   11/12/13 1100  BP: 142/88  Pulse: 96  Temp:   Resp: 18    Post vital signs: Reviewed and stable  Level of consciousness: sedated  Complications: No apparent anesthesia complications

## 2013-11-12 NOTE — Op Note (Signed)
NAMEHODGES, TREIBER                 ACCOUNT NO.:  [id number]  MEDICAL RECORD NO.:  [id number]  LOCATION:  [facility]                         FACILITY:  [facility]  PHYSICIAN:  Onnie Graham, MD     DATE OF BIRTH:  1970/09/01  DATE OF PROCEDURE:  11/12/2013 DATE OF DISCHARGE:                              OPERATIVE REPORT   PREOPERATIVE DIAGNOSIS:  Left cheek abscess.  POSTOPERATIVE DIAGNOSIS:  Left cheek abscess.  PROCEDURE:  Incision and drainage of left cheek abscess.  SURGEON:  Leane Para. Redmond Baseman, MD  ANESTHESIA:  General endotracheal anesthesia.  COMPLICATIONS:  None.  INDICATION:  The patient is a 43 year old male who underwent extraction of a left lower molar in May due to infection.  About 1 month later, he started developing swelling in the left cheek that did not respond to a course of oral antibiotics.  It seems to be separate from the molar extraction region but had worsened and now is fluctuant, inferior to the mandibular margin.  He presents to the operating room for surgical management.  FINDINGS:  There was a fluctuant area just below the margin of the mandible that is in continuity to a vertically oriented area firmness up into the left cheek.  Incision into the submandibular area yielded cloudy clear fluid with flecks of white debris.  Cultures were taken.  DESCRIPTION OF PROCEDURE:  The patient was identified in the holding room.  Informed consent having been obtained including discussion of risks, benefits, alternatives, the patient was brought to the operative suite, and put on the operative table in supine position.  Anesthesia was induced.  The patient was intubated by Anesthesia team without difficulty.  The eyes were taped closed and the left neck was prepped and draped in sterile fashion.  Incision was marked with a marking pen and injected with 1% lidocaine with 100,000 epinephrine just below the margin of the mandible.  The skin incision was made with a 15  blade scalpel and extended into the abscess using a hemostat.  Purulent fluid immediately drained and was cultured.  A hemostat was then used to dissect within the abscess to include loculations as well as to dissect into the firm area superiorly as well as around the fluctuant area in the submandibular region.  After this was completed, the abscess cavity was copiously irrigated with saline using a red rubber catheter.  The patient was given an intravenous dose of clindamycin at this point.  An quarter-inch Penrose drain was then placed in the depth of the abscess cavity and secured to the skin using 2-0 nylon suture.  Drapes were removed and the patient was cleaned off.  A Kerlix fluff dressing was placed around the neck. He was returned to Anesthesia for wake up, was extubated, and moved to recovery room in stable condition.     Onnie Graham, MD     DDB/MEDQ  D:  11/12/2013  T:  11/12/2013  Job:  [id number]

## 2013-11-12 NOTE — Anesthesia Preprocedure Evaluation (Signed)
Anesthesia Evaluation  Patient identified by MRN, date of birth, ID band Patient awake    Reviewed: Allergy & Precautions, H&P , NPO status , Patient's Chart, lab work & pertinent test results, reviewed documented beta blocker date and time   History of Anesthesia Complications (+) PONV and history of anesthetic complications  Airway Mallampati: III TM Distance: >3 FB Neck ROM: Full    Dental  (+) Teeth Intact, Dental Advisory Given   Pulmonary neg pulmonary ROS,    Pulmonary exam normal       Cardiovascular hypertension, Pt. on medications     Neuro/Psych PSYCHIATRIC DISORDERS Anxiety Depression negative neurological ROS     GI/Hepatic Neg liver ROS, GERD-  Medicated,  Endo/Other  diabetes  Renal/GU negative Renal ROS     Musculoskeletal   Abdominal   Peds  Hematology   Anesthesia Other Findings   Reproductive/Obstetrics                           Anesthesia Physical Anesthesia Plan  ASA: III  Anesthesia Plan: General   Post-op Pain Management:    Induction: Intravenous  Airway Management Planned: Oral ETT  Additional Equipment:   Intra-op Plan:   Post-operative Plan: Extubation in OR  Informed Consent:   Dental advisory given  Plan Discussed with: CRNA and Anesthesiologist  Anesthesia Plan Comments:         Anesthesia Quick Evaluation

## 2013-11-13 NOTE — Assessment & Plan Note (Signed)
New.  Severe.  Pt w/ cellulitis/abscess of L side of face extending from mid cheek down to jaw line and inferiorly into neck.  Start abx and refer to ENT urgently as this may need surgical tx.  Pt expressed understanding and is in agreement w/ plan.

## 2013-11-15 ENCOUNTER — Encounter (HOSPITAL_COMMUNITY): Payer: Self-pay | Admitting: Otolaryngology

## 2013-11-15 LAB — CULTURE, ROUTINE-ABSCESS: Culture: NO GROWTH

## 2013-11-17 LAB — ANAEROBIC CULTURE

## 2013-12-15 ENCOUNTER — Ambulatory Visit: Payer: BC Managed Care – PPO | Admitting: Family Medicine

## 2013-12-29 ENCOUNTER — Ambulatory Visit: Payer: BC Managed Care – PPO | Admitting: Family Medicine

## 2014-01-16 ENCOUNTER — Other Ambulatory Visit: Payer: Self-pay | Admitting: Family Medicine

## 2014-01-17 NOTE — Telephone Encounter (Signed)
Med filled.  

## 2014-01-28 ENCOUNTER — Other Ambulatory Visit: Payer: Self-pay | Admitting: Family Medicine

## 2014-01-28 NOTE — Telephone Encounter (Signed)
Med filled.  

## 2014-02-04 ENCOUNTER — Ambulatory Visit (INDEPENDENT_AMBULATORY_CARE_PROVIDER_SITE_OTHER): Payer: BC Managed Care – PPO | Admitting: Medical

## 2014-02-04 ENCOUNTER — Encounter: Payer: Self-pay | Admitting: Medical

## 2014-02-04 VITALS — BP 137/85 | HR 103 | Temp 98.0°F | Ht 70.5 in | Wt 318.0 lb

## 2014-02-04 DIAGNOSIS — J3089 Other allergic rhinitis: Secondary | ICD-10-CM

## 2014-02-04 MED ORDER — FLUTICASONE PROPIONATE 50 MCG/ACT NA SUSP: 2.0000 | Freq: Every day | NASAL | Status: DC

## 2014-02-04 MED ORDER — AZITHROMYCIN 250 MG PO TABS: ORAL_TABLET | ORAL | Status: DC

## 2014-02-04 MED ORDER — BENZONATATE 100 MG PO CAPS: 100.0000 mg | ORAL_CAPSULE | Freq: Three times a day (TID) | ORAL | Status: DC | PRN

## 2014-02-04 NOTE — Progress Notes (Signed)
Subjective:    Patient ID: Victor Estrada, male    DOB: 30-Sep-1970, 43 y.o.   MRN: [id number]  HPI   Pt in stating his wife has been recently ill with bronchitis. Pt wife has been lingering with persisting symptoms. Pt concerned since he may get same illness and he is going out of town. He will be in [facility].  Pt for 2 days of mild st and moderate nasal congestion. In past some low grad nasal congestion that was thought to be allergic. Pt tired zyrtec(PCP recommended) for his possible chronic  nasal congestion but did not help in past.  Past Medical History  Diagnosis Date  . Diabetes mellitus   . Hyperlipidemia   . GERD (gastroesophageal reflux disease)   . Depression   . Complication of anesthesia   . PONV (postoperative nausea and vomiting)     post wisdom teeth  . Hypertension   . Head injury, acute, without loss of consciousness   . Shortness of breath     with exertion  . Anxiety   . Kidney stone   . Constipation   . Sinus disorder     History   Social History  . Marital Status: Married    Spouse Name: N/A    Number of Children: N/A  . Years of Education: N/A   Occupational History  . Not on file.   Social History Main Topics  . Smoking status: Never Smoker   . Smokeless tobacco: Never Used  . Alcohol Use: No  . Drug Use: No  . Sexual Activity: Not on file   Other Topics Concern  . 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 Dental surgery    . Wisdom tooth extraction    . Incision and drainage abscess Left 11/12/2013    Procedure: INCISION AND DRAINAGE LEFT CHEEK  ABSCESS;  Surgeon: Melida Quitter, MD;  Location: [facility] OR;  Service: ENT;  Laterality: Left;    Family History  Problem Relation Age of Onset  . Hyperlipidemia Father   . Heart disease Father   . Diabetes Father   . Heart disease Maternal Grandmother   . Diabetes  Paternal Grandmother   . Cancer Mother     No Known Allergies  Current Outpatient Prescriptions on File Prior to Visit  Medication Sig Dispense Refill  . acetaminophen (TYLENOL) 500 MG tablet Take 1,000 mg by mouth every 6 (six) hours as needed (for pain).     Marland Kitchen aspirin EC 81 MG tablet Take 162 mg by mouth daily.    . DULoxetine (CYMBALTA) 60 MG capsule Take 60 mg by mouth daily.    . Fenofibrate 150 MG CAPS Take 150 mg by mouth daily.    Marland Kitchen glimepiride (AMARYL) 4 MG tablet Take 4 mg by mouth 2 (two) times daily.    Marland Kitchen JANUMET 50-1000 MG per tablet TAKE 1 TABLET BY MOUTH TWICE DAILY WITH MEALS 60 tablet 3  . lisinopril (PRINIVIL,ZESTRIL) 20 MG tablet TAKE 1 TABLET BY MOUTH EVERY DAY 90 tablet 1  . Multiple Vitamin (MULTIVITAMIN) tablet Take 1 tablet by mouth daily.    . pantoprazole (PROTONIX) 40 MG tablet Take 40 mg by mouth daily.    . simvastatin (ZOCOR) 20 MG tablet TAKE 1 TABLET BY MOUTH EVERY EVENING 30 tablet 6  . sulfamethoxazole-trimethoprim (BACTRIM DS) 800-160  MG per tablet Take 1 tablet by mouth 2 (two) times daily. 20 tablet 0   No current facility-administered medications on file prior to visit.    BP 137/85 mmHg  Pulse 103  Temp(Src) 98 F (36.7 C) (Oral)  Ht 5' 10.5" (1.791 m)  Wt 318 lb (144.244 kg)  BMI 44.97 kg/m2  SpO2 95%     Review of Systems  Constitutional: Negative for fever, chills and fatigue.  HENT: Positive for congestion and sore throat. Negative for ear discharge, ear pain, facial swelling, hearing loss, nosebleeds, postnasal drip, rhinorrhea, sinus pressure and tinnitus.   Respiratory: Negative for cough, choking, chest tightness, shortness of breath and wheezing.   Cardiovascular: Negative for chest pain and palpitations.  Genitourinary: Negative.   Musculoskeletal: Negative.   Hematological: Negative for adenopathy. Does not bruise/bleed easily.       Objective:   Physical Exam   General  Mental Status - Alert. General Appearance -  Well groomed. Not in acute distress.  Skin Rashes- No Rashes.  HEENT Head- Normal. Ear Auditory Canal - Left- Normal. Right - Normal.Tympanic Membrane- Left- Normal. Right- Normal. Eye Sclera/Conjunctiva- Left- Normal. Right- Normal. Nose & Sinuses Nasal Mucosa- Left- Boggy + Congested. Right- Boggy + Congested. No sinus pressure Mouth & Throat Lips: Upper Lip- Normal: no dryness, cracking, pallor, cyanosis, or vesicular eruption. Lower Lip-Normal: no dryness, cracking, pallor, cyanosis or vesicular eruption. Buccal Mucosa- Bilateral- No Aphthous ulcers. Oropharynx- No Discharge or Erythema. pnd Tonsils: Characteristics- Bilateral- No Erythema or Congestion. Size/Enlargement- Bilateral- No enlargement. Discharge- bilateral-None.  Neck Neck- Supple. No Masses.   Chest and Lung Exam Auscultation: Breath Sounds:-Normal.  Cardiovascular Auscultation:Rythm- Regular.  Murmurs & Other Heart Sounds:Ausculatation of the heart reveal- No Murmurs.  Lymphatic Head & Neck General Head & Neck Lymphatics: Bilateral: Description- No Localized lymphadenopathy.         Assessment & Plan:

## 2014-02-04 NOTE — Assessment & Plan Note (Signed)
Possible chronic allergies. I advised him to try allegra and stop zyrtec. Also will add flonase.  If he worsens over next week then can start  antibiotic and benzonatate

## 2014-02-04 NOTE — Patient Instructions (Signed)
By your description, you may have chronic mild allergic rhinitis. I am prescribing fluticasone nasal spray and recommend allegra otc.  You presently do not have bronchitis type symptoms but I understand your concerns as you head out of town for one week. If your symptoms worsen while out of town, I am going to go ahead and prescribe azithromycin antibiotic and benzonatate for cough. If your have severe symptoms while out of town be seen by a provider there.  Follow up in 10 days or as needed.

## 2014-02-04 NOTE — Progress Notes (Signed)
Pre visit review using our clinic review tool, if applicable. No additional management support is needed unless otherwise documented below in the visit note. 

## 2014-04-20 ENCOUNTER — Ambulatory Visit (INDEPENDENT_AMBULATORY_CARE_PROVIDER_SITE_OTHER): Payer: BLUE CROSS/BLUE SHIELD | Admitting: Family Medicine

## 2014-04-20 ENCOUNTER — Encounter: Payer: Self-pay | Admitting: Family Medicine

## 2014-04-20 VITALS — BP 126/76 | HR 93 | Temp 97.9°F | Resp 16 | Wt 316.4 lb

## 2014-04-20 DIAGNOSIS — J069 Acute upper respiratory infection, unspecified: Secondary | ICD-10-CM | POA: Insufficient documentation

## 2014-04-20 MED ORDER — PROMETHAZINE-DM 6.25-15 MG/5ML PO SYRP: 5.0000 mL | ORAL_SOLUTION | Freq: Four times a day (QID) | ORAL | Status: DC | PRN

## 2014-04-20 NOTE — Assessment & Plan Note (Signed)
Pt's sxs and PE consistent w/ viral illness.  No evidence of bacterial infxn, no need for abx.  Restart Flonase, daily antihistamine.  Cough meds prn.  Reviewed supportive care and red flags that should prompt return.  Pt expressed understanding and is in agreement w/ plan.

## 2014-04-20 NOTE — Progress Notes (Signed)
   Subjective:    Patient ID: Victor Estrada, male    DOB: [birth year], 44 y.o.   MRN: [id number]  HPI URI- 'i think i'm just getting a cold'.  sxs started Monday evening w/ sore throat.  + nasal congestion.  No chest congestion.  + PND w/ cough.  No fevers.  + HAs, mild sinus pressure.  Bilateral ear fullness.  + tooth pain.  No N/V.  + sick contacts.  No currently using Flonase.   Review of Systems For ROS see HPI   Reviewed PMH     Objective:   Physical Exam  Constitutional: He appears well-developed and well-nourished. No distress.  HENT:  Head: Normocephalic and atraumatic.  No TTP over sinuses + turbinate edema + PND TMs normal bilaterally  Eyes: Conjunctivae and EOM are normal. Pupils are equal, round, and reactive to light.  Neck: Normal range of motion. Neck supple.  Cardiovascular: Normal rate, regular rhythm and normal heart sounds.   Pulmonary/Chest: Effort normal and breath sounds normal. No respiratory distress. He has no wheezes.  Lymphadenopathy:    He has no cervical adenopathy.  Skin: Skin is warm and dry.  Vitals reviewed.         Assessment & Plan:   Problem List Items Addressed This Visit    URI (upper respiratory infection) - Primary    Pt's sxs and PE consistent w/ viral illness.  No evidence of bacterial infxn, no need for abx.  Restart Flonase, daily antihistamine.  Cough meds prn.  Reviewed supportive care and red flags that should prompt return.  Pt expressed understanding and is in agreement w/ plan.

## 2014-04-20 NOTE — Progress Notes (Signed)
Pre visit review using our clinic review tool, if applicable. No additional management support is needed unless otherwise documented below in the visit note. 

## 2014-04-20 NOTE — Patient Instructions (Signed)
Follow up as needed Restart the Flonase- 2 sprays each nostril daily Drink plenty of fluids Claritin/Zyrtec/Allegra daily Mucinex DM in combo w/ tessalon cough pills for daytime cough Cough syrup for nights/weekends REST! Call with any questions or concerns Hang in there!!

## 2014-04-24 ENCOUNTER — Other Ambulatory Visit: Payer: Self-pay | Admitting: Family Medicine

## 2014-04-25 NOTE — Telephone Encounter (Signed)
Med filled.  

## 2014-04-26 ENCOUNTER — Telehealth: Payer: Self-pay | Admitting: *Deleted

## 2014-04-26 NOTE — Telephone Encounter (Signed)
Prior authorization initiated for Janumet. Awaiting determination. JG//[id number]

## 2014-04-27 NOTE — Telephone Encounter (Signed)
PA approved effective 04/26/2014 through 03/24/2038.

## 2014-06-02 ENCOUNTER — Other Ambulatory Visit: Payer: Self-pay | Admitting: General Practice

## 2014-06-02 MED ORDER — LISINOPRIL 20 MG PO TABS: 20.0000 mg | ORAL_TABLET | Freq: Every day | ORAL | Status: DC

## 2014-06-02 MED ORDER — PANTOPRAZOLE SODIUM 40 MG PO TBEC: 40.0000 mg | DELAYED_RELEASE_TABLET | Freq: Every day | ORAL | Status: DC

## 2014-06-02 MED ORDER — SITAGLIPTIN PHOS-METFORMIN HCL 50-1000 MG PO TABS: 1.0000 | ORAL_TABLET | Freq: Two times a day (BID) | ORAL | Status: DC

## 2014-06-02 MED ORDER — GLIMEPIRIDE 4 MG PO TABS: 4.0000 mg | ORAL_TABLET | Freq: Two times a day (BID) | ORAL | Status: DC

## 2014-07-28 ENCOUNTER — Ambulatory Visit (INDEPENDENT_AMBULATORY_CARE_PROVIDER_SITE_OTHER): Payer: 59 | Admitting: Family Medicine

## 2014-07-28 ENCOUNTER — Encounter: Payer: Self-pay | Admitting: Family Medicine

## 2014-07-28 VITALS — BP 140/82 | HR 108 | Temp 98.2°F | Resp 16 | Wt 319.0 lb

## 2014-07-28 DIAGNOSIS — S39011A Strain of muscle, fascia and tendon of abdomen, initial encounter: Secondary | ICD-10-CM | POA: Diagnosis not present

## 2014-07-28 DIAGNOSIS — J069 Acute upper respiratory infection, unspecified: Secondary | ICD-10-CM | POA: Diagnosis not present

## 2014-07-28 NOTE — Progress Notes (Signed)
Pre visit review using our clinic review tool, if applicable. No additional management support is needed unless otherwise documented below in the visit note. 

## 2014-07-28 NOTE — Assessment & Plan Note (Signed)
Pt's sxs are consistent w/ viral illness.  No evidence of bacterial infxn on PE and therefor, no abx needed.  Reviewed supportive care and red flags that should prompt return.  Pt expressed understanding and is in agreement w/ plan.

## 2014-07-28 NOTE — Assessment & Plan Note (Signed)
New.  Pt's sharp but fleeting abdominal wall pain is consistent w/ strain or spasm.  Has been asymptomatic since 2nd episode more than 1 week ago.  No further workup at this time.  Discussed importance of core strengthening.

## 2014-07-28 NOTE — Patient Instructions (Signed)
Follow up as needed The abdominal strains sound muscular in nature and were likely spasms due to quick movements The upper respiratory symptoms are consistent w/ a viral illness and should continue to improve Drink plenty of fluids, rest, continue Mucinex D (or similar decongestant), and your daily allergy meds Call with any questions or concerns Hang in there!!!

## 2014-07-28 NOTE — Progress Notes (Signed)
   Subjective:    Patient ID: Victor Estrada, male    DOB: November 24, 1970, 44 y.o.   MRN: [id number]  HPI abd strain- 1st occurred 2-3 weeks ago when lying on stomach and when he sat up, he had severe pain straight down from umbilicus for '10 seconds'.  1 week ago, did a quick rotating motion and had severe LLQ pain for 20-30 seconds.  Both resolved spontaneously.  Has moved similarly since but has not had recurrence of pain.  URI- sxs started Monday, 'i felt a little weird, but it really set in yesterday'.  Now w/ colored nasal drainage.  Mild sinus pain/pressure.  No fevers.  Mild cough due to PND.  No ear pain.  No N/V.  No known sick contacts.  Taking Mucinex multisymptom.   Review of Systems For ROS see HPI     Objective:   Physical Exam  Constitutional: He appears well-developed and well-nourished. No distress.  HENT:  Head: Normocephalic and atraumatic.  No TTP over sinuses + turbinate edema + PND TMs normal bilaterally  Eyes: Conjunctivae and EOM are normal. Pupils are equal, round, and reactive to light.  Neck: Normal range of motion. Neck supple.  Cardiovascular: Normal rate, regular rhythm and normal heart sounds.   Pulmonary/Chest: Effort normal and breath sounds normal. No respiratory distress. He has no wheezes.  Abdominal: Soft. Bowel sounds are normal. He exhibits no distension. There is no tenderness. There is no rebound and no guarding.  obese  Lymphadenopathy:    He has no cervical adenopathy.  Skin: Skin is warm and dry.  Psychiatric: He has a normal mood and affect. His behavior is normal.  Vitals reviewed.         Assessment & Plan:

## 2014-08-31 ENCOUNTER — Ambulatory Visit (INDEPENDENT_AMBULATORY_CARE_PROVIDER_SITE_OTHER): Payer: 59 | Admitting: Medical

## 2014-08-31 ENCOUNTER — Encounter: Payer: Self-pay | Admitting: Medical

## 2014-08-31 VITALS — BP 134/77 | HR 115 | Temp 98.7°F | Ht 70.0 in | Wt 318.6 lb

## 2014-08-31 DIAGNOSIS — J029 Acute pharyngitis, unspecified: Secondary | ICD-10-CM | POA: Diagnosis not present

## 2014-08-31 LAB — POCT RAPID STREP A (OFFICE): Rapid Strep A Screen: NEGATIVE

## 2014-08-31 MED ORDER — AZITHROMYCIN 250 MG PO TABS: ORAL_TABLET | ORAL | Status: DC

## 2014-08-31 NOTE — Patient Instructions (Addendum)
Your strep test was negative. However, your physical exam and clinical presentation is suspicious for strep and it is important to note that rapid strep test can be falsely negative. So I am going to give you azithromycin antibiotic today based on your exam and clinical presentation.  Rest hydrate, tylenol for fever, and warm salt water gargles.   Follow up in 7 days or as needed.   Continue you nasal spray for your congestion

## 2014-08-31 NOTE — Progress Notes (Signed)
Subjective:    Patient ID: Victor Estrada, male    DOB: 05-Jun-1970, 44 y.o.   MRN: [id number]  HPI  2-3 days of moderate st. Getting worse. At first uvula mild swollen and now tonsils swollen. Faint subjective fever. Mild sweaty. Also mild nasal congestion. Some body aches last 2 days.  Hurt to swallow saliva.  Review of Systems  Constitutional: Positive for fever and chills.  HENT: Positive for postnasal drip and sore throat. Negative for congestion, ear discharge, ear pain and nosebleeds.   Respiratory: Negative for cough, chest tightness and wheezing.   Cardiovascular: Negative for chest pain and palpitations.  Gastrointestinal: Negative for abdominal pain.  Musculoskeletal: Negative for back pain.  Neurological: Negative for dizziness and headaches.  Hematological: Negative for adenopathy. Does not bruise/bleed easily.   Past Medical History  Diagnosis Date  . Diabetes mellitus   . Hyperlipidemia   . GERD (gastroesophageal reflux disease)   . Depression   . Complication of anesthesia   . PONV (postoperative nausea and vomiting)     post wisdom teeth  . Hypertension   . Head injury, acute, without loss of consciousness   . Shortness of breath     with exertion  . Anxiety   . Kidney stone   . Constipation   . Sinus disorder     History   Social History  . Marital Status: Married    Spouse Name: N/A  . Number of Children: N/A  . Years of Education: N/A   Occupational History  . Not on file.   Social History Main Topics  . Smoking status: Never Smoker   . Smokeless tobacco: Never Used  . Alcohol Use: No  . Drug Use: No  . Sexual Activity: Not on file   Other Topics Concern  . 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 Dental surgery    . Wisdom tooth extraction    . Incision and drainage abscess Left 11/12/2013    Procedure: INCISION  AND DRAINAGE LEFT CHEEK  ABSCESS;  Surgeon: Melida Quitter, MD;  Location: [facility] OR;  Service: ENT;  Laterality: Left;    Family History  Problem Relation Age of Onset  . Hyperlipidemia Father   . Heart disease Father   . Diabetes Father   . Heart disease Maternal Grandmother   . Diabetes Paternal Grandmother   . Cancer Mother     No Known Allergies  Current Outpatient Prescriptions on File Prior to Visit  Medication Sig Dispense Refill  . acetaminophen (TYLENOL) 500 MG tablet Take 1,000 mg by mouth every 6 (six) hours as needed (for pain).     Marland Kitchen aspirin EC 81 MG tablet Take 162 mg by mouth daily.    Marland Kitchen buPROPion (WELLBUTRIN XL) 300 MG 24 hr tablet Take 300 mg by mouth daily.    . DULoxetine (CYMBALTA) 60 MG capsule Take 60 mg by mouth daily.    . Fenofibrate 150 MG CAPS Take 150 mg by mouth daily.    . fluticasone (FLONASE) 50 MCG/ACT nasal spray Place 2 sprays into both nostrils daily. 16 g 3  . glimepiride (AMARYL) 4 MG tablet Take 1 tablet (4 mg total) by mouth 2 (two) times daily. 60 tablet 6  . lisinopril (PRINIVIL,ZESTRIL) 20 MG tablet Take 1 tablet (20 mg total) by mouth daily. 90 tablet 0  .  Multiple Vitamin (MULTIVITAMIN) tablet Take 1 tablet by mouth daily.    . pantoprazole (PROTONIX) 40 MG tablet Take 1 tablet (40 mg total) by mouth daily. 30 tablet 6  . simvastatin (ZOCOR) 20 MG tablet TAKE 1 TABLET BY MOUTH EVERY EVENING 30 tablet 6  . sitaGLIPtin-metformin (JANUMET) 50-1000 MG per tablet Take 1 tablet by mouth 2 (two) times daily with a meal. 60 tablet 3   No current facility-administered medications on file prior to visit.    BP 134/77 mmHg  Pulse 119  Temp(Src) 98.7 F (37.1 C) (Oral)  Ht 5\' 10"  (1.778 m)  Wt 318 lb 9.6 oz (144.516 kg)  BMI 45.71 kg/m2  SpO2 100%       Objective:   Physical Exam  General  Mental Status - Alert. General Appearance - Well groomed. Not in acute distress.  Skin Rashes- No Rashes.  HEENT Head- Normal. Ear Auditory  Canal - Left- Normal. Right - Normal.Tympanic Membrane- Left- Normal. Right- Normal. Eye Sclera/Conjunctiva- Left- Normal. Right- Normal. Nose & Sinuses Nasal Mucosa- Left-  No boggy and Congested. Right-  No boggy and  Congested.Bilateral maxillary and frontal sinus pressure. Mouth & Throat Lips: Upper Lip- Normal: no dryness, cracking, pallor, cyanosis, or vesicular eruption. Lower Lip-Normal: no dryness, cracking, pallor, cyanosis or vesicular eruption. Buccal Mucosa- Bilateral- No Aphthous ulcers. Oropharynx- No Discharge or Erythema. Tonsils: Characteristics- Bilateral- moderate Erythema + Congestion. Size/Enlargement- Bilateral- 2= enlargement. Discharge- bilateral-None.  Neck Neck- Supple. No Masses. Mild shoddy tender submandibular nodes swollen.   Chest and Lung Exam Auscultation: Breath Sounds:-Clear even and unlabored.  Cardiovascular Auscultation:Rythm- Regular, rate and rhythm. Murmurs & Other Heart Sounds:Ausculatation of the heart reveal- No Murmurs.  Lymphatic Head & Neck General Head & Neck Lymphatics: Bilateral: Description- see neck.        Assessment & Plan:  Your strep test was negative. However, your physical exam and clinical presentation is suspicious for strep and it is important to note that rapid strep test can be falsely negative. So I am going to give you azithromycin antibiotic today based on your exam and clinical presentation.  Rest hydrate, tylenol for fever, and warm salt water gargles.   Follow up in 7 days or as needed.   Continue you nasal spray for your congestion

## 2014-08-31 NOTE — Progress Notes (Signed)
Pre visit review using our clinic review tool, if applicable. No additional management support is needed unless otherwise documented below in the visit note. 

## 2014-09-23 ENCOUNTER — Telehealth: Payer: Self-pay | Admitting: Nurse Practitioner

## 2014-09-23 DIAGNOSIS — R05 Cough: Secondary | ICD-10-CM

## 2014-09-23 DIAGNOSIS — R059 Cough, unspecified: Secondary | ICD-10-CM

## 2014-09-23 NOTE — Progress Notes (Signed)
We are sorry that you are not feeling well.  Here is how we plan to help!  Based on what you have shared with me it looks like you have upper respiratory tract inflammation that has resulted in a significant cough.  Inflammation and infection in the upper respiratory tract is commonly called bronchitis and has four common causes:  Allergies, Viral Infections, Acid Reflux and Bacterial Infections.  Allergies, viruses and acid reflux are treated by controlling symptoms or eliminating the cause. An example might be a cough caused by taking certain blood pressure medications. You stop the cough by changing the medication. Another example might be a cough caused by acid reflux. Controlling the reflux helps control the cough.  Based on your presentation I believe you most likely have A cough due to a virus.  This is called viral bronchitis and is best treated by rest, plenty of fluids and control of the cough.  You may use Ibuprofen or Tylenol as directed to help your symptoms.    In addition you may use A non-prescription cough medication called Mucinex DM: take 2 tablets every 12 hours.    HOME CARE . Only take medications as instructed by your medical team. . Complete the entire course of an antibiotic. . Drink plenty of fluids and get plenty of rest. . Avoid close contacts especially the very young and the elderly . Cover your mouth if you cough or cough into your sleeve. . Always remember to wash your hands . A steam or ultrasonic humidifier can help congestion.    GET HELP RIGHT AWAY IF: . You develop worsening fever. . You become short of breath . You cough up blood. . Your symptoms persist after you have completed your treatment plan MAKE SURE YOU   Understand these instructions.  Will watch your condition.  Will get help right away if you are not doing well or get worse.  Your e-visit answers were reviewed by a board certified advanced clinical practitioner to complete your personal  care plan.  Depending on the condition, your plan could have included both over the counter or prescription medications.  If there is a problem please reply  once you have received a response from your provider.  Your safety is important to [place].  If you have drug allergies check your prescription carefully.    You can use MyChart to ask questions about today's visit, request a non-urgent call back, or ask for a work or school excuse.  You will get an e-mail in the next two days asking about your experience.  I hope that your e-visit has been valuable and will speed your recovery. Thank you for using e-visits.

## 2014-10-11 ENCOUNTER — Other Ambulatory Visit: Payer: Self-pay | Admitting: Family Medicine

## 2014-10-11 NOTE — Telephone Encounter (Signed)
Med filled.  

## 2014-10-28 ENCOUNTER — Other Ambulatory Visit: Payer: Self-pay | Admitting: Family Medicine

## 2014-10-28 NOTE — Telephone Encounter (Signed)
Medication filled to pharmacy as requested.   

## 2014-11-03 ENCOUNTER — Other Ambulatory Visit: Payer: Self-pay | Admitting: General Practice

## 2014-11-03 MED ORDER — LISINOPRIL 20 MG PO TABS: 20.0000 mg | ORAL_TABLET | Freq: Every day | ORAL | Status: DC

## 2014-12-09 ENCOUNTER — Other Ambulatory Visit: Payer: Self-pay | Admitting: Family Medicine

## 2014-12-09 MED ORDER — SITAGLIPTIN PHOS-METFORMIN HCL 50-1000 MG PO TABS: 1.0000 | ORAL_TABLET | Freq: Two times a day (BID) | ORAL | Status: DC

## 2014-12-26 ENCOUNTER — Other Ambulatory Visit: Payer: Self-pay | Admitting: Family Medicine

## 2014-12-27 MED ORDER — SITAGLIPTIN PHOS-METFORMIN HCL 50-1000 MG PO TABS: 1.0000 | ORAL_TABLET | Freq: Two times a day (BID) | ORAL | Status: DC

## 2014-12-27 MED ORDER — PANTOPRAZOLE SODIUM 40 MG PO TBEC: 40.0000 mg | DELAYED_RELEASE_TABLET | Freq: Every day | ORAL | Status: DC

## 2014-12-27 MED ORDER — GLIMEPIRIDE 4 MG PO TABS: 4.0000 mg | ORAL_TABLET | Freq: Two times a day (BID) | ORAL | Status: DC

## 2014-12-27 NOTE — Telephone Encounter (Signed)
Medication filled to pharmacy as requested.   

## 2014-12-27 NOTE — Addendum Note (Signed)
Addended by: Davis Gourd on: 12/27/2014 10:23 AM   Modules accepted: Orders

## 2015-01-04 ENCOUNTER — Ambulatory Visit: Payer: 59 | Admitting: Family Medicine

## 2015-01-18 ENCOUNTER — Ambulatory Visit (INDEPENDENT_AMBULATORY_CARE_PROVIDER_SITE_OTHER): Payer: 59 | Admitting: Internal Medicine

## 2015-01-18 ENCOUNTER — Encounter: Payer: Self-pay | Admitting: Internal Medicine

## 2015-01-18 VITALS — BP 124/86 | HR 112 | Temp 97.9°F | Ht 70.0 in | Wt 314.1 lb

## 2015-01-18 DIAGNOSIS — R000 Tachycardia, unspecified: Secondary | ICD-10-CM | POA: Diagnosis not present

## 2015-01-18 DIAGNOSIS — M7989 Other specified soft tissue disorders: Secondary | ICD-10-CM | POA: Diagnosis not present

## 2015-01-18 DIAGNOSIS — L03116 Cellulitis of left lower limb: Secondary | ICD-10-CM

## 2015-01-18 MED ORDER — CEPHALEXIN 500 MG PO CAPS: 500.0000 mg | ORAL_CAPSULE | Freq: Four times a day (QID) | ORAL | Status: DC

## 2015-01-18 NOTE — Patient Instructions (Signed)
Get your blood work before you leave   We are arranging a ultrasound of your leg today  Take the antibiotic as prescribed, call if not improving in the next 2-3 days, call anytime if fever, chills or if the redness spreads.  Leg elevation     Next visit  to see your primary doctor in 2 weeks. Sooner if needed.

## 2015-01-18 NOTE — Progress Notes (Signed)
Pre visit review using our clinic review tool, if applicable. No additional management support is needed unless otherwise documented below in the visit note. 

## 2015-01-18 NOTE — Progress Notes (Signed)
Subjective:    Patient ID: Victor Estrada, male    DOB: 07-Dec-1970, 44 y.o.   MRN: [id number]  DOS:  01/18/2015 Type of visit - description : Acute visit Interval history: 3 days ago, developed itching on the left pretibial area, the next day the area was red, puffy, the skin felt tight. Had a small "bump" in there and now some excoriations from scratching. Few years ago had cellulitis on the other leg and symptoms are very similar. Incidentally, his noted to be tachycardic, on chart review, his pulse has been elevated in the last few months.  Review of Systems Had fever with the onset of symptoms but no further temperature. No chills Denies chest pain, difficulty breathing or palpitations. Admits to [place]  X a while, he thinks because he is overweight and does not exercise regularly. No nausea vomiting or diarrhea No recent airplane to or prolonged car trip.  Past Medical History  Diagnosis Date  . Diabetes mellitus   . Hyperlipidemia   . GERD (gastroesophageal reflux disease)   . Depression   . Complication of anesthesia   . PONV (postoperative nausea and vomiting)     post wisdom teeth  . Hypertension   . Head injury, acute, without loss of consciousness   . Shortness of breath     with exertion  . Anxiety   . Kidney stone   . Constipation   . Sinus disorder     Past Surgical History  Procedure Laterality Date  . Dental surgery    . Wisdom tooth extraction    . Incision and drainage abscess Left 11/12/2013    Procedure: INCISION AND DRAINAGE LEFT CHEEK  ABSCESS;  Surgeon: Melida Quitter, MD;  Location: [facility];  Service: ENT;  Laterality: Left;    Social History   Social History  . Marital Status: Married    Spouse Name: N/A  . Number of Children: N/A  . Years of Education: N/A   Occupational History  . Not on file.   Social History Main Topics  . Smoking status: Never Smoker   . Smokeless tobacco: Never Used  . Alcohol Use: No  . Drug Use: No  . Sexual  Activity: Not on file   Other Topics Concern  . Not on file   Social History Narrative   Married 1 son   Dance movement psychotherapist   2-3 caffeine drinks/day        Medication List       This list is accurate as of: 01/18/15 11:59 PM.  Always use your most recent med list.               acetaminophen 500 MG tablet  Commonly known as:  TYLENOL  Take 1,000 mg by mouth every 6 (six) hours as needed (for pain).     aspirin EC 81 MG tablet  Take 162 mg by mouth daily.     buPROPion 300 MG 24 hr tablet  Commonly known as:  WELLBUTRIN XL  Take 300 mg by mouth daily.     cephALEXin 500 MG capsule  Commonly known as:  KEFLEX  Take 1 capsule (500 mg total) by mouth 4 (four) times daily.     Fenofibrate 150 MG Caps  Take 150 mg by mouth daily.     fluticasone 50 MCG/ACT nasal spray  Commonly known as:  FLONASE  Place 2 sprays into both nostrils daily.     glimepiride 4 MG tablet  Commonly known as:  AMARYL  Take 1 tablet (4 mg total) by mouth 2 (two) times daily.     lisinopril 20 MG tablet  Commonly known as:  PRINIVIL,ZESTRIL  Take 1 tablet (20 mg total) by mouth daily.     multivitamin tablet  Take 1 tablet by mouth daily.     pantoprazole 40 MG tablet  Commonly known as:  PROTONIX  Take 1 tablet (40 mg total) by mouth daily.     simvastatin 20 MG tablet  Commonly known as:  ZOCOR  TAKE 1 TABLET BY MOUTH DAILY EVERY EVENING     sitaGLIPtin-metformin 50-1000 MG tablet  Commonly known as:  JANUMET  Take 1 tablet by mouth 2 (two) times daily with a meal.     TRINTELLIX 10 MG Tabs  Generic drug:  Vortioxetine HBr  Take 1 tablet by mouth daily.           Objective:   Physical Exam  Musculoskeletal:       Legs:  BP 124/86 mmHg  Pulse 112  Temp(Src) 97.9 F (36.6 C) (Oral)  Ht 5\' 10"  (1.778 m)  Wt 314 lb 2 oz (142.486 kg)  BMI 45.07 kg/m2  SpO2 96% General:   Well developed, well nourished . NAD.  Neck: No thyromegaly HEENT:  Normocephalic . Face  symmetric, atraumatic Lungs:  CTA B Normal respiratory effort, no intercostal retractions, no accessory muscle use. Heart: Tachycardic, no murmur Legs: See graphic. Left calf is about half inch larger compared to the right, no TTP Skin: Not pale. Not jaundice Neurologic:  alert & oriented X3.  Speech normal, gait appropriate for age and unassisted Psych--  Cognition and judgment appear intact.  Cooperative with normal attention span and concentration.  Behavior appropriate. No anxious or depressed appearing.      Assessment & Plan:   Cellulitis, left leg: We'll get an ultrasound to rule out DVT, start Keflex, leg elevation, call anytime if redness spreads. Addendum: Ultrasound was negative for DVT. Patient was notified.report scanned .   Tachycardia: Noted for the last few months. No hypoxia. Has leg swelling and ultrasound is pending to rule out DVT ---> doubt a PE because tachycardia   started a few months ago. EKG: Sinus tachycardia Will check a CBC, TSH, BMP  Return to clinic in 2 weeks to see PCP

## 2015-01-19 LAB — BASIC METABOLIC PANEL
BUN: 17 mg/dL (ref 6–23)
CO2: 24 mEq/L (ref 19–32)
Calcium: 9.1 mg/dL (ref 8.4–10.5)
Chloride: 98 mEq/L (ref 96–112)
Creatinine, Ser: 1.05 mg/dL (ref 0.40–1.50)
GFR: 81.69 mL/min (ref >60.00)
Glucose, Bld: 367 mg/dL — ABNORMAL HIGH (ref 70–99)
Potassium: 4.8 mEq/L (ref 3.5–5.1)
Sodium: 134 mEq/L — ABNORMAL LOW (ref 135–145)

## 2015-01-19 LAB — CBC WITH DIFFERENTIAL/PLATELET
Basophils Absolute: 0.1 10*3/uL (ref 0.0–0.1)
Basophils Relative: 1.1 % (ref 0.0–3.0)
Eosinophils Absolute: 0.2 10*3/uL (ref 0.0–0.7)
Eosinophils Relative: 1.2 % (ref 0.0–5.0)
HCT: 41.6 % (ref 39.0–52.0)
Hemoglobin: 13.8 g/dL (ref 13.0–17.0)
Lymphocytes Relative: 19.9 % (ref 12.0–46.0)
Lymphs Abs: 2.6 10*3/uL (ref 0.7–4.0)
MCHC: 33.3 g/dL (ref 30.0–36.0)
MCV: 84.7 fl (ref 78.0–100.0)
Monocytes Absolute: 1 10*3/uL (ref 0.1–1.0)
Monocytes Relative: 7.8 % (ref 3.0–12.0)
Neutro Abs: 9.3 10*3/uL — ABNORMAL HIGH (ref 1.4–7.7)
Neutrophils Relative %: 70 % (ref 43.0–77.0)
Platelets: 345 10*3/uL (ref 150.0–400.0)
RBC: 4.91 Mil/uL (ref 4.22–5.81)
RDW: 14.1 % (ref 11.5–15.5)
WBC: 13.3 10*3/uL — ABNORMAL HIGH (ref 4.0–10.5)

## 2015-01-19 LAB — TSH: TSH: 2 u[IU]/mL (ref 0.35–4.50)

## 2015-01-25 ENCOUNTER — Ambulatory Visit: Payer: Self-pay | Admitting: Family Medicine

## 2015-01-30 ENCOUNTER — Other Ambulatory Visit: Payer: Self-pay | Admitting: Family Medicine

## 2015-01-30 NOTE — Telephone Encounter (Signed)
Medication filled to pharmacy as requested.   

## 2015-02-01 ENCOUNTER — Ambulatory Visit: Payer: 59 | Admitting: Family Medicine

## 2015-02-06 ENCOUNTER — Encounter: Payer: Self-pay | Admitting: Family Medicine

## 2015-02-06 ENCOUNTER — Ambulatory Visit (INDEPENDENT_AMBULATORY_CARE_PROVIDER_SITE_OTHER): Payer: 59 | Admitting: Family Medicine

## 2015-02-06 VITALS — BP 130/84 | HR 94 | Temp 97.9°F | Resp 16 | Ht 70.0 in | Wt 316.1 lb

## 2015-02-06 DIAGNOSIS — IMO0001 Reserved for inherently not codable concepts without codable children: Secondary | ICD-10-CM

## 2015-02-06 DIAGNOSIS — I10 Essential (primary) hypertension: Secondary | ICD-10-CM | POA: Diagnosis not present

## 2015-02-06 DIAGNOSIS — E1165 Type 2 diabetes mellitus with hyperglycemia: Secondary | ICD-10-CM

## 2015-02-06 DIAGNOSIS — E785 Hyperlipidemia, unspecified: Secondary | ICD-10-CM | POA: Diagnosis not present

## 2015-02-06 NOTE — Progress Notes (Signed)
Pre visit review using our clinic review tool, if applicable. No additional management support is needed unless otherwise documented below in the visit note. 

## 2015-02-06 NOTE — Assessment & Plan Note (Signed)
Pt has not followed up as directed.  He is on ACE for renal protection.  Overdue for eye exam- pt encouraged to schedule.  He is morbidly obese and I stressed the need for regular follow up and low carb and regular exercise.  Check labs.  Adjust meds prn

## 2015-02-06 NOTE — Progress Notes (Signed)
   Subjective:    Patient ID: Victor Estrada, male    DOB: 1970-08-06, 44 y.o.   MRN: [id number]  HPI HTN- chronic problem, on Lisinopril daily.  Denies CP, SOB, HAs, visual changes, edema.  DM- chronic problem, on Janumet and Amaryl.  On ACE for renal protection.  Overdue on eye exam, foot exam.  Pt admits to very poor diet but has been regularly walking.  No symptomatic lows.  Denies numbness/tingling of hands/feet.  No sores on feet.    Hyperlipidemia- chronic problem, on fenofibrate and simvastatin daily.  Denies abd pain, N/V, myalgias.  Obesity- pt continues to gain weight.  Pt reports he gained quite a bit of weight since starting Trintellix- 'i've been out of control x3 weeks'.   Review of Systems For ROS see HPI     Objective:   Physical Exam  Constitutional: He is oriented to person, place, and time. He appears well-developed and well-nourished. No distress.  Morbidly obese  HENT:  Head: Normocephalic and atraumatic.  Eyes: Conjunctivae and EOM are normal. Pupils are equal, round, and reactive to light.  Neck: Normal range of motion. Neck supple. No thyromegaly present.  Cardiovascular: Normal rate, regular rhythm, normal heart sounds and intact distal pulses.   No murmur heard. Pulmonary/Chest: Effort normal and breath sounds normal. No respiratory distress.  Abdominal: Soft. Bowel sounds are normal. He exhibits no distension.  Musculoskeletal: He exhibits no edema.  Lymphadenopathy:    He has no cervical adenopathy.  Neurological: He is alert and oriented to person, place, and time. No cranial nerve deficit.  Skin: Skin is warm and dry.  Psychiatric: He has a normal mood and affect. His behavior is normal.  Vitals reviewed.         Assessment & Plan:

## 2015-02-06 NOTE — Assessment & Plan Note (Signed)
Chronic problem.  Adequate control on Lisinopril  Stressed need for healthy diet and regular exercise.  Check labs.  No anticipated med changes.

## 2015-02-06 NOTE — Assessment & Plan Note (Signed)
Chronic problem.  Tolerating statin and fenofibrate w/o difficulty.  Stressed need for healthy diet and regular exercise.  Check labs.  Adjust meds prn  

## 2015-02-06 NOTE — Assessment & Plan Note (Signed)
Chronic problem.  Pt is not eating well and only exercising sporadically.  Stressed need for both healthy diet and regular exercise.  Will continue to follow.

## 2015-02-06 NOTE — Patient Instructions (Signed)
Follow up in 3-4 months to recheck diabetes We'll notify you of your lab results and make any changes if needed Please try and make healthy food choices and get regular exercise Call Dr Delman Cheadle and schedule an eye exam- this is very important Call with any questions or concerns If you want to join [place] at the new [place] office, any scheduled appointments will automatically transfer and we will see you at [address] [place] [address] [place], [place] [address] (OPENING 03/28/15) Happy Holidays!!!

## 2015-02-07 ENCOUNTER — Encounter: Payer: Self-pay | Admitting: Family Medicine

## 2015-02-07 DIAGNOSIS — G479 Sleep disorder, unspecified: Secondary | ICD-10-CM

## 2015-02-07 LAB — LIPID PANEL
Cholesterol: 160 mg/dL (ref 0–200)
HDL: 36.6 mg/dL — ABNORMAL LOW (ref >39.00)
NonHDL: 123.78
Total CHOL/HDL Ratio: 4
Triglycerides: 281 mg/dL — ABNORMAL HIGH (ref 0.0–149.0)
VLDL: 56.2 mg/dL — ABNORMAL HIGH (ref 0.0–40.0)

## 2015-02-07 LAB — HEPATIC FUNCTION PANEL
ALT: 34 U/L (ref 0–53)
AST: 25 U/L (ref 0–37)
Albumin: 3.8 g/dL (ref 3.5–5.2)
Alkaline Phosphatase: 63 U/L (ref 39–117)
Bilirubin, Direct: 0.1 mg/dL (ref 0.0–0.3)
Total Bilirubin: 0.3 mg/dL (ref 0.2–1.2)
Total Protein: 6.8 g/dL (ref 6.0–8.3)

## 2015-02-07 LAB — HEMOGLOBIN A1C: Hgb A1c MFr Bld: 10.5 % — ABNORMAL HIGH (ref 4.6–6.5)

## 2015-02-07 LAB — LDL CHOLESTEROL, DIRECT: Direct LDL: 92 mg/dL

## 2015-02-08 ENCOUNTER — Other Ambulatory Visit: Payer: Self-pay | Admitting: Family Medicine

## 2015-02-08 DIAGNOSIS — E1165 Type 2 diabetes mellitus with hyperglycemia: Principal | ICD-10-CM

## 2015-02-08 DIAGNOSIS — IMO0001 Reserved for inherently not codable concepts without codable children: Secondary | ICD-10-CM

## 2015-02-08 NOTE — Telephone Encounter (Signed)
Referral placed.

## 2015-02-13 ENCOUNTER — Other Ambulatory Visit: Payer: Self-pay | Admitting: Family Medicine

## 2015-02-14 NOTE — Telephone Encounter (Signed)
Medication filled to pharmacy as requested.   

## 2015-03-04 ENCOUNTER — Telehealth: Payer: 59 | Admitting: Physician Assistant

## 2015-03-04 DIAGNOSIS — B9689 Other specified bacterial agents as the cause of diseases classified elsewhere: Secondary | ICD-10-CM

## 2015-03-04 DIAGNOSIS — J019 Acute sinusitis, unspecified: Secondary | ICD-10-CM | POA: Diagnosis not present

## 2015-03-04 MED ORDER — AMOXICILLIN-POT CLAVULANATE 875-125 MG PO TABS: 1.0000 | ORAL_TABLET | Freq: Two times a day (BID) | ORAL | Status: DC

## 2015-03-04 NOTE — Progress Notes (Signed)
We are sorry that you are not feeling well.  Here is how we plan to help!  Based on what you have shared with me it looks like you have sinusitis.  Sinusitis is inflammation and infection in the sinus cavities of the head.  Based on your presentation I believe you most likely have Acute Bacterial Sinusitis.  This is an infection caused by bacteria and is treated with antibiotics. I have prescribed Augmentin, an antibiotic in the penicillin family, one tablet twice daily with food, for 7 days. You may use an oral decongestant such as Mucinex D or if you have glaucoma or high blood pressure use plain Mucinex. Saline nasal spray help and can safely be used as often as needed for congestion.  If you develop worsening sinus pain, fever or notice severe headache and vision changes, or if symptoms are not better after completion of antibiotic, please schedule an appointment with a health care provider.    Sinus infections are not as easily transmitted as other respiratory infection, however we still recommend that you avoid close contact with loved ones, especially the very young and elderly.  Remember to wash your hands thoroughly throughout the day as this is the number one way to prevent the spread of infection!  Home Care:  Only take medications as instructed by your medical team.  Complete the entire course of an antibiotic.  Do not take these medications with alcohol.  A steam or ultrasonic humidifier can help congestion.  You can place a towel over your head and breathe in the steam from hot water coming from a faucet.  Avoid close contacts especially the very young and the elderly.  Cover your mouth when you cough or sneeze.  Always remember to wash your hands.  Get Help Right Away If:  You develop worsening fever or sinus pain.  You develop a severe head ache or visual changes.  Your symptoms persist after you have completed your treatment plan.  Make sure you  Understand these  instructions.  Will watch your condition.  Will get help right away if you are not doing well or get worse.  Your e-visit answers were reviewed by a board certified advanced clinical practitioner to complete your personal care plan.  Depending on the condition, your plan could have included both over the counter or prescription medications.  If there is a problem please reply  once you have received a response from your provider.  Your safety is important to [place].  If you have drug allergies check your prescription carefully.    You can use MyChart to ask questions about today's visit, request a non-urgent call back, or ask for a work or school excuse for 24 hours related to this e-Visit. If it has been greater than 24 hours you will need to follow up with your provider, or enter a new e-Visit to address those concerns.  You will get an e-mail in the next two days asking about your experience.  I hope that your e-visit has been valuable and will speed your recovery. Thank you for using e-visits.

## 2015-03-09 ENCOUNTER — Other Ambulatory Visit: Payer: Self-pay | Admitting: Family Medicine

## 2015-03-09 NOTE — Telephone Encounter (Signed)
Medication filled to pharmacy as requested.   

## 2015-04-07 MED FILL — GLIMEPIRIDE 4 MG TABLET: 4 | 30 days supply | Qty: 60 | Fill #2

## 2015-04-07 MED FILL — JANUMET 50-1,000 MG TABLET: 50-1000 | 30 days supply | Qty: 60 | Fill #3

## 2015-04-18 MED FILL — PANTOPRAZOLE SOD DR 40 MG T: 40 | 90 days supply | Qty: 90 | Fill #1

## 2015-04-18 MED FILL — BUPROPION HCL XL 300 MG TAB: 300 | 30 days supply | Qty: 30 | Fill #1

## 2015-04-21 ENCOUNTER — Encounter: Payer: Self-pay | Admitting: Family Medicine

## 2015-05-01 ENCOUNTER — Encounter: Payer: Self-pay | Admitting: Family Medicine

## 2015-05-03 MED FILL — TRINTELLIX 20 MG TABLET: 20 | 30 days supply | Qty: 30 | Fill #1

## 2015-05-12 MED FILL — GLIMEPIRIDE 4 MG TABLET: 4 | 90 days supply | Qty: 180 | Fill #1

## 2015-05-12 MED FILL — JANUMET 50-1,000 MG TABLET: 50-1000 | 30 days supply | Qty: 60 | Fill #4

## 2015-05-23 ENCOUNTER — Ambulatory Visit: Payer: 59 | Admitting: Family Medicine

## 2015-05-23 MED FILL — LISINOPRIL 20 MG TABLET: 20 | 90 days supply | Qty: 90 | Fill #1

## 2015-05-23 MED FILL — BUPROPION HCL XL 300 MG TAB: 300 | 30 days supply | Qty: 30 | Fill #0

## 2015-06-08 ENCOUNTER — Ambulatory Visit: Payer: 59 | Admitting: Family Medicine

## 2015-06-19 MED FILL — JANUMET 50-1,000 MG TABLET: 50-1000 | 30 days supply | Qty: 60 | Fill #5

## 2015-06-21 ENCOUNTER — Ambulatory Visit: Payer: Self-pay | Admitting: Family Medicine

## 2015-06-27 MED FILL — SIMVASTATIN 20 MG TABLET: 20 | 90 days supply | Qty: 90 | Fill #1

## 2015-06-27 MED FILL — BUPROPION HCL XL 300 MG TAB: 300 | 30 days supply | Qty: 30 | Fill #1

## 2015-06-28 DIAGNOSIS — F411 Generalized anxiety disorder: Secondary | ICD-10-CM | POA: Diagnosis not present

## 2015-06-28 DIAGNOSIS — F330 Major depressive disorder, recurrent, mild: Secondary | ICD-10-CM | POA: Diagnosis not present

## 2015-06-29 MED FILL — TRINTELLIX 10 MG TABLET: 10 | 30 days supply | Qty: 30 | Fill #0

## 2015-06-30 ENCOUNTER — Ambulatory Visit: Payer: 59 | Admitting: Family Medicine

## 2015-07-28 MED FILL — BUPROPION HCL XL 300 MG TAB: 300 | 30 days supply | Qty: 30 | Fill #0

## 2015-07-28 MED FILL — PANTOPRAZOLE SOD DR 40 MG T: 40 | 30 days supply | Qty: 30 | Fill #2

## 2015-07-28 MED FILL — TRINTELLIX 10 MG TABLET: 10 | 30 days supply | Qty: 30 | Fill #1

## 2015-07-31 ENCOUNTER — Telehealth: Payer: 59 | Admitting: Nurse Practitioner

## 2015-07-31 DIAGNOSIS — R05 Cough: Secondary | ICD-10-CM | POA: Diagnosis not present

## 2015-07-31 DIAGNOSIS — R059 Cough, unspecified: Secondary | ICD-10-CM

## 2015-07-31 MED ORDER — AZITHROMYCIN 250 MG PO TABS: ORAL_TABLET | ORAL | Status: DC

## 2015-07-31 MED FILL — JANUMET 50-1,000 MG TABLET: 50-1000 | 30 days supply | Qty: 60 | Fill #6

## 2015-07-31 MED FILL — AZITHROMYCIN 250 MG TABLET: 250 | 29 days supply | Qty: 6 | Fill #0

## 2015-07-31 NOTE — Progress Notes (Signed)

## 2015-08-10 MED FILL — GLIMEPIRIDE 4 MG TABLET: 4 | 30 days supply | Qty: 60 | Fill #2

## 2015-08-17 DIAGNOSIS — F330 Major depressive disorder, recurrent, mild: Secondary | ICD-10-CM | POA: Diagnosis not present

## 2015-08-17 DIAGNOSIS — F411 Generalized anxiety disorder: Secondary | ICD-10-CM | POA: Diagnosis not present

## 2015-08-18 ENCOUNTER — Other Ambulatory Visit: Payer: Self-pay | Admitting: Family Medicine

## 2015-08-18 MED FILL — LISINOPRIL 20 MG TABLET: 20 | 90 days supply | Qty: 90 | Fill #0

## 2015-08-18 NOTE — Telephone Encounter (Signed)
Medication filled to pharmacy as requested.   

## 2015-08-28 ENCOUNTER — Other Ambulatory Visit: Payer: Self-pay | Admitting: Family Medicine

## 2015-08-28 MED FILL — BUPROPION HCL XL 300 MG TAB: 300 | 30 days supply | Qty: 30 | Fill #1

## 2015-08-28 MED FILL — JANUMET 50-1,000 MG TABLET: 50-1000 | 30 days supply | Qty: 60 | Fill #0

## 2015-08-28 MED FILL — PANTOPRAZOLE SOD DR 40 MG T: 40 | 90 days supply | Qty: 90 | Fill #0

## 2015-08-28 MED FILL — TRINTELLIX 10 MG TABLET: 10 | 30 days supply | Qty: 30 | Fill #0

## 2015-08-28 NOTE — Telephone Encounter (Signed)
Medication filled to pharmacy as requested.   

## 2015-09-14 ENCOUNTER — Other Ambulatory Visit: Payer: Self-pay | Admitting: Family Medicine

## 2015-09-15 MED FILL — GLIMEPIRIDE 4 MG TABLET: 4 | 30 days supply | Qty: 60 | Fill #0

## 2015-09-15 NOTE — Telephone Encounter (Signed)
Medication filled to pharmacy as requested.   

## 2015-09-25 MED FILL — BUPROPION HCL XL 300 MG TAB: 300 | 30 days supply | Qty: 30 | Fill #0

## 2015-09-25 MED FILL — SIMVASTATIN 20 MG TABLET: 20 | 30 days supply | Qty: 30 | Fill #2

## 2015-09-25 MED FILL — TRINTELLIX 10 MG TABLET: 10 | 30 days supply | Qty: 30 | Fill #1

## 2015-09-29 ENCOUNTER — Other Ambulatory Visit: Payer: Self-pay | Admitting: Family Medicine

## 2015-09-29 ENCOUNTER — Encounter: Payer: Self-pay | Admitting: General Practice

## 2015-09-29 MED FILL — JANUMET 50-1,000 MG TABLET: 50-1000 | 30 days supply | Qty: 60 | Fill #0

## 2015-10-19 MED FILL — GLIMEPIRIDE 4 MG TABLET: 4 | 30 days supply | Qty: 60 | Fill #1

## 2015-10-19 MED FILL — TRINTELLIX 10 MG TABLET: 10 | 30 days supply | Qty: 30 | Fill #2

## 2015-10-19 MED FILL — BUPROPION HCL XL 300 MG TAB: 300 | 30 days supply | Qty: 30 | Fill #1

## 2015-11-03 ENCOUNTER — Encounter: Payer: Self-pay | Admitting: Family Medicine

## 2015-11-03 ENCOUNTER — Telehealth: Payer: Self-pay | Admitting: Family Medicine

## 2015-11-03 ENCOUNTER — Ambulatory Visit (INDEPENDENT_AMBULATORY_CARE_PROVIDER_SITE_OTHER): Payer: 59 | Admitting: Family Medicine

## 2015-11-03 VITALS — BP 128/82 | HR 76 | Temp 98.4°F | Resp 16 | Ht 70.0 in | Wt 321.0 lb

## 2015-11-03 DIAGNOSIS — IMO0001 Reserved for inherently not codable concepts without codable children: Secondary | ICD-10-CM

## 2015-11-03 DIAGNOSIS — J3089 Other allergic rhinitis: Secondary | ICD-10-CM

## 2015-11-03 DIAGNOSIS — E785 Hyperlipidemia, unspecified: Secondary | ICD-10-CM | POA: Diagnosis not present

## 2015-11-03 DIAGNOSIS — I10 Essential (primary) hypertension: Secondary | ICD-10-CM | POA: Diagnosis not present

## 2015-11-03 DIAGNOSIS — E1165 Type 2 diabetes mellitus with hyperglycemia: Secondary | ICD-10-CM | POA: Diagnosis not present

## 2015-11-03 DIAGNOSIS — G4733 Obstructive sleep apnea (adult) (pediatric): Secondary | ICD-10-CM

## 2015-11-03 LAB — HEPATIC FUNCTION PANEL
ALT: 38 U/L (ref 9–46)
AST: 30 U/L (ref 10–40)
Albumin: 4 g/dL (ref 3.6–5.1)
Alkaline Phosphatase: 77 U/L (ref 40–115)
Bilirubin, Direct: 0.1 mg/dL (ref <=0.2)
Indirect Bilirubin: 0.3 mg/dL (ref 0.2–1.2)
Total Bilirubin: 0.4 mg/dL (ref 0.2–1.2)
Total Protein: 6.6 g/dL (ref 6.1–8.1)

## 2015-11-03 LAB — CBC WITH DIFFERENTIAL/PLATELET
Basophils Absolute: 0 cells/uL (ref 0–200)
Basophils Relative: 0 %
Eosinophils Absolute: 98 cells/uL (ref 15–500)
Eosinophils Relative: 1 %
HCT: 42.9 % (ref 38.5–50.0)
Hemoglobin: 14.9 g/dL (ref 13.2–17.1)
Lymphocytes Relative: 26 %
Lymphs Abs: 2548 cells/uL (ref 850–3900)
MCH: 28.4 pg (ref 27.0–33.0)
MCHC: 34.7 g/dL (ref 32.0–36.0)
MCV: 81.9 fL (ref 80.0–100.0)
MPV: 9.4 fL (ref 7.5–12.5)
Monocytes Absolute: 686 cells/uL (ref 200–950)
Monocytes Relative: 7 %
Neutro Abs: 6468 cells/uL (ref 1500–7800)
Neutrophils Relative %: 66 %
Platelets: 344 10*3/uL (ref 140–400)
RBC: 5.24 MIL/uL (ref 4.20–5.80)
RDW: 14.2 % (ref 11.0–15.0)
WBC: 9.8 10*3/uL (ref 3.8–10.8)

## 2015-11-03 LAB — LIPID PANEL
Cholesterol: 151 mg/dL (ref 125–200)
HDL: 37 mg/dL — ABNORMAL LOW (ref >=40)
LDL Cholesterol: 48 mg/dL (ref <130)
Total CHOL/HDL Ratio: 4.1 ratio (ref <=5.0)
Triglycerides: 330 mg/dL — ABNORMAL HIGH (ref <150)
VLDL: 66 mg/dL — ABNORMAL HIGH (ref <30)

## 2015-11-03 LAB — BASIC METABOLIC PANEL
BUN: 15 mg/dL (ref 7–25)
CO2: 27 mmol/L (ref 20–31)
Calcium: 9.3 mg/dL (ref 8.6–10.3)
Chloride: 97 mmol/L — ABNORMAL LOW (ref 98–110)
Creat: 0.99 mg/dL (ref 0.60–1.35)
Glucose, Bld: 278 mg/dL — ABNORMAL HIGH (ref 65–99)
Potassium: 4.8 mmol/L (ref 3.5–5.3)
Sodium: 136 mmol/L (ref 135–146)

## 2015-11-03 LAB — TSH: TSH: 1.3 mIU/L (ref 0.40–4.50)

## 2015-11-03 LAB — HEMOGLOBIN A1C
Hgb A1c MFr Bld: 11.1 % — ABNORMAL HIGH (ref <5.7)
Mean Plasma Glucose: 272 mg/dL

## 2015-11-03 MED ORDER — SITAGLIPTIN PHOS-METFORMIN HCL 50-1000 MG PO TABS: ORAL_TABLET | ORAL | 6 refills | Status: DC

## 2015-11-03 MED ORDER — FLUTICASONE PROPIONATE 50 MCG/ACT NA SUSP: 2.0000 | Freq: Every day | NASAL | 3 refills | Status: DC

## 2015-11-03 MED FILL — JANUMET 50-1,000 MG TABLET: 50-1000 | 30 days supply | Qty: 60 | Fill #0

## 2015-11-03 MED FILL — FLUTICASONE PROP 50 MCG SPR: 50 | 30 days supply | Qty: 16 | Fill #0

## 2015-11-03 NOTE — Progress Notes (Signed)
   Subjective:    Patient ID: Victor Estrada, male    DOB: 1970-04-05, 45 y.o.   MRN: [id number]  HPI DM- chronic problem, pt's last A1C 10.5!  Was referred to Endo- did not go.  On Amaryl, Janumet daily.  On ACE for renal protection.  UTD on foot exam, due for eye exam.  Not checking sugars.  Rare intermittently lows.  No numbness/tingling of hands/feet.    Hyperlipidemia- chronic problem, on Fenofibrate and Simvastatin daily.  Pt has gained 5 lbs since last visit and is now 321!  Denies abd pain, N/V.  HTN- chronic problem, on Lisinopril daily w/ good control.  No CP, SOB, HAs, visual changes, edema.  Allergic rhinitis- pt is taking Allegra daily, out of Flonase.  Asking for refill.  Reports 'constant' sinus congestion and pressure.  OSA- pt is morbidly obese, snores loudly, is not waking well rested, having daytime sleepiness.  Was referred previously for sleep apnea evaluation but did not go.  Asking for re-referral   Review of Systems For ROS see HPI 4    Objective:   Physical Exam  Constitutional: He is oriented to person, place, and time. He appears well-developed and well-nourished. No distress.  obese  HENT:  Head: Normocephalic and atraumatic.  Mouth/Throat: Oropharynx is clear and moist.  Edematous, pale turbinates bilaterally TMs retracted bilaterally  Eyes: Conjunctivae and EOM are normal. Pupils are equal, round, and reactive to light.  Neck: Normal range of motion. Neck supple. No thyromegaly present.  Cardiovascular: Normal rate, regular rhythm, normal heart sounds and intact distal pulses.   No murmur heard. Pulmonary/Chest: Effort normal and breath sounds normal. No respiratory distress.  Abdominal: Soft. Bowel sounds are normal. He exhibits no distension.  Musculoskeletal: He exhibits no edema.  Lymphadenopathy:    He has no cervical adenopathy.  Neurological: He is alert and oriented to person, place, and time. No cranial nerve deficit.  Skin: Skin is warm and  dry.  Psychiatric: He has a normal mood and affect. His behavior is normal.  Vitals reviewed.         Assessment & Plan:

## 2015-11-03 NOTE — Progress Notes (Signed)
Pre visit review using our clinic review tool, if applicable. No additional management support is needed unless otherwise documented below in the visit note. 

## 2015-11-03 NOTE — Assessment & Plan Note (Signed)
New.  Pt is morbidly obese, snoring loudly, and having daytime fatigue so the suspicion is high for OSA.  Pt was referred previously but did not follow up w/ appt.  I stressed to pt that when referrals are placed, he must go.  Stressed need for weight loss to reduce his risk.  Will follow.

## 2015-11-03 NOTE — Assessment & Plan Note (Signed)
Chronic problem.  Adequate control.  Asymptomatic.  Check labs.  No anticipated med changes.  Stressed need for healthy diet and regular exercise.  Will follow.

## 2015-11-03 NOTE — Assessment & Plan Note (Signed)
Deteriorated.  Pt is taking Allegra daily but ran out of Flonase.  OTC product did not provide same relief.  Refill provided.  Discussed rotating OTC antihistamines to avoid tolerance.  If no improvement, will refer to allergist.  Pt expressed understanding and is in agreement w/ plan.

## 2015-11-03 NOTE — Patient Instructions (Addendum)
Schedule your complete physical in 3-4 months  We'll notify you of your lab results and make any changes if needed  We'll call you with your pulmonary appt for the sleep apnea evaluation- please go! Switch the Allegra for Claritin or Zyrtec to boost the responsiveness and restart the Flonase daily Try and make healthy food choices and get regular exercise- you can do it!! SCHEDULE YOUR EYE EXAM!!! Call with any questions or concerns Enjoy the rest of your summer!!!

## 2015-11-03 NOTE — Assessment & Plan Note (Signed)
Ongoing issue for pt.  Never went to Endo as referred.  Pt is not compliant w/ low carb diet or exercise.  Overdue for eye exam- strongly encouraged pt to schedule.  UTD on foot exam.  On ACE for renal protection.  Stressed need for better compliance w/ appts, diet, exercise, and eye exams.  Check labs.  Adjust meds prn.  Will follow closely

## 2015-11-03 NOTE — Assessment & Plan Note (Signed)
Chronic problem.  Tolerating statin w/o difficulty.  Check labs.  Adjust meds prn  

## 2015-11-03 NOTE — Telephone Encounter (Signed)
Patient calling to report he forgot to notify pcp that he needs refill of JANUMET 50-1000 MG tablet.  Pharmacy:  [place], [place] - [place] [phone] (Phone) [phone] (Fax)

## 2015-11-06 ENCOUNTER — Other Ambulatory Visit: Payer: Self-pay | Admitting: Family Medicine

## 2015-11-06 DIAGNOSIS — IMO0001 Reserved for inherently not codable concepts without codable children: Secondary | ICD-10-CM

## 2015-11-06 DIAGNOSIS — E1165 Type 2 diabetes mellitus with hyperglycemia: Principal | ICD-10-CM

## 2015-11-06 MED ORDER — EMPAGLIFLOZIN 10 MG PO TABS: 10.0000 mg | ORAL_TABLET | Freq: Every day | ORAL | 6 refills | Status: DC

## 2015-11-06 MED FILL — JARDIANCE 10 MG TABLET: 10 | 30 days supply | Qty: 30 | Fill #0

## 2015-11-10 ENCOUNTER — Other Ambulatory Visit: Payer: Self-pay | Admitting: Family Medicine

## 2015-11-10 MED FILL — SIMVASTATIN 20 MG TABLET: 20 | 90 days supply | Qty: 90 | Fill #0

## 2015-11-10 MED FILL — BUPROPION HCL XL 300 MG TAB: 300 | 30 days supply | Qty: 30 | Fill #2

## 2015-11-15 ENCOUNTER — Ambulatory Visit: Payer: Self-pay | Admitting: Family Medicine

## 2015-11-21 MED FILL — LISINOPRIL 20 MG TABLET: 20 | 90 days supply | Qty: 90 | Fill #1

## 2015-11-21 MED FILL — GLIMEPIRIDE 4 MG TABLET: 4 | 30 days supply | Qty: 60 | Fill #2

## 2015-11-28 ENCOUNTER — Encounter: Payer: Self-pay | Admitting: Endocrinology

## 2015-12-04 MED FILL — JANUMET 50-1,000 MG TABLET: 50-1000 | 30 days supply | Qty: 60 | Fill #1

## 2015-12-04 MED FILL — TRINTELLIX 10 MG TABLET: 10 | 30 days supply | Qty: 30 | Fill #0

## 2015-12-04 MED FILL — FLUTICASONE PROP 50 MCG SPR: 50 | 30 days supply | Qty: 16 | Fill #1

## 2015-12-12 MED FILL — PANTOPRAZOLE SOD DR 40 MG T: 40 | 90 days supply | Qty: 90 | Fill #1

## 2015-12-25 DIAGNOSIS — F330 Major depressive disorder, recurrent, mild: Secondary | ICD-10-CM | POA: Diagnosis not present

## 2015-12-25 DIAGNOSIS — F411 Generalized anxiety disorder: Secondary | ICD-10-CM | POA: Diagnosis not present

## 2015-12-28 MED FILL — GLIMEPIRIDE 4 MG TABLET: 4 | 30 days supply | Qty: 60 | Fill #3

## 2015-12-28 MED FILL — BUPROPION HCL XL 300 MG TAB: 300 | 30 days supply | Qty: 30 | Fill #0

## 2015-12-28 MED FILL — TRINTELLIX 20 MG TABLET: 20 | 30 days supply | Qty: 30 | Fill #0

## 2016-01-09 MED FILL — FLUTICASONE PROP 50 MCG SPR: 50 | 30 days supply | Qty: 16 | Fill #2

## 2016-01-09 MED FILL — JANUMET 50-1,000 MG TABLET: 50-1000 | 30 days supply | Qty: 60 | Fill #2

## 2016-01-10 ENCOUNTER — Encounter: Payer: Self-pay | Admitting: General Practice

## 2016-01-12 ENCOUNTER — Telehealth: Payer: Self-pay | Admitting: Family Medicine

## 2016-01-12 NOTE — Telephone Encounter (Signed)
Patient dismissed from [facility] by Annye Asa MD , effective January 10, 2016. Dismissal letter sent out by certified / registered mail. DAJ

## 2016-01-18 NOTE — Telephone Encounter (Signed)
Received signed domestic return receipt verifying delivery of certified letter on January 17, 2016. Article number [id number] [place] DAJ

## 2016-01-19 ENCOUNTER — Encounter: Payer: Self-pay | Admitting: Pulmonary Disease

## 2016-01-19 ENCOUNTER — Ambulatory Visit (INDEPENDENT_AMBULATORY_CARE_PROVIDER_SITE_OTHER): Payer: 59 | Admitting: Pulmonary Disease

## 2016-01-19 VITALS — BP 110/76 | HR 98 | Ht 70.0 in | Wt 319.2 lb

## 2016-01-19 DIAGNOSIS — J301 Allergic rhinitis due to pollen: Secondary | ICD-10-CM | POA: Diagnosis not present

## 2016-01-19 DIAGNOSIS — G4710 Hypersomnia, unspecified: Secondary | ICD-10-CM | POA: Diagnosis not present

## 2016-01-19 NOTE — Assessment & Plan Note (Signed)
Patient has snoring, witnessed apneas, occasional gasping or choking. 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 He ends up sleeping 1-2 am, wakes up atr 10 am.  Sleeps 6 hrs. Occasional snoring.   He has RLS but over all better. Not on meds.   (-) abnormal behavior in sleep. Has leg jerking but better.  He had a sleep study in the lab 10 yrs ago which was (-).   Has hypersomnia affecting her fxnality. ESS 10.   Plan :  We discussed about the diagnosis of Obstructive Sleep Apnea (OSA) and implications of untreated OSA. We discussed about CPAP and BiPaP as possible treatment options.    We will schedule the patient for a sleep study. Plan for a HST. He sleeps late so HST is better. Wife has OSA. Anticipate no issues with CPAP per se. He has chronic sinus issues. May benefit from full face mask. Hopefully, restless legs will be better once on CPAP.   Patient was instructed to call the office if he/she has not heard back from the office 1-2 weeks after the sleep study.   Patient was instructed to call the office if he/she is having issues with the PAP device.   We discussed good sleep hygiene.   Patient was advised not to engage in activities requiring concentration and/or vigilance if he/she is sleepy.  Patient was advised not to drive if he/she is sleepy.

## 2016-01-19 NOTE — Patient Instructions (Signed)

## 2016-01-19 NOTE — Progress Notes (Signed)
Subjective:    Patient ID: Victor Estrada, male    DOB: [birth year], 45 y.o.   MRN: [id number]  HPI   This is the case of Victor Estrada, 45 y.o. Male, who was referred by Dr. Annye Asa  in consultation regarding possible OSA.   As you very well know, patient is a non smoker, not known to have asthma or copd.   Patient has snoring, witnessed apneas, occasional gasping or choking. 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 He ends up sleeping 1-2 am, wakes up atr 10 am.  Sleeps 6 hrs. Occasional snoring.   He has RLS but over all better. Not on meds.   (-) abnormal behavior in sleep. Has leg jerking but better.  He had a sleep study in the lab 10 yrs ago which was (-).   Has hypersomnia affecting her fxnality. ESS 10.        Review of Systems  Constitutional: Negative.  Negative for fever and unexpected weight change.  HENT: Positive for congestion and sinus pressure. Negative for dental problem, ear pain, nosebleeds, postnasal drip, rhinorrhea, sneezing, sore throat and trouble swallowing.   Eyes: Negative.  Negative for redness and itching.  Respiratory: Positive for chest tightness and shortness of breath. Negative for cough and wheezing.   Cardiovascular: Negative.  Negative for palpitations and leg swelling.  Gastrointestinal: Positive for nausea. Negative for vomiting.  Endocrine: Negative.   Genitourinary: Negative.  Negative for dysuria.  Musculoskeletal: Negative.  Negative for joint swelling.  Skin: Negative.  Negative for rash.  Allergic/Immunologic: Positive for environmental allergies.  Neurological: Negative.  Negative for headaches.  Hematological: Negative.  Does not bruise/bleed easily.  Psychiatric/Behavioral: Negative.  Negative for dysphoric mood. The patient is not nervous/anxious.    Past Medical History:  Diagnosis Date  . Anxiety   . Complication of anesthesia   . Constipation   . Depression   . Diabetes mellitus   . GERD  (gastroesophageal reflux disease)   . Head injury, acute, without loss of consciousness   . Hyperlipidemia   . Hypertension   . Kidney stone   . PONV (postoperative nausea and vomiting)    post wisdom teeth  . Shortness of breath    with exertion  . Sinus disorder    (-) CA, DVT  Family History  Problem Relation Age of Onset  . Hyperlipidemia Father   . Heart disease Father   . Diabetes Father   . Heart disease Maternal Grandmother   . Diabetes Paternal Grandmother   . Cancer Mother      Past Surgical History:  Procedure Laterality Date  . DENTAL SURGERY    . INCISION AND DRAINAGE ABSCESS Left 11/12/2013   Procedure: INCISION AND DRAINAGE LEFT CHEEK  ABSCESS;  Surgeon: Melida Quitter, MD;  Location: [facility];  Service: ENT;  Laterality: Left;  . WISDOM TOOTH EXTRACTION      Social History   Social History  . Marital status: Married    Spouse name: N/A  . Number of children: N/A  . Years of education: N/A   Occupational History  . Not on file.   Social History Main Topics  . Smoking status: Never Smoker  . Smokeless tobacco: Never Used  . Alcohol use No  . Drug use: No  . Sexual activity: Not on file   Other Topics Concern  . Not on file   Social History Narrative   Married 1 son   Dance movement psychotherapist  2-3 caffeine drinks/day   Lives in [place].   No Known Allergies   Outpatient Medications Prior to Visit  Medication Sig Dispense Refill  . acetaminophen (TYLENOL) 500 MG tablet Take 1,000 mg by mouth every 6 (six) hours as needed (for pain).     Marland Kitchen aspirin EC 81 MG tablet Take 162 mg by mouth daily.    Marland Kitchen buPROPion (WELLBUTRIN XL) 300 MG 24 hr tablet Take 300 mg by mouth daily.    . empagliflozin (JARDIANCE) 10 MG TABS tablet Take 10 mg by mouth daily. 30 tablet 6  . fluticasone (FLONASE) 50 MCG/ACT nasal spray Place 2 sprays into both nostrils daily. 16 g 3  . glimepiride (AMARYL) 4 MG tablet TAKE 1 TABLET (4 MG TOTAL) BY MOUTH 2 (TWO) TIMES DAILY. 60  tablet 6  . lisinopril (PRINIVIL,ZESTRIL) 20 MG tablet TAKE 1 TABLET (20 MG TOTAL) BY MOUTH DAILY. 90 tablet 1  . Multiple Vitamin (MULTIVITAMIN) tablet Take 1 tablet by mouth daily.    . pantoprazole (PROTONIX) 40 MG tablet TAKE 1 TABLET (40 MG TOTAL) BY MOUTH DAILY. 30 tablet 6  . simvastatin (ZOCOR) 20 MG tablet TAKE 1 TABLET BY MOUTH DAILY EVERY EVENING 30 tablet 6  . sitaGLIPtin-metformin (JANUMET) 50-1000 MG tablet TAKE 1 TABLET BY MOUTH 2 (TWO) TIMES DAILY WITH A MEAL. 60 tablet 6  . TRINTELLIX 10 MG TABS Take 1 tablet by mouth daily.    . Fenofibrate 150 MG CAPS Take 150 mg by mouth daily.     No facility-administered medications prior to visit.    No orders of the defined types were placed in this encounter.        Objective:   Physical Exam  Vitals:  Vitals:   01/19/16 1158  BP: 110/76  Pulse: 98  SpO2: 95%  Weight: (!) 319 lb 3.2 oz (144.8 kg)  Height: 5\' 10"  (1.778 m)    Constitutional/General:  Pleasant, well-nourished, well-developed, not in any distress,  Comfortably seating.  Well kempt  Body mass index is 45.8 kg/m. Wt Readings from Last 3 Encounters:  01/19/16 (!) 319 lb 3.2 oz (144.8 kg)  11/03/15 (!) 321 lb (145.6 kg)  02/06/15 (!) 316 lb 2 oz (143.4 kg)    Neck circumference: 19 incehs  HEENT: Pupils equal and reactive to light and accommodation. Anicteric sclerae. Normal nasal mucosa.   No oral  lesions,  mouth clear,  oropharynx clear, no postnasal drip. (-) Oral thrush. No dental caries.  Airway - Mallampati class IV  Neck: No masses. Midline trachea. No JVD, (-) LAD. (-) bruits appreciated.  Respiratory/Chest: Grossly normal chest. (-) deformity. (-) Accessory muscle use.  Symmetric expansion. (-) Tenderness on palpation.  Resonant on percussion.  Diminished BS on both lower lung zones. (-) wheezing, crackles, rhonchi (-) egophony  Cardiovascular: Regular rate and  rhythm, heart sounds normal, no murmur or gallops, no peripheral  edema  Gastrointestinal:  Normal bowel sounds. Soft, non-tender. No hepatosplenomegaly.  (-) masses.   Musculoskeletal:  Normal muscle tone. Normal gait.   Extremities: Grossly normal. (-) clubbing, cyanosis.  (-) edema  Skin: (-) rash,lesions seen.   Neurological/Psychiatric : alert, oriented to time, place, person. Normal mood and affect          Assessment & Plan:  Hypersomnia Patient has snoring, witnessed apneas, occasional gasping or choking. 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 He ends up sleeping 1-2 am, wakes up atr 10 am.  Sleeps 6 hrs. Occasional snoring.   He  has RLS but over all better. Not on meds.   (-) abnormal behavior in sleep. Has leg jerking but better.  He had a sleep study in the lab 10 yrs ago which was (-).   Has hypersomnia affecting her fxnality. ESS 10.   Plan :  We discussed about the diagnosis of Obstructive Sleep Apnea (OSA) and implications of untreated OSA. We discussed about CPAP and BiPaP as possible treatment options.    We will schedule the patient for a sleep study. Plan for a HST. He sleeps late so HST is better. Wife has OSA. Anticipate no issues with CPAP per se. He has chronic sinus issues. May benefit from full face mask. Hopefully, restless legs will be better once on CPAP.   Patient was instructed to call the office if he/she has not heard back from the office 1-2 weeks after the sleep study.   Patient was instructed to call the office if he/she is having issues with the PAP device.   We discussed good sleep hygiene.   Patient was advised not to engage in activities requiring concentration and/or vigilance if he/she is sleepy.  Patient was advised not to drive if he/she is sleepy.   Morbid obesity Weight reduction.   Allergic rhinitis On H2 blockers. May need flonase. May need a FFM.      Thank you very much for letting me participate in this patient's care. Please do not hesitate to give me a call if you  have any questions or concerns regarding the treatment plan.   Patient will follow up with me in 6-8 weeks.     Monica Becton, MD 01/19/2016   12:28 PM [facility] Pager: [phone] Office: [phone], Fax: [phone]

## 2016-01-19 NOTE — Assessment & Plan Note (Signed)
Weight reduction 

## 2016-01-19 NOTE — Assessment & Plan Note (Signed)
On H2 blockers. May need flonase. May need a FFM.

## 2016-01-29 MED FILL — BUPROPION HCL XL 300 MG TAB: 300 | 30 days supply | Qty: 30 | Fill #1

## 2016-01-29 MED FILL — TRINTELLIX 20 MG TABLET: 20 | 30 days supply | Qty: 30 | Fill #1

## 2016-01-29 MED FILL — GLIMEPIRIDE 4 MG TABLET: 4 | 30 days supply | Qty: 60 | Fill #4

## 2016-02-07 MED FILL — SIMVASTATIN 20 MG TABLET: 20 | 90 days supply | Qty: 90 | Fill #1

## 2016-02-07 MED FILL — JANUMET 50-1,000 MG TABLET: 50-1000 | 30 days supply | Qty: 60 | Fill #3

## 2016-02-19 ENCOUNTER — Other Ambulatory Visit: Payer: Self-pay | Admitting: Family Medicine

## 2016-02-23 ENCOUNTER — Encounter: Payer: Self-pay | Admitting: Family Medicine

## 2016-02-23 DIAGNOSIS — G4733 Obstructive sleep apnea (adult) (pediatric): Secondary | ICD-10-CM | POA: Diagnosis not present

## 2016-02-24 MED ORDER — LISINOPRIL 20 MG PO TABS: ORAL_TABLET | ORAL | 0 refills | Status: DC

## 2016-02-26 ENCOUNTER — Other Ambulatory Visit: Payer: Self-pay | Admitting: General Practice

## 2016-02-26 MED ORDER — LISINOPRIL 20 MG PO TABS
ORAL_TABLET | ORAL | 0 refills | Status: DC
Start: 2016-02-26 — End: 2020-07-04

## 2016-02-26 MED FILL — GLIMEPIRIDE 4 MG TABLET: 4 | 30 days supply | Qty: 60 | Fill #5

## 2016-02-26 MED FILL — BUPROPION HCL XL 300 MG TAB: 300 | 30 days supply | Qty: 30 | Fill #2

## 2016-02-26 MED FILL — TRINTELLIX 20 MG TABLET: 20 | 30 days supply | Qty: 30 | Fill #2

## 2016-02-26 MED FILL — LISINOPRIL 20 MG TABLET: 20 | 90 days supply | Qty: 90 | Fill #0

## 2016-02-29 ENCOUNTER — Telehealth: Payer: Self-pay | Admitting: Pulmonary Disease

## 2016-02-29 DIAGNOSIS — G4733 Obstructive sleep apnea (adult) (pediatric): Secondary | ICD-10-CM

## 2016-02-29 NOTE — Telephone Encounter (Signed)
  Please call the pt and tell the pt the Ronan  showed OSA.  Pt stops breathing 43   times an hour.   Home sleep study was done on : 02/23/16  Please order autoCPAP 5-15 cm H2O. Patient will need a mask fitting session. Patient will need a 1 month download. If OSA is not corrected on autocpap, he will need a bipap titration study.   Patient needs to be seen by me or any of the NPs/APPs  4-6 weeks after obtaining the cpap machine. Let me know if you receive this.   Thanks!   J. Shirl Harris, MD 02/29/2016, 12:43 PM

## 2016-03-01 ENCOUNTER — Ambulatory Visit: Payer: 59 | Admitting: Adult Health

## 2016-03-01 ENCOUNTER — Other Ambulatory Visit: Payer: Self-pay | Admitting: *Deleted

## 2016-03-01 DIAGNOSIS — G4710 Hypersomnia, unspecified: Secondary | ICD-10-CM

## 2016-03-01 DIAGNOSIS — G4733 Obstructive sleep apnea (adult) (pediatric): Secondary | ICD-10-CM | POA: Diagnosis not present

## 2016-03-01 NOTE — Telephone Encounter (Signed)
LMOMTCB x 1 

## 2016-03-01 NOTE — Telephone Encounter (Signed)
Pt aware of results. Order has been placed and pt requested order be sent to [facility]. F/u appointment made with TP for 05-03-16 @ 10:15a Pt voiced understanding and had no further questions. Nothing further needed.

## 2016-03-01 NOTE — Telephone Encounter (Signed)
Pt returning call to get results.Victor Estrada ° °

## 2016-03-13 DIAGNOSIS — G4733 Obstructive sleep apnea (adult) (pediatric): Secondary | ICD-10-CM | POA: Diagnosis not present

## 2016-03-13 DIAGNOSIS — Z7984 Long term (current) use of oral hypoglycemic drugs: Secondary | ICD-10-CM | POA: Diagnosis not present

## 2016-03-13 DIAGNOSIS — I10 Essential (primary) hypertension: Secondary | ICD-10-CM | POA: Diagnosis not present

## 2016-03-13 DIAGNOSIS — K219 Gastro-esophageal reflux disease without esophagitis: Secondary | ICD-10-CM | POA: Diagnosis not present

## 2016-03-13 DIAGNOSIS — E1165 Type 2 diabetes mellitus with hyperglycemia: Secondary | ICD-10-CM | POA: Diagnosis not present

## 2016-03-13 DIAGNOSIS — E785 Hyperlipidemia, unspecified: Secondary | ICD-10-CM | POA: Diagnosis not present

## 2016-03-13 DIAGNOSIS — F329 Major depressive disorder, single episode, unspecified: Secondary | ICD-10-CM | POA: Diagnosis not present

## 2016-03-20 ENCOUNTER — Encounter: Payer: 59 | Admitting: Family Medicine

## 2016-03-22 MED FILL — JANUMET 50-1,000 MG TABLET: 50-1000 | 30 days supply | Qty: 60 | Fill #4 | Status: TO

## 2016-03-28 DIAGNOSIS — G4733 Obstructive sleep apnea (adult) (pediatric): Secondary | ICD-10-CM | POA: Diagnosis not present

## 2016-04-03 MED FILL — BUPROPION HCL XL 300 MG TAB: 300 | 30 days supply | Qty: 30 | Fill #0

## 2016-04-03 MED FILL — TRINTELLIX 20 MG TABLET: 20 | 30 days supply | Qty: 30 | Fill #0

## 2016-04-04 ENCOUNTER — Ambulatory Visit: Payer: 59 | Admitting: Adult Health

## 2016-04-04 DIAGNOSIS — J343 Hypertrophy of nasal turbinates: Secondary | ICD-10-CM | POA: Diagnosis not present

## 2016-04-04 DIAGNOSIS — J342 Deviated nasal septum: Secondary | ICD-10-CM | POA: Diagnosis not present

## 2016-04-04 DIAGNOSIS — G4733 Obstructive sleep apnea (adult) (pediatric): Secondary | ICD-10-CM | POA: Diagnosis not present

## 2016-04-15 NOTE — Progress Notes (Signed)
Chart reviewed by Dr Jenita Seashore, due to patient hx severe OSA showing AHI 43.7 and not being able to wear CPAP after surgery or have appropriate monitoring post op, he will need to be done at main OR. Amber at Dr Redmond Baseman office notified.

## 2016-04-19 MED FILL — JARDIANCE 10 MG TABLET: 10 | 30 days supply | Qty: 30 | Fill #1

## 2016-04-23 ENCOUNTER — Encounter (HOSPITAL_COMMUNITY)
Admission: RE | Admit: 2016-04-23 | Discharge: 2016-04-23 | Disposition: A | Discharge status: Home / Self Care | Payer: 59 | Source: Ambulatory Visit | Attending: Otolaryngology | Admitting: Otolaryngology

## 2016-04-23 ENCOUNTER — Encounter (HOSPITAL_COMMUNITY): Payer: Self-pay

## 2016-04-23 ENCOUNTER — Other Ambulatory Visit: Payer: Self-pay

## 2016-04-23 DIAGNOSIS — J343 Hypertrophy of nasal turbinates: Secondary | ICD-10-CM | POA: Diagnosis not present

## 2016-04-23 DIAGNOSIS — J342 Deviated nasal septum: Secondary | ICD-10-CM | POA: Diagnosis not present

## 2016-04-23 DIAGNOSIS — G4733 Obstructive sleep apnea (adult) (pediatric): Secondary | ICD-10-CM | POA: Diagnosis not present

## 2016-04-23 HISTORY — DX: Personal history of urinary calculi: Z87.442

## 2016-04-23 HISTORY — DX: Sleep apnea, unspecified: G47.30

## 2016-04-23 LAB — BASIC METABOLIC PANEL
Anion gap: 9 (ref 5–15)
BUN: 19 mg/dL (ref 6–20)
CO2: 25 mmol/L (ref 22–32)
Calcium: 9.8 mg/dL (ref 8.9–10.3)
Chloride: 104 mmol/L (ref 101–111)
Creatinine, Ser: 1.29 mg/dL — ABNORMAL HIGH (ref 0.61–1.24)
GFR calc Af Amer: >60 mL/min (ref >60)
GFR calc non Af Amer: >60 mL/min (ref >60)
Glucose, Bld: 233 mg/dL — ABNORMAL HIGH (ref 65–99)
Potassium: 5.1 mmol/L (ref 3.5–5.1)
Sodium: 138 mmol/L (ref 135–145)

## 2016-04-23 LAB — CBC
HCT: 44.4 % (ref 39.0–52.0)
Hemoglobin: 14.7 g/dL (ref 13.0–17.0)
MCH: 28.1 pg (ref 26.0–34.0)
MCHC: 33.1 g/dL (ref 30.0–36.0)
MCV: 84.7 fL (ref 78.0–100.0)
Platelets: 279 10*3/uL (ref 150–400)
RBC: 5.24 MIL/uL (ref 4.22–5.81)
RDW: 14 % (ref 11.5–15.5)
WBC: 9.7 10*3/uL (ref 4.0–10.5)

## 2016-04-23 LAB — GLUCOSE, CAPILLARY: Glucose-Capillary: 201 mg/dL — ABNORMAL HIGH (ref 65–99)

## 2016-04-23 NOTE — Progress Notes (Addendum)
PCP: Dr. Lennette Bihari Via  Cardiologist: Patient denies  EKG: 2016 (in EPIC)  ECHO: Pt denies  Stress Test:Pt  denies  Cardiac Cath: Pt denies  Chest X-ray: denies in past year

## 2016-04-23 NOTE — Pre-Procedure Instructions (Addendum)
Victor Estrada  04/23/2016      [facility] [address] AT [facility] [address] [place] [address] Phone: [phone] Fax: [phone]  [facility] [place], [address] [place] [address] Phone: [phone] Fax: [place], [place] [address] [place] [address] Phone: [phone] Fax: [phone]    Your procedure is scheduled on Friday, February 2 @ 11:20 AM  Report to [facility] admitting at 9:20 AM  Call this number if you have problems the morning of surgery:  [phone]   Remember:  Do not eat food or drink liquids after midnight.  Take these medicines the morning of surgery with A SIP OF WATER Pantoprazole (protonix), bupropion (wellbutrin),Flonase(Fluticasone),Trintellix             Stop taking your Aspirin along with any Vitamins or Herbal Medications. No Goody's,BC's,Aleve,Advil,Motrin,Ibuprofen,or Fish Oil.     How to Manage Your Diabetes Before and After Surgery  Why is it important to control my blood sugar before and after surgery? . Improving blood sugar levels before and after surgery helps healing and can limit problems. . A way of improving blood sugar control is eating a healthy diet by: o  Eating less sugar and carbohydrates o  Increasing activity/exercise o  Talking with your doctor about reaching your blood sugar goals . High blood sugars (greater than 180 mg/dL) can raise your risk of infections and slow your recovery, so you will need to focus on controlling your diabetes during the weeks before surgery. . Make sure that the doctor who takes care of your diabetes knows about your planned surgery including the date and location.  How do I manage my blood sugar before surgery? . Check  your blood sugar at least 4 times a day, starting 2 days before surgery, to make sure that the level is not too high or low. o Check your blood sugar the morning of your surgery when you wake up and every 2 hours until you get to the Short Stay unit. . If your blood sugar is less than 70 mg/dL, you will need to treat for low blood sugar: o Do not take insulin. o Treat a low blood sugar (less than 70 mg/dL) with  cup of clear juice (cranberry or apple), 4 glucose tablets, OR glucose gel. o Recheck blood sugar in 15 minutes after treatment (to make sure it is greater than 70 mg/dL). If your blood sugar is not greater than 70 mg/dL on recheck, call [phone] for further instructions. . Report your blood sugar to the short stay nurse when you get to Short Stay.  . If you are admitted to the hospital after surgery: o Your blood sugar will be checked by the staff and you will probably be given insulin after surgery (instead of oral diabetes medicines) to make sure you have good blood sugar levels. o The goal for blood sugar control after surgery is 80-180 mg/dL.              WHAT DO I DO ABOUT MY DIABETES MEDICATION?   Marland Kitchen Do not take oral diabetes medicines (pills) the morning of surgery.   Reviewed and Endorsed by [facility] Patient Education Committee, August 2015  Do not wear jewelry..  Do not wear lotions, powders, or colognes, or deoderant.    Men may shave face and neck.  Do not bring valuables to the hospital.  [facility] is not responsible for any belongings or valuables.  Contacts, dentures or bridgework may not be worn into surgery.  Leave your suitcase in the car.  After surgery it may be brought to your room.  For patients admitted to the hospital, discharge time will be determined by your treatment team.  Patients discharged the day of surgery will not be allowed to drive home.   [facility] - Preparing for [facility]  Before surgery, you can  play an important role.  Because skin is not sterile, your skin needs to be as free of germs as possible.  You can reduce the number of germs on you skin by washing with CHG (chlorahexidine gluconate) soap before surgery.  CHG is an antiseptic cleaner which kills germs and bonds with the skin to continue killing germs even after washing.  Please DO NOT use if you have an allergy to CHG or antibacterial soaps.  If your skin becomes reddened/irritated stop using the CHG and inform your nurse when you arrive at Short Stay.  Do not shave (including legs and underarms) for at least 48 hours prior to the first CHG shower.  You may shave your face.  Please follow these instructions carefully:   1.  Shower with CHG Soap the night before surgery and the                                morning of Surgery.  2.  If you choose to wash your hair, wash your hair first as usual with your       normal shampoo.  3.  After you shampoo, rinse your hair and body thoroughly to remove the                      Shampoo.  4.  Use CHG as you would any other liquid soap.  You can apply chg directly       to the skin and wash gently with scrungie or a clean washcloth.  5.  Apply the CHG Soap to your body ONLY FROM THE NECK DOWN.        Do not use on open wounds or open sores.  Avoid contact with your eyes,       ears, mouth and genitals (private parts).  Wash genitals (private parts)       with your normal soap.  6.  Wash thoroughly, paying special attention to the area where your surgery        will be performed.  7.  Thoroughly rinse your body with warm water from the neck down.  8.  DO NOT shower/wash with your normal soap after using and rinsing off       the CHG Soap.  9.  Pat yourself dry with a clean towel.            10.  Wear clean pajamas.            11.  Place clean sheets on your bed the night of your first shower and do not        sleep with pets.  Day of Surgery  Do not apply any lotions/deoderants the morning  of surgery.  Please wear clean clothes  to the hospital/surgery center.    Please read over the following fact sheets that you were given. Pain Booklet, Coughing and deep breathing, MRSA Information, and Surgical Site Infection prevention

## 2016-04-24 NOTE — Progress Notes (Signed)
Anesthesia Chart Review:  Pt is a 46 year old male scheduled for nasal septoplasty with turbinate reduction on 04/26/2016 with Melida Quitter, M.D.  - PCP is Dineen Kid, MD  Past medical history includes: HTN, DM, hyperlipidemia, OSA, post-op N/V, GERD. Never smoker. BMI 46.  Medications include: ASA, Jardiance, glimepiride, lisinopril, Protonix, simvastatin, sitagliptin-metformin.  Preoperative labs reviewed.   - Glucose 233.  - HbA1c from PCP's office was 9.7 on 03/13/16. I spoke with pt by telephone about DM control.  His pcp added a DM medication because of above HbA1c results in December, and recent fasting glucose was 140.  I instructed pt to be careful with diet and DM medications and that case could be cancelled if glucose >200 DOS.   EKG 04/23/16: NSR. Nonspecific ST abnormality  If blood glucose acceptable DOS, I anticipate patient can proceed as scheduled.  Willeen Cass, FNP-BC [facility] Short Stay Surgical Center/Anesthesiology Phone: [phone] 04/24/2016 1:03 PM

## 2016-04-26 ENCOUNTER — Encounter (HOSPITAL_COMMUNITY)
Admission: RE | Disposition: A | Discharge status: Home / Self Care | Payer: Self-pay | Source: Ambulatory Visit | Attending: Otolaryngology

## 2016-04-26 ENCOUNTER — Ambulatory Visit (HOSPITAL_COMMUNITY): Payer: 59 | Admitting: Emergency Medicine

## 2016-04-26 ENCOUNTER — Encounter (HOSPITAL_COMMUNITY): Payer: Self-pay | Admitting: *Deleted

## 2016-04-26 ENCOUNTER — Observation Stay (HOSPITAL_COMMUNITY)
Admission: RE | Admit: 2016-04-26 | Discharge: 2016-04-27 | Disposition: A | Discharge status: Home / Self Care | Payer: 59 | Source: Ambulatory Visit | Attending: Otolaryngology | Admitting: Otolaryngology

## 2016-04-26 ENCOUNTER — Ambulatory Visit (HOSPITAL_COMMUNITY): Payer: 59 | Admitting: Anesthesiology

## 2016-04-26 DIAGNOSIS — G4733 Obstructive sleep apnea (adult) (pediatric): Secondary | ICD-10-CM | POA: Diagnosis not present

## 2016-04-26 DIAGNOSIS — J343 Hypertrophy of nasal turbinates: Secondary | ICD-10-CM | POA: Diagnosis not present

## 2016-04-26 DIAGNOSIS — J342 Deviated nasal septum: Principal | ICD-10-CM | POA: Insufficient documentation

## 2016-04-26 DIAGNOSIS — I10 Essential (primary) hypertension: Secondary | ICD-10-CM | POA: Diagnosis not present

## 2016-04-26 HISTORY — PX: NASAL SEPTOPLASTY W/ TURBINOPLASTY: SHX2070

## 2016-04-26 LAB — GLUCOSE, CAPILLARY
Glucose-Capillary: 140 mg/dL — ABNORMAL HIGH (ref 65–99)
Glucose-Capillary: 142 mg/dL — ABNORMAL HIGH (ref 65–99)
Glucose-Capillary: 211 mg/dL — ABNORMAL HIGH (ref 65–99)
Glucose-Capillary: 204 mg/dL — ABNORMAL HIGH (ref 65–99)

## 2016-04-26 SURGERY — SEPTOPLASTY, NOSE, WITH NASAL TURBINATE REDUCTION
Anesthesia: General | Site: Nose

## 2016-04-26 MED ORDER — OXYMETAZOLINE HCL 0.05 % NA SOLN
NASAL | Status: DC | PRN
  Administered 2016-04-26: 1 via TOPICAL

## 2016-04-26 MED ORDER — LISINOPRIL 20 MG PO TABS
20.0000 mg | ORAL_TABLET | Freq: Every day | ORAL | Status: DC
  Administered 2016-04-26: 20 mg via ORAL
  Filled 2016-04-26: qty 1

## 2016-04-26 MED ORDER — PANTOPRAZOLE SODIUM 20 MG PO TBEC
20.0000 mg | DELAYED_RELEASE_TABLET | Freq: Every day | ORAL | Status: DC
  Administered 2016-04-26: 20 mg via ORAL
  Filled 2016-04-26: qty 1

## 2016-04-26 MED ORDER — HYDROMORPHONE HCL 1 MG/ML IJ SOLN
0.2500 mg | INTRAMUSCULAR | Status: DC | PRN
Start: 2016-04-26 — End: 2016-04-26
  Administered 2016-04-26 (×2): 0.5 mg via INTRAVENOUS

## 2016-04-26 MED ORDER — MUPIROCIN 2 % EX OINT
TOPICAL_OINTMENT | CUTANEOUS | Status: AC
  Filled 2016-04-26: qty 22

## 2016-04-26 MED ORDER — PHENYLEPHRINE HCL 10 MG/ML IJ SOLN
INTRAMUSCULAR | Status: DC | PRN
  Administered 2016-04-26: 80 ug via INTRAVENOUS
  Administered 2016-04-26 (×2): 120 ug via INTRAVENOUS

## 2016-04-26 MED ORDER — PROPOFOL 10 MG/ML IV BOLUS
INTRAVENOUS | Status: AC
  Filled 2016-04-26: qty 40

## 2016-04-26 MED ORDER — CEFAZOLIN SODIUM-DEXTROSE 2-3 GM-% IV SOLR
INTRAVENOUS | Status: DC | PRN
  Administered 2016-04-26: 2 g via INTRAVENOUS

## 2016-04-26 MED ORDER — FENTANYL CITRATE (PF) 100 MCG/2ML IJ SOLN
INTRAMUSCULAR | Status: DC | PRN
  Administered 2016-04-26: 100 ug via INTRAVENOUS
  Administered 2016-04-26 (×2): 50 ug via INTRAVENOUS

## 2016-04-26 MED ORDER — SUGAMMADEX SODIUM 200 MG/2ML IV SOLN
INTRAVENOUS | Status: DC | PRN
  Administered 2016-04-26: 200 mg via INTRAVENOUS

## 2016-04-26 MED ORDER — ASPIRIN EC 81 MG PO TBEC
162.0000 mg | DELAYED_RELEASE_TABLET | Freq: Every day | ORAL | Status: DC
  Administered 2016-04-26: 162 mg via ORAL
  Filled 2016-04-26: qty 2

## 2016-04-26 MED ORDER — LIDOCAINE-EPINEPHRINE (PF) 1 %-1:200000 IJ SOLN
INTRAMUSCULAR | Status: AC
  Filled 2016-04-26: qty 30

## 2016-04-26 MED ORDER — SITAGLIPTIN PHOS-METFORMIN HCL 50-1000 MG PO TABS: 1.0000 | ORAL_TABLET | Freq: Every day | ORAL | Status: DC

## 2016-04-26 MED ORDER — SUCCINYLCHOLINE 20MG/ML (10ML) SYRINGE FOR MEDFUSION PUMP - OPTIME
INTRAMUSCULAR | Status: DC | PRN
  Administered 2016-04-26: 120 mg via INTRAVENOUS

## 2016-04-26 MED ORDER — ROCURONIUM BROMIDE 100 MG/10ML IV SOLN
INTRAVENOUS | Status: DC | PRN
  Administered 2016-04-26: 50 mg via INTRAVENOUS

## 2016-04-26 MED ORDER — CEPHALEXIN 500 MG PO CAPS
500.0000 mg | ORAL_CAPSULE | Freq: Three times a day (TID) | ORAL | Status: DC
  Administered 2016-04-26 – 2016-04-27 (×3): 500 mg via ORAL
  Filled 2016-04-26 (×3): qty 1

## 2016-04-26 MED ORDER — CEFAZOLIN SODIUM 1 G IJ SOLR
INTRAMUSCULAR | Status: AC
  Filled 2016-04-26: qty 20

## 2016-04-26 MED ORDER — PHENYLEPHRINE 40 MCG/ML (10ML) SYRINGE FOR IV PUSH (FOR BLOOD PRESSURE SUPPORT)
PREFILLED_SYRINGE | INTRAVENOUS | Status: AC
  Filled 2016-04-26: qty 10

## 2016-04-26 MED ORDER — SUCCINYLCHOLINE CHLORIDE 200 MG/10ML IV SOSY
PREFILLED_SYRINGE | INTRAVENOUS | Status: AC
  Filled 2016-04-26: qty 10

## 2016-04-26 MED ORDER — MIDAZOLAM HCL 2 MG/2ML IJ SOLN
INTRAMUSCULAR | Status: AC
  Filled 2016-04-26: qty 2

## 2016-04-26 MED ORDER — LIDOCAINE-EPINEPHRINE (PF) 1 %-1:200000 IJ SOLN
INTRAMUSCULAR | Status: DC | PRN
  Administered 2016-04-26: 12 mL via INTRADERMAL

## 2016-04-26 MED ORDER — INSULIN ASPART 100 UNIT/ML ~~LOC~~ SOLN
0.0000 [IU] | Freq: Three times a day (TID) | SUBCUTANEOUS | Status: DC
  Administered 2016-04-26 – 2016-04-27 (×2): 5 [IU] via SUBCUTANEOUS

## 2016-04-26 MED ORDER — VORTIOXETINE HBR 20 MG PO TABS
20.0000 mg | ORAL_TABLET | Freq: Every day | ORAL | Status: DC
  Filled 2016-04-26 (×2): qty 20

## 2016-04-26 MED ORDER — SODIUM CHLORIDE 0.9 % IR SOLN
Status: DC | PRN
  Administered 2016-04-26: 1

## 2016-04-26 MED ORDER — CANAGLIFLOZIN 100 MG PO TABS
100.0000 mg | ORAL_TABLET | Freq: Every day | ORAL | Status: DC
  Administered 2016-04-27: 100 mg via ORAL
  Filled 2016-04-26: qty 1

## 2016-04-26 MED ORDER — ALBUTEROL SULFATE HFA 108 (90 BASE) MCG/ACT IN AERS
INHALATION_SPRAY | RESPIRATORY_TRACT | Status: DC | PRN
  Administered 2016-04-26: 6 via RESPIRATORY_TRACT

## 2016-04-26 MED ORDER — ONDANSETRON HCL 4 MG/2ML IJ SOLN
INTRAMUSCULAR | Status: DC | PRN
  Administered 2016-04-26: 4 mg via INTRAVENOUS

## 2016-04-26 MED ORDER — 0.9 % SODIUM CHLORIDE (POUR BTL) OPTIME
TOPICAL | Status: DC | PRN
  Administered 2016-04-26: 1000 mL

## 2016-04-26 MED ORDER — POTASSIUM CHLORIDE IN NACL 20-0.45 MEQ/L-% IV SOLN
INTRAVENOUS | Status: DC
  Administered 2016-04-26 – 2016-04-27 (×2): via INTRAVENOUS
  Filled 2016-04-26 (×2): qty 1000

## 2016-04-26 MED ORDER — SIMVASTATIN 20 MG PO TABS
20.0000 mg | ORAL_TABLET | Freq: Every day | ORAL | Status: DC
  Administered 2016-04-26: 20 mg via ORAL
  Filled 2016-04-26: qty 1

## 2016-04-26 MED ORDER — SALINE SPRAY 0.65 % NA SOLN
2.0000 | NASAL | Status: DC
  Administered 2016-04-26 – 2016-04-27 (×7): 2 via NASAL
  Filled 2016-04-26: qty 44

## 2016-04-26 MED ORDER — LIDOCAINE 2% (20 MG/ML) 5 ML SYRINGE
INTRAMUSCULAR | Status: AC
  Filled 2016-04-26: qty 5

## 2016-04-26 MED ORDER — MENTHOL 3 MG MT LOZG
1.0000 | LOZENGE | OROMUCOSAL | Status: DC | PRN
  Administered 2016-04-26: 3 mg via ORAL
  Filled 2016-04-26: qty 9

## 2016-04-26 MED ORDER — HYDROCODONE-ACETAMINOPHEN 5-325 MG PO TABS: 1.0000 | ORAL_TABLET | Freq: Four times a day (QID) | ORAL | 0 refills | Status: DC | PRN

## 2016-04-26 MED ORDER — MUPIROCIN 2 % EX OINT
TOPICAL_OINTMENT | CUTANEOUS | Status: DC | PRN
  Administered 2016-04-26: 1 via NASAL

## 2016-04-26 MED ORDER — OXYMETAZOLINE HCL 0.05 % NA SOLN
NASAL | Status: AC
  Filled 2016-04-26: qty 15

## 2016-04-26 MED ORDER — HYDROCODONE-ACETAMINOPHEN 5-325 MG PO TABS
1.0000 | ORAL_TABLET | ORAL | Status: DC | PRN
  Administered 2016-04-26 – 2016-04-27 (×4): 2 via ORAL
  Filled 2016-04-26 (×5): qty 2

## 2016-04-26 MED ORDER — LIDOCAINE HCL (CARDIAC) 20 MG/ML IV SOLN
INTRAVENOUS | Status: DC | PRN
  Administered 2016-04-26: 100 mg via INTRAVENOUS

## 2016-04-26 MED ORDER — LINAGLIPTIN 5 MG PO TABS
5.0000 mg | ORAL_TABLET | Freq: Every day | ORAL | Status: DC
  Administered 2016-04-27: 5 mg via ORAL
  Filled 2016-04-26: qty 1

## 2016-04-26 MED ORDER — CEPHALEXIN 500 MG PO CAPS: 500.0000 mg | ORAL_CAPSULE | Freq: Three times a day (TID) | ORAL | 0 refills | Status: DC

## 2016-04-26 MED ORDER — MIDAZOLAM HCL 5 MG/5ML IJ SOLN
INTRAMUSCULAR | Status: DC | PRN
  Administered 2016-04-26: 2 mg via INTRAVENOUS

## 2016-04-26 MED ORDER — FLUTICASONE PROPIONATE 50 MCG/ACT NA SUSP
2.0000 | Freq: Every day | NASAL | Status: DC
  Filled 2016-04-26: qty 16

## 2016-04-26 MED ORDER — GLIMEPIRIDE 4 MG PO TABS
4.0000 mg | ORAL_TABLET | Freq: Every day | ORAL | Status: DC
  Administered 2016-04-27: 4 mg via ORAL
  Filled 2016-04-26: qty 1

## 2016-04-26 MED ORDER — PROPOFOL 10 MG/ML IV BOLUS
INTRAVENOUS | Status: DC | PRN
  Administered 2016-04-26: 150 mg via INTRAVENOUS

## 2016-04-26 MED ORDER — INSULIN ASPART 100 UNIT/ML ~~LOC~~ SOLN
0.0000 [IU] | Freq: Every day | SUBCUTANEOUS | Status: DC
  Administered 2016-04-26: 3 [IU] via SUBCUTANEOUS

## 2016-04-26 MED ORDER — ZOLPIDEM TARTRATE 5 MG PO TABS
5.0000 mg | ORAL_TABLET | Freq: Every evening | ORAL | Status: DC | PRN
Start: 2016-04-26 — End: 2016-04-27

## 2016-04-26 MED ORDER — MORPHINE SULFATE (PF) 2 MG/ML IV SOLN
2.0000 mg | INTRAVENOUS | Status: DC | PRN
  Administered 2016-04-26: 2 mg via INTRAVENOUS
  Filled 2016-04-26: qty 1

## 2016-04-26 MED ORDER — BUPROPION HCL ER (XL) 150 MG PO TB24: 300.0000 mg | ORAL_TABLET | Freq: Every day | ORAL | Status: DC

## 2016-04-26 MED ORDER — ACETAMINOPHEN 500 MG PO TABS: 1000.0000 mg | ORAL_TABLET | Freq: Four times a day (QID) | ORAL | Status: DC | PRN

## 2016-04-26 MED ORDER — PROMETHAZINE HCL 25 MG/ML IJ SOLN: 6.2500 mg | INTRAMUSCULAR | Status: DC | PRN

## 2016-04-26 MED ORDER — LACTATED RINGERS IV SOLN
INTRAVENOUS | Status: DC
Start: 2016-04-26 — End: 2016-04-26
  Administered 2016-04-26: 10:00:00 via INTRAVENOUS

## 2016-04-26 MED ORDER — HYDROMORPHONE HCL 1 MG/ML IJ SOLN: 0.2500 mg | INTRAMUSCULAR | Status: DC | PRN

## 2016-04-26 MED ORDER — HYDROMORPHONE HCL 1 MG/ML IJ SOLN
INTRAMUSCULAR | Status: AC
  Filled 2016-04-26: qty 1

## 2016-04-26 MED ORDER — METFORMIN HCL 500 MG PO TABS
1000.0000 mg | ORAL_TABLET | Freq: Every day | ORAL | Status: DC
  Filled 2016-04-26: qty 2

## 2016-04-26 MED ORDER — ADULT MULTIVITAMIN W/MINERALS CH: 1.0000 | ORAL_TABLET | Freq: Every day | ORAL | Status: DC

## 2016-04-26 MED ORDER — OXYMETAZOLINE HCL 0.05 % NA SOLN
2.0000 | Freq: Two times a day (BID) | NASAL | Status: DC | PRN
  Filled 2016-04-26: qty 15

## 2016-04-26 MED ORDER — FENTANYL CITRATE (PF) 100 MCG/2ML IJ SOLN
INTRAMUSCULAR | Status: AC
  Filled 2016-04-26: qty 4

## 2016-04-26 MED FILL — JANUMET 50-1,000 MG TABLET: 50-1000 | 30 days supply | Qty: 60 | Fill #0

## 2016-04-26 SURGICAL SUPPLY — 23 items
BLADE INF TURB ROT M4 2 5PK (BLADE) ×2 IMPLANT
CANISTER SUCTION 2500CC (MISCELLANEOUS) ×3 IMPLANT
DRAPE PROXIMA HALF (DRAPES) ×2 IMPLANT
DRSG TELFA 3X8 NADH (GAUZE/BANDAGES/DRESSINGS) IMPLANT
GLOVE BIO SURGEON STRL SZ7.5 (GLOVE) ×2 IMPLANT
GOWN STRL REUS W/ TWL LRG LVL3 (GOWN DISPOSABLE) ×2 IMPLANT
GOWN STRL REUS W/TWL LRG LVL3 (GOWN DISPOSABLE) ×4
KIT BASIN OR (CUSTOM PROCEDURE TRAY) ×2 IMPLANT
KIT ROOM TURNOVER OR (KITS) ×2 IMPLANT
NDL HYPO 25GX1X1/2 BEV (NEEDLE) IMPLANT
NDL HYPO 25X1 1.5 SAFETY (NEEDLE) ×1 IMPLANT
NEEDLE HYPO 25GX1X1/2 BEV (NEEDLE) ×2 IMPLANT
NEEDLE HYPO 25X1 1.5 SAFETY (NEEDLE) ×2 IMPLANT
NS IRRIG 1000ML POUR BTL (IV SOLUTION) ×2 IMPLANT
PAD ARMBOARD 7.5X6 YLW CONV (MISCELLANEOUS) ×2 IMPLANT
PAD DRESSING TELFA 3X8 NADH (GAUZE/BANDAGES/DRESSINGS) ×1 IMPLANT
PATTIES SURGICAL .5 X3 (DISPOSABLE) ×2 IMPLANT
SOL PREP POV-IOD 4OZ 10% (MISCELLANEOUS) ×1 IMPLANT
SPLINT NASAL DOYLE BI-VL (GAUZE/BANDAGES/DRESSINGS) ×2 IMPLANT
SUT CHROMIC 4 0 PS 2 18 (SUTURE) ×2 IMPLANT
SUT CHROMIC GUT 2 0 PS 2 27 (SUTURE) ×2 IMPLANT
TRAY ENT MC OR (CUSTOM PROCEDURE TRAY) ×2 IMPLANT
TUBE CONNECTING 12X1/4 (SUCTIONS) ×1 IMPLANT

## 2016-04-26 NOTE — Brief Op Note (Signed)
04/26/2016  12:16 PM  PATIENT:  Bradly Bienenstock Bur  46 y.o. male  PRE-OPERATIVE DIAGNOSIS:  SEPTAL DEVIATION, TURBINATE HYPERTROPHY  POST-OPERATIVE DIAGNOSIS:  SEPTAL DEVIATION, TURBINATE HYPERTROPHY  PROCEDURE:  Procedure(s): NASAL SEPTOPLASTY WITH TURBINATE REDUCTION (N/A)  SURGEON:  Surgeon(s) and Role:    * Melida Quitter, MD - Primary  PHYSICIAN ASSISTANT:   ASSISTANTS: none   ANESTHESIA:   general  EBL:  Total I/O In: 1000 [I.V.:1000] Out: -   BLOOD ADMINISTERED:none  DRAINS: none   LOCAL MEDICATIONS USED:  LIDOCAINE   SPECIMEN:  No Specimen  DISPOSITION OF SPECIMEN:  N/A  COUNTS:  YES  TOURNIQUET:  * No tourniquets in log *  DICTATION: .Other Dictation: Dictation Number [id number]  PLAN OF CARE: Admit for overnight observation  PATIENT DISPOSITION:  PACU - hemodynamically stable.   Delay start of Pharmacological VTE agent (>24hrs) due to surgical blood loss or risk of bleeding: yes

## 2016-04-26 NOTE — Progress Notes (Signed)
Received pt from PACU, A&O x4, dressing to nose dry and intact. Pain meds given. Wife at the bedside.

## 2016-04-26 NOTE — Anesthesia Procedure Notes (Signed)
Procedure Name: Intubation Date/Time: 04/26/2016 11:38 AM Performed by: Salli Quarry Tarren Velardi Pre-anesthesia Checklist: Patient identified, Emergency Drugs available, Suction available and Patient being monitored Patient Re-evaluated:Patient Re-evaluated prior to inductionOxygen Delivery Method: Circle System Utilized Preoxygenation: Pre-oxygenation with 100% oxygen Intubation Type: IV induction Ventilation: Mask ventilation without difficulty Laryngoscope Size: Mac and 4 Tube type: Oral Tube size: 7.5 mm Number of attempts: 1 Airway Equipment and Method: Stylet and Oral airway Placement Confirmation: ETT inserted through vocal cords under direct vision,  positive ETCO2 and breath sounds checked- equal and bilateral Secured at: 23 cm Tube secured with: Tape Dental Injury: Teeth and Oropharynx as per pre-operative assessment

## 2016-04-26 NOTE — Anesthesia Preprocedure Evaluation (Signed)
Anesthesia Evaluation  Patient identified by MRN, date of birth, ID band Patient awake    Reviewed: Allergy & Precautions, NPO status , Patient's Chart, lab work & pertinent test results  History of Anesthesia Complications (+) PONV and history of anesthetic complications  Airway Mallampati: II  TM Distance: >3 FB Neck ROM: Full    Dental  (+) Teeth Intact   Pulmonary shortness of breath, sleep apnea ,    breath sounds clear to auscultation       Cardiovascular hypertension,  Rhythm:Regular Rate:Normal     Neuro/Psych    GI/Hepatic GERD  ,  Endo/Other  diabetes, Well ControlledMorbid obesity  Renal/GU Renal InsufficiencyRenal disease     Musculoskeletal   Abdominal (+) + obese,   Peds  Hematology   Anesthesia Other Findings   Reproductive/Obstetrics                             Anesthesia Physical Anesthesia Plan  ASA: III  Anesthesia Plan: General   Post-op Pain Management:    Induction: Intravenous  Airway Management Planned: Oral ETT  Additional Equipment:   Intra-op Plan:   Post-operative Plan: Extubation in OR  Informed Consent: I have reviewed the patients History and Physical, chart, labs and discussed the procedure including the risks, benefits and alternatives for the proposed anesthesia with the patient or authorized representative who has indicated his/her understanding and acceptance.   Dental advisory given  Plan Discussed with: CRNA  Anesthesia Plan Comments:         Anesthesia Quick Evaluation

## 2016-04-26 NOTE — Transfer of Care (Signed)
Immediate Anesthesia Transfer of Care Note  Patient: Victor Estrada  Procedure(s) Performed: Procedure(s): NASAL SEPTOPLASTY WITH TURBINATE REDUCTION (N/A)  Patient Location: PACU  Anesthesia Type:General  Level of Consciousness: awake, alert , oriented and patient cooperative  Airway & Oxygen Therapy: Patient Spontanous Breathing and Patient connected to face mask oxygen  Post-op Assessment: Report given to RN and Post -op Vital signs reviewed and stable  Post vital signs: Reviewed and stable  Last Vitals:  Vitals:   04/26/16 0945  BP: 117/69  Pulse: 100  Resp: 18  Temp: 36.7 C    Last Pain:  Vitals:   04/26/16 0945  TempSrc: Oral      Patients Stated Pain Goal: 7 ([id number])  Complications: No apparent anesthesia complications

## 2016-04-26 NOTE — H&P (Signed)
Victor Estrada is an 46 y.o. male.   Chief Complaint: Nasal obstruction HPI: 46 year old male with obstructive sleep apnea who has had several years of nasal obstruction not responding to medical therapy.  He presents for surgical management.  Past Medical History:  Diagnosis Date  . Anxiety   . Complication of anesthesia   . Constipation   . Depression   . Diabetes mellitus   . GERD (gastroesophageal reflux disease)   . Head injury, acute, without loss of consciousness   . History of kidney stones   . Hyperlipidemia   . Hypertension   . Kidney stone   . PONV (postoperative nausea and vomiting)    post wisdom teeth  . Shortness of breath    with exertion  . Sinus disorder   . Sleep apnea     Past Surgical History:  Procedure Laterality Date  . DENTAL SURGERY    . INCISION AND DRAINAGE ABSCESS Left 11/12/2013   Procedure: INCISION AND DRAINAGE LEFT CHEEK  ABSCESS;  Surgeon: Melida Quitter, MD;  Location: [facility];  Service: ENT;  Laterality: Left;  . WISDOM TOOTH EXTRACTION      Family History  Problem Relation Age of Onset  . Hyperlipidemia Father   . Heart disease Father   . Diabetes Father   . Heart disease Maternal Grandmother   . Diabetes Paternal Grandmother   . Cancer Mother    Social History:  reports that he has never smoked. He has never used smokeless tobacco. He reports that he does not drink alcohol or use drugs.  Allergies: No Known Allergies  Medications Prior to Admission  Medication Sig Dispense Refill  . acetaminophen (TYLENOL) 500 MG tablet Take 1,000 mg by mouth every 6 (six) hours as needed (for pain).     Marland Kitchen aspirin EC 81 MG tablet Take 162 mg by mouth daily.    Marland Kitchen buPROPion (WELLBUTRIN XL) 300 MG 24 hr tablet Take 300 mg by mouth daily.    . empagliflozin (JARDIANCE) 10 MG TABS tablet Take 10 mg by mouth daily. 30 tablet 6  . fluticasone (FLONASE) 50 MCG/ACT nasal spray Place 2 sprays into both nostrils daily. 16 g 3  . glimepiride (AMARYL) 4 MG  tablet TAKE 1 TABLET (4 MG TOTAL) BY MOUTH 2 (TWO) TIMES DAILY. 60 tablet 6  . lisinopril (PRINIVIL,ZESTRIL) 20 MG tablet TAKE 1 TABLET (20 MG TOTAL) BY MOUTH DAILY. 90 tablet 0  . Multiple Vitamin (MULTIVITAMIN) tablet Take 1 tablet by mouth daily.    . pantoprazole (PROTONIX) 20 MG tablet Take 1 tablet by mouth daily.  3  . simvastatin (ZOCOR) 20 MG tablet TAKE 1 TABLET BY MOUTH DAILY EVERY EVENING 30 tablet 6  . sitaGLIPtin-metformin (JANUMET) 50-1000 MG tablet TAKE 1 TABLET BY MOUTH 2 (TWO) TIMES DAILY WITH A MEAL. 60 tablet 6  . TRINTELLIX 20 MG TABS Take 1 tablet by mouth daily.  0    Results for orders placed or performed during the hospital encounter of 04/26/16 (from the past 48 hour(s))  Glucose, capillary     Status: Abnormal   Collection Time: 04/26/16  9:49 AM  Result Value Ref Range   Glucose-Capillary 140 (H) 65 - 99 mg/dL   No results found.  Review of Systems  All other systems reviewed and are negative.   Blood pressure 117/69, pulse 100, temperature 98 F (36.7 C), temperature source Oral, resp. rate 18, height 5\' 10"  (1.778 m), weight (!) 318 lb 12.8 oz (144.6  kg), SpO2 96 %. Physical Exam  Constitutional: He is oriented to person, place, and time. He appears well-developed and well-nourished. No distress.  HENT:  Head: Normocephalic and atraumatic.  Right Ear: External ear normal.  Left Ear: External ear normal.  Nose: Nose normal.  Mouth/Throat: Oropharynx is clear and moist.  Septum deviates to the left.  Turbinates enlarged.  Eyes: Conjunctivae and EOM are normal. Pupils are equal, round, and reactive to light.  Neck: Normal range of motion. Neck supple.  Cardiovascular: Normal rate.   Respiratory: Effort normal.  Musculoskeletal: Normal range of motion.  Neurological: He is alert and oriented to person, place, and time. No cranial nerve deficit.  Skin: Skin is warm and dry.  Psychiatric: He has a normal mood and affect. His behavior is normal. Judgment  and thought content normal.     Assessment/Plan Septal deviation and turbinate hypertrophy with obstructive sleep apnea.  To OR for septoplasty and turbinate reduction.  Plan overnight observation due to sleep apnea.  Melida Quitter, MD 04/26/2016, 10:51 AM

## 2016-04-27 DIAGNOSIS — G4733 Obstructive sleep apnea (adult) (pediatric): Secondary | ICD-10-CM | POA: Diagnosis not present

## 2016-04-27 DIAGNOSIS — J343 Hypertrophy of nasal turbinates: Secondary | ICD-10-CM | POA: Diagnosis not present

## 2016-04-27 DIAGNOSIS — J342 Deviated nasal septum: Secondary | ICD-10-CM | POA: Diagnosis not present

## 2016-04-27 LAB — GLUCOSE, CAPILLARY: Glucose-Capillary: 223 mg/dL — ABNORMAL HIGH (ref 65–99)

## 2016-04-27 NOTE — Progress Notes (Signed)
Discharge instructions gone over with patient. Home medications discussed. Follow up appointment to be made. Prescriptions given. Diet, activity, and reasons to call the doctor gone over. Emphasis placed on completing antibiotics and not blowing nose. Patient verbalized understanding of instructions.

## 2016-04-27 NOTE — Discharge Summary (Signed)
Physician Discharge Summary  Patient ID: Victor Estrada MRN: [id number] DOB/AGE: 1970/07/11 46 y.o.  Admit date: 04/26/2016 Discharge date: 04/27/2016  Admission Diagnoses:deviated septumAnd obstructive sleep apnea  Discharge Diagnoses:  Active Problems:   OSA (obstructive sleep apnea)   Discharged Condition: good  Hospital Course: Patient was admitted for obstructive sleep apnea after septoplasty. Patient's done well. No problems or complaints. No bleeding. He is taking fluids and food well. He's had no bleeding. He's now ready to be discharged.  Consults: None  Significant Diagnostic Studies: None  Treatments: surgery: Septoplasty and turbinate reduction  Discharge Exam: Blood pressure 123/66, pulse 97, temperature 97.4 F (36.3 C), temperature source Oral, resp. rate 18, height 5\' 10"  (1.778 m), weight 135.1 kg (297 lb 12.8 oz), SpO2 96 %. Awake and alert. He is eating breakfast. He has no bleeding from the nose. It's a bit congested. Oral cavity oropharynx no lesions. Heart regular. Lungs normal effort. Abdomen is without tenderness. Stranding is no edema.  Disposition: 01-Home or Self Care  Discharge Instructions    Diet - low sodium heart healthy    Complete by:  As directed    Discharge instructions    Complete by:  As directed    Change nasal drip pad as needed until bleeding stops.  Rest with head elevated.  Use saline spray in each nasal passage every 2-4 hours.  Use Afrin spray if bleeding worsens.  Do not blow the nose.   Increase activity slowly    Complete by:  As directed       Follow-up Information    BATES, DWIGHT, MD. Schedule an appointment as soon as possible for a visit on 05/01/2016.   Specialty:  Otolaryngology Contact information: [address] [place] [address] [phone]           Signed: Melissa Montane 04/27/2016, 9:23 AM

## 2016-04-28 DIAGNOSIS — G4733 Obstructive sleep apnea (adult) (pediatric): Secondary | ICD-10-CM | POA: Diagnosis not present

## 2016-04-29 ENCOUNTER — Encounter (HOSPITAL_COMMUNITY): Payer: Self-pay | Admitting: Otolaryngology

## 2016-04-29 NOTE — Op Note (Signed)
Victor Estrada, Victor Estrada                 ACCOUNT NO.:  [id number]  MEDICAL RECORD NO.:  [id number]  LOCATION:                                 FACILITY:  PHYSICIAN:  Onnie Graham, MD     DATE OF BIRTH:  06/25/1970  DATE OF PROCEDURE:  04/26/2016 DATE OF DISCHARGE:                              OPERATIVE REPORT   PREOPERATIVE DIAGNOSES: 1. Nasal septal deviation. 2. Bilateral turbinate hypertrophy. 3. Obstructive sleep apnea.  POSTOPERATIVE DIAGNOSES: 1. Nasal septal deviation. 2. Bilateral turbinate hypertrophy. 3. Obstructive sleep apnea.  PROCEDURE: 1. Septoplasty. 2. Bilateral inferior turbinate reductions.  SURGEON:  Onnie Graham, MD  ANESTHESIA:  General endotracheal anesthesia.  COMPLICATIONS:  None.  INDICATION:  The patient is a 46 year old male with obstructive sleep apnea, who wears CPAP, who has noticed nasal obstruction for the last several years that has not responded well to medical therapy.  He was found to have a septal deviation and turbinate hypertrophy and presents to the operative room for surgical management.  FINDINGS:  The nasal septum deviates to the left, primarily at the bony cartilaginous junction.  The inferior turbinates were enlarged.  DESCRIPTION OF PROCEDURE:  The patient was identified in the holding room.  Informed consent having been obtained including discussion of risks, benefits, alternatives, the patient was brought to the operative suite, and put on the operative table in supine position.  Anesthesia was induced.  The patient was intubated by the anesthesia team without difficulty.  The patient was given intravenous antibiotics during the case.  The eyes taped closed and the face prepped and draped in sterile fashion.  Afrin-soaked pledgets were placed in both sides of the nose for several minutes and then removed.  The septum was injected on both sides using 1% lidocaine with 1:200,000 epinephrine.  A left-sided Killian incision  was made with a 15 blade scalpel.  A subperichondrial flap was elevated on the left side beyond the bony cartilaginous junction.  The junction was divided with a Soil scientist and soft tissues were elevated off the opposite side of the posterior septum. The bony portion of the septum posteriorly was then removed in a piecemeal fashion partially using various forceps fracturing the bone. The inferior septal bone was then isolated on either side using a Soil scientist.  Some of the cartilage was divided from the quadrangular cartilage along the anterior septal bone and this was removed with the forceps.  The inferior septal bone was then removed using an osteotome. A tear occurred in the left-sided flap during removal of bony tissue posteriorly.  Soft tissues were now re-draped and the septum was found to be in a fairly midline straight position.  At this point, both inferior turbinates were injected with the local anesthetic.  Stab incisions were made in the anterior head of each inferior turbinate and soft tissues were elevated off the underlying bone using a Soil scientist.  The submucosal tissue was then removed using the turbinate blade on the microdebrider, trying to keep the overlying mucosa and underlying bone intact.  This worked very well on the right side, and there was more of a tear on the left  side.  After the soft tissues were reduced, the remaining mucosa was redraped and the turbinates lateralized with a Soil scientist.  The nasal passages were suctioned and a Doyle stent was placed in each side of the nose, coated with Bactroban ointment.  The Killian incision was closed with 4-0 chromic in a simple interrupted fashion.  The stents were placed in the proper position and secured with a single 2-0 chromic suture in through and through mattress stitch.  The oropharynx was then suctioned using a sweetheart retractor and neck air suction.  The drapes were removed and the  patient was returned to anesthesia for wake up, was extubated to the recovery room in stable condition.     Onnie Graham, MD   ______________________________ Onnie Graham, MD    DDB/MEDQ  D:  04/26/2016  T:  04/27/2016  Job:  [id number]

## 2016-04-29 NOTE — Anesthesia Postprocedure Evaluation (Addendum)
Anesthesia Post Note  Patient: Victor Estrada  Procedure(s) Performed: Procedure(s) (LRB): NASAL SEPTOPLASTY WITH TURBINATE REDUCTION (N/A)  Patient location during evaluation: PACU Anesthesia Type: General Level of consciousness: awake and alert Pain management: pain level controlled Vital Signs Assessment: post-procedure vital signs reviewed and stable Respiratory status: spontaneous breathing and respiratory function stable Cardiovascular status: blood pressure returned to baseline and stable Postop Assessment: no signs of nausea or vomiting Anesthetic complications: no       Last Vitals:  Vitals:   04/27/16 0510 04/27/16 0900  BP: 123/66 128/70  Pulse: 97 96  Resp: 18 18  Temp: 36.3 C 36.6 C    Last Pain:  Vitals:   04/27/16 0950  TempSrc:   PainSc: 2                  Naureen Benton,JAMES TERRILL

## 2016-05-03 ENCOUNTER — Ambulatory Visit: Payer: 59 | Admitting: Adult Health

## 2016-05-03 MED FILL — GLIMEPIRIDE 4 MG TABLET: 4 | 30 days supply | Qty: 60 | Fill #6

## 2016-05-06 MED FILL — BUPROPION HCL XL 300 MG TAB: 300 | 30 days supply | Qty: 30 | Fill #0

## 2016-05-08 DIAGNOSIS — F411 Generalized anxiety disorder: Secondary | ICD-10-CM | POA: Diagnosis not present

## 2016-05-08 DIAGNOSIS — F330 Major depressive disorder, recurrent, mild: Secondary | ICD-10-CM | POA: Diagnosis not present

## 2016-05-10 MED FILL — TRINTELLIX 20 MG TABLET: 20 | 90 days supply | Qty: 90 | Fill #0

## 2016-05-22 ENCOUNTER — Encounter: Payer: 59 | Attending: Family Medicine | Admitting: *Deleted

## 2016-05-22 DIAGNOSIS — E1165 Type 2 diabetes mellitus with hyperglycemia: Secondary | ICD-10-CM

## 2016-05-22 DIAGNOSIS — Z713 Dietary counseling and surveillance: Secondary | ICD-10-CM | POA: Insufficient documentation

## 2016-05-22 DIAGNOSIS — IMO0001 Reserved for inherently not codable concepts without codable children: Secondary | ICD-10-CM

## 2016-05-22 NOTE — Progress Notes (Signed)
Diabetes Self-Management Education  Visit Type: First/Initial  Appt. Start Time: 1400 Appt. End Time: [id number]  05/22/2016  Mr. Victor Estrada, identified by name and date of birth, is a 46 y.o. male with a diagnosis of Diabetes: Type 2. He is gradually changing his eating habits and is supporting his wife's efforts for weight loss through bariatric surgery, which she had about a month ago.   ASSESSMENT  Height 5\' 10"  (1.778 m), weight (!) 311 lb 8 oz (141.3 kg). Body mass index is 44.7 kg/m.      Diabetes Self-Management Education - 05/22/16 1415      Visit Information   Visit Type First/Initial     Initial Visit   Diabetes Type Type 2   Are you currently following a meal plan? No   Are you taking your medications as prescribed? Yes   Date Diagnosed over 10 years     Health Coping   How would you rate your overall health? Fair     Psychosocial Assessment   Patient Belief/Attitude about Diabetes Denial   Self-care barriers None   Self-management support Family   Other persons present Patient   Patient Concerns Nutrition/Meal planning;Weight Control;Glycemic Control   How often do you need to have someone help you when you read instructions, pamphlets, or other written materials from your doctor or pharmacy? 1 - Never   What is the last grade level you completed in school? 12     Pre-Education Assessment   Patient understands the diabetes disease and treatment process. Needs Instruction   Patient understands incorporating nutritional management into lifestyle. Needs Review   Patient undertands incorporating physical activity into lifestyle. Needs Review   Patient understands using medications safely. Needs Instruction   Patient understands monitoring blood glucose, interpreting and using results Needs Instruction   Patient understands prevention, detection, and treatment of acute complications. Needs Instruction   Patient understands prevention, detection, and treatment of  chronic complications. Needs Review   Patient understands how to develop strategies to address psychosocial issues. Needs Review   Patient understands how to develop strategies to promote health/change behavior. Needs Review     Complications   Last HgB A1C per patient/outside source 9.2 %  Improved from 11%   How often do you check your blood sugar? --  occasionally   Number of hypoglycemic episodes per month 0   Have you had a dilated eye exam in the past 12 months? No   Have you had a dental exam in the past 12 months? Yes   Are you checking your feet? No     Dietary Intake   Breakfast skips OR picks up biscuit with chicken on way to work OR protein bar   Snack (morning) not usually   Lunch frozen Lean Cuisine about 1/week OR subway 6" and baked chips OR burger with fries    Snack (afternoon) occasionally protein bar or nuts   Dinner lean meat, starch and vegetables, tho the starches are limited due to wife's bariatric surgery   Beverage(s) diet soda, occasionally regular soda,      Exercise   Exercise Type Light (walking / raking leaves)   How many days per week to you exercise? 2   How many minutes per day do you exercise? 30   Total minutes per week of exercise 60     Patient Education   Previous Diabetes Education Yes (please comment)   Disease state  Definition of diabetes, type 1 and 2, and the diagnosis  of diabetes   Nutrition management  Role of diet in the treatment of diabetes and the relationship between the three main macronutrients and blood glucose level;Food label reading, portion sizes and measuring food.;Carbohydrate counting   Physical activity and exercise  Role of exercise on diabetes management, blood pressure control and cardiac health.   Medications Reviewed patients medication for diabetes, action, purpose, timing of dose and side effects.;Other (comment)  With continued weight loss, medication doses may need to be decreased   Monitoring Identified  appropriate SMBG and/or A1C goals.   Acute complications --  Discussed risk of hypoglycemia with glmiperide     Individualized Goals (developed by patient)   Nutrition Follow meal plan discussed   Physical Activity Exercise 3-5 times per week   Medications take my medication as prescribed   Monitoring  test blood glucose pre and post meals as discussed     Post-Education Assessment   Patient understands the diabetes disease and treatment process. Demonstrates understanding / competency   Patient understands incorporating nutritional management into lifestyle. Demonstrates understanding / competency   Patient undertands incorporating physical activity into lifestyle. Demonstrates understanding / competency   Patient understands using medications safely. Demonstrates understanding / competency   Patient understands monitoring blood glucose, interpreting and using results Demonstrates understanding / competency   Patient understands prevention, detection, and treatment of acute complications. Demonstrates understanding / competency   Patient understands prevention, detection, and treatment of chronic complications. Demonstrates understanding / competency   Patient understands how to develop strategies to address psychosocial issues. Demonstrates understanding / competency   Patient understands how to develop strategies to promote health/change behavior. Demonstrates understanding / competency     Outcomes   Expected Outcomes Demonstrated interest in learning. Expect positive outcomes   Future DMSE PRN   Program Status Completed      Individualized Plan for Diabetes Self-Management Training:   Learning Objective:  Patient will have a greater understanding of diabetes self-management. Patient education plan is to attend individual and/or group sessions per assessed needs and concerns.   Plan:   Patient Instructions  Plan:  Aim for 4 Carb Choices per meal (60 grams) +/- 1 either way   Aim for 0-2 Carbs per snack if hungry  Include protein in moderation with your meals and snacks Consider reading food labels for Total Carbohydrate of foods Consider  increasing your activity level by walking daily as tolerated Continuie checking BG at alternate times per day as tolerated Continue taking medication as directed by MD      Expected Outcomes:  Demonstrated interest in learning. Expect positive outcomes  Education material provided: Food label handouts, Meal plan card and Carbohydrate counting sheet, diabetes medication handout  If problems or questions, patient to contact team via:  Phone and Email  Future DSME appointment: PRN

## 2016-05-22 NOTE — Patient Instructions (Signed)
Plan:  Aim for 4 Carb Choices per meal (60 grams) +/- 1 either way  Aim for 0-2 Carbs per snack if hungry  Include protein in moderation with your meals and snacks Consider reading food labels for Total Carbohydrate of foods Consider  increasing your activity level by walking daily as tolerated Continuie checking BG at alternate times per day as tolerated Continue taking medication as directed by MD

## 2016-05-24 MED FILL — LISINOPRIL 20 MG TABLET: 20 | 90 days supply | Qty: 90 | Fill #0

## 2016-05-24 MED FILL — JARDIANCE 10 MG TABLET: 10 | 30 days supply | Qty: 30 | Fill #2

## 2016-05-24 MED FILL — PANTOPRAZOLE SOD DR 20 MG T: 20 | 90 days supply | Qty: 90 | Fill #0

## 2016-05-24 MED FILL — SIMVASTATIN 20 MG TABLET: 20 | 90 days supply | Qty: 90 | Fill #0

## 2016-05-26 DIAGNOSIS — G4733 Obstructive sleep apnea (adult) (pediatric): Secondary | ICD-10-CM | POA: Diagnosis not present

## 2016-05-29 MED FILL — MUPIROCIN 2% OINTMENT: 2 | 14 days supply | Qty: 22 | Fill #0

## 2016-05-31 MED FILL — JANUMET 50-1,000 MG TABLET: 50-1000 | 90 days supply | Qty: 180 | Fill #0

## 2016-06-02 IMAGING — CR DG CHEST 2V
2 series · 2 of 2 positions shown · non-contrast
Comparison: None.

CLINICAL DATA: Hypertension.

EXAM:
CHEST  2 VIEW

[w chest pa]
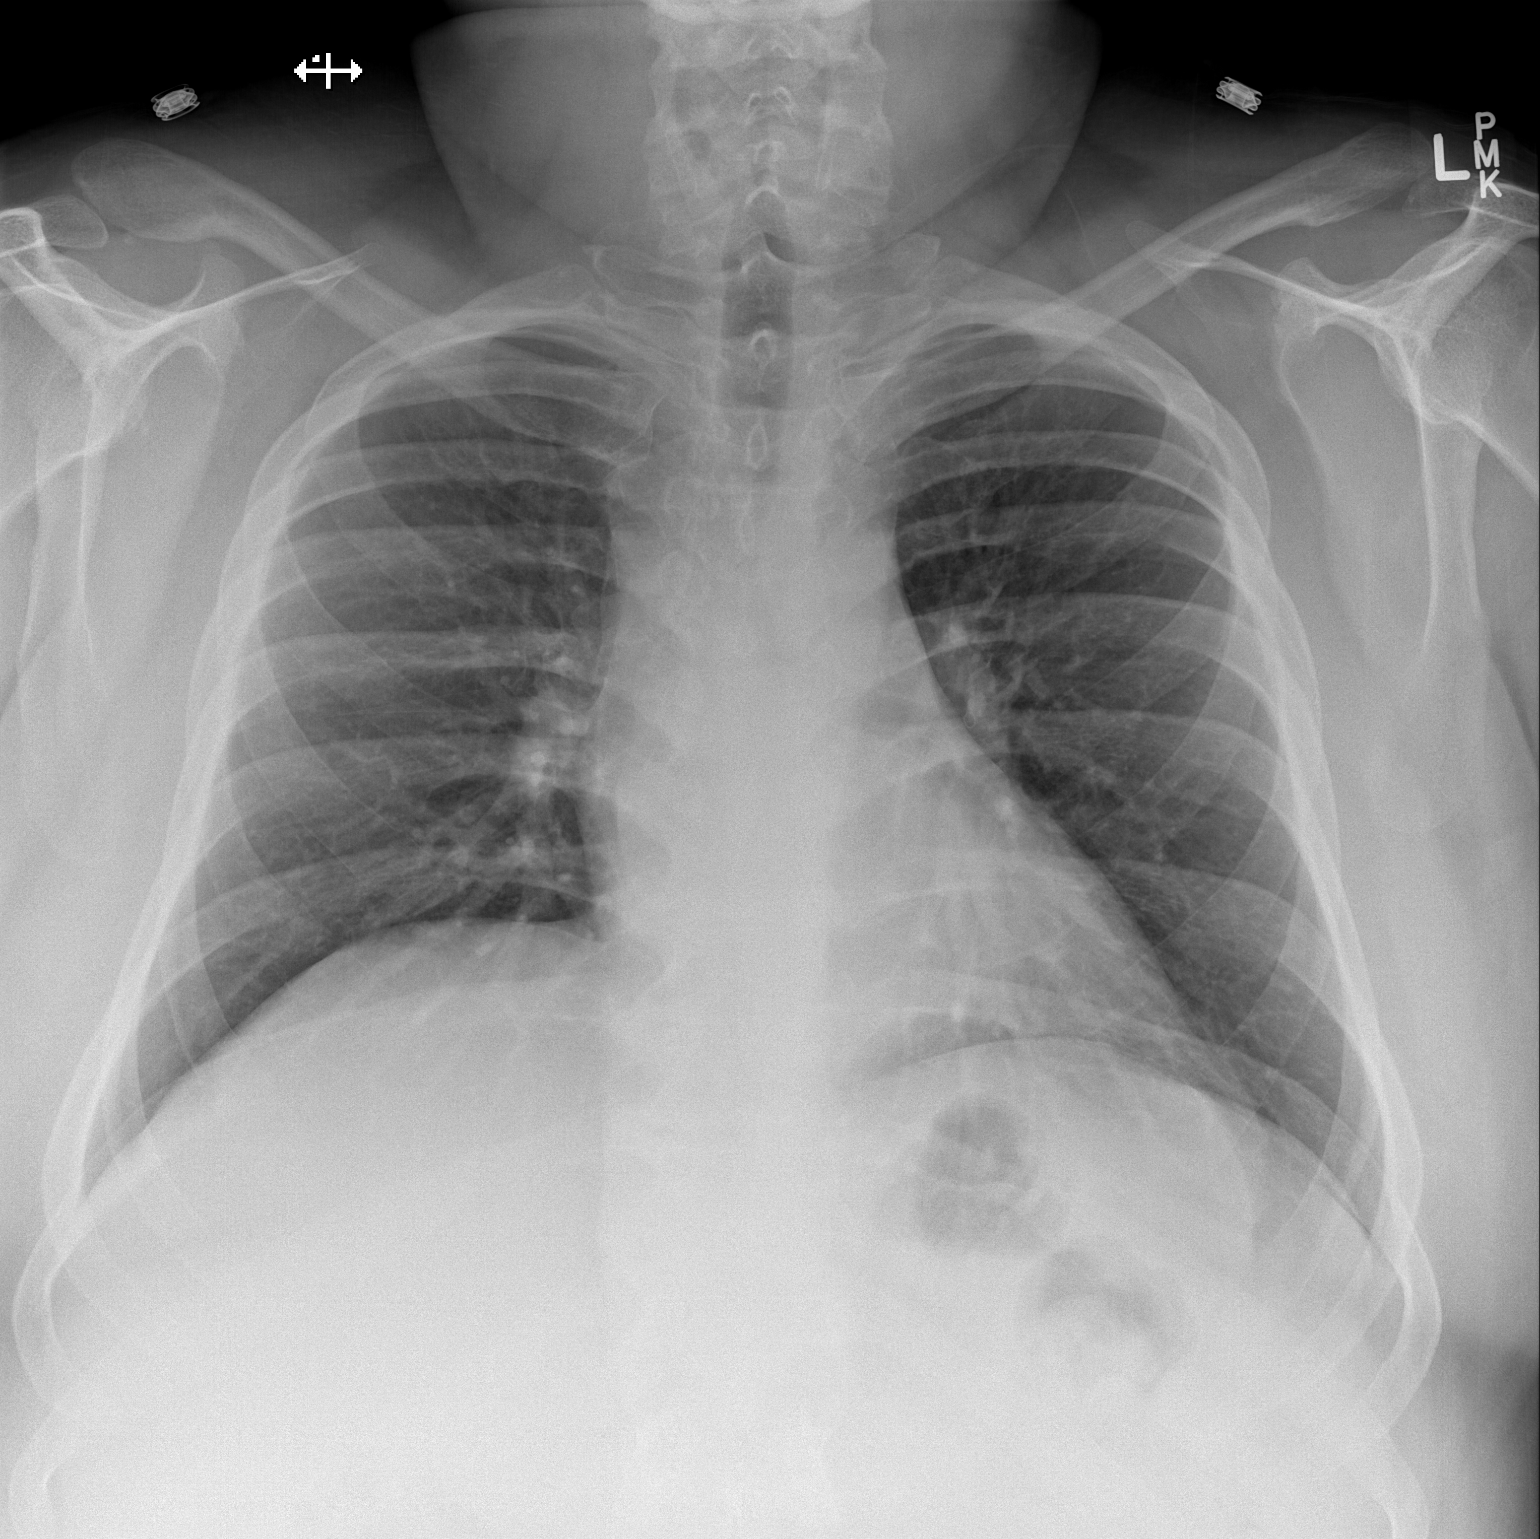

[w chest lat]
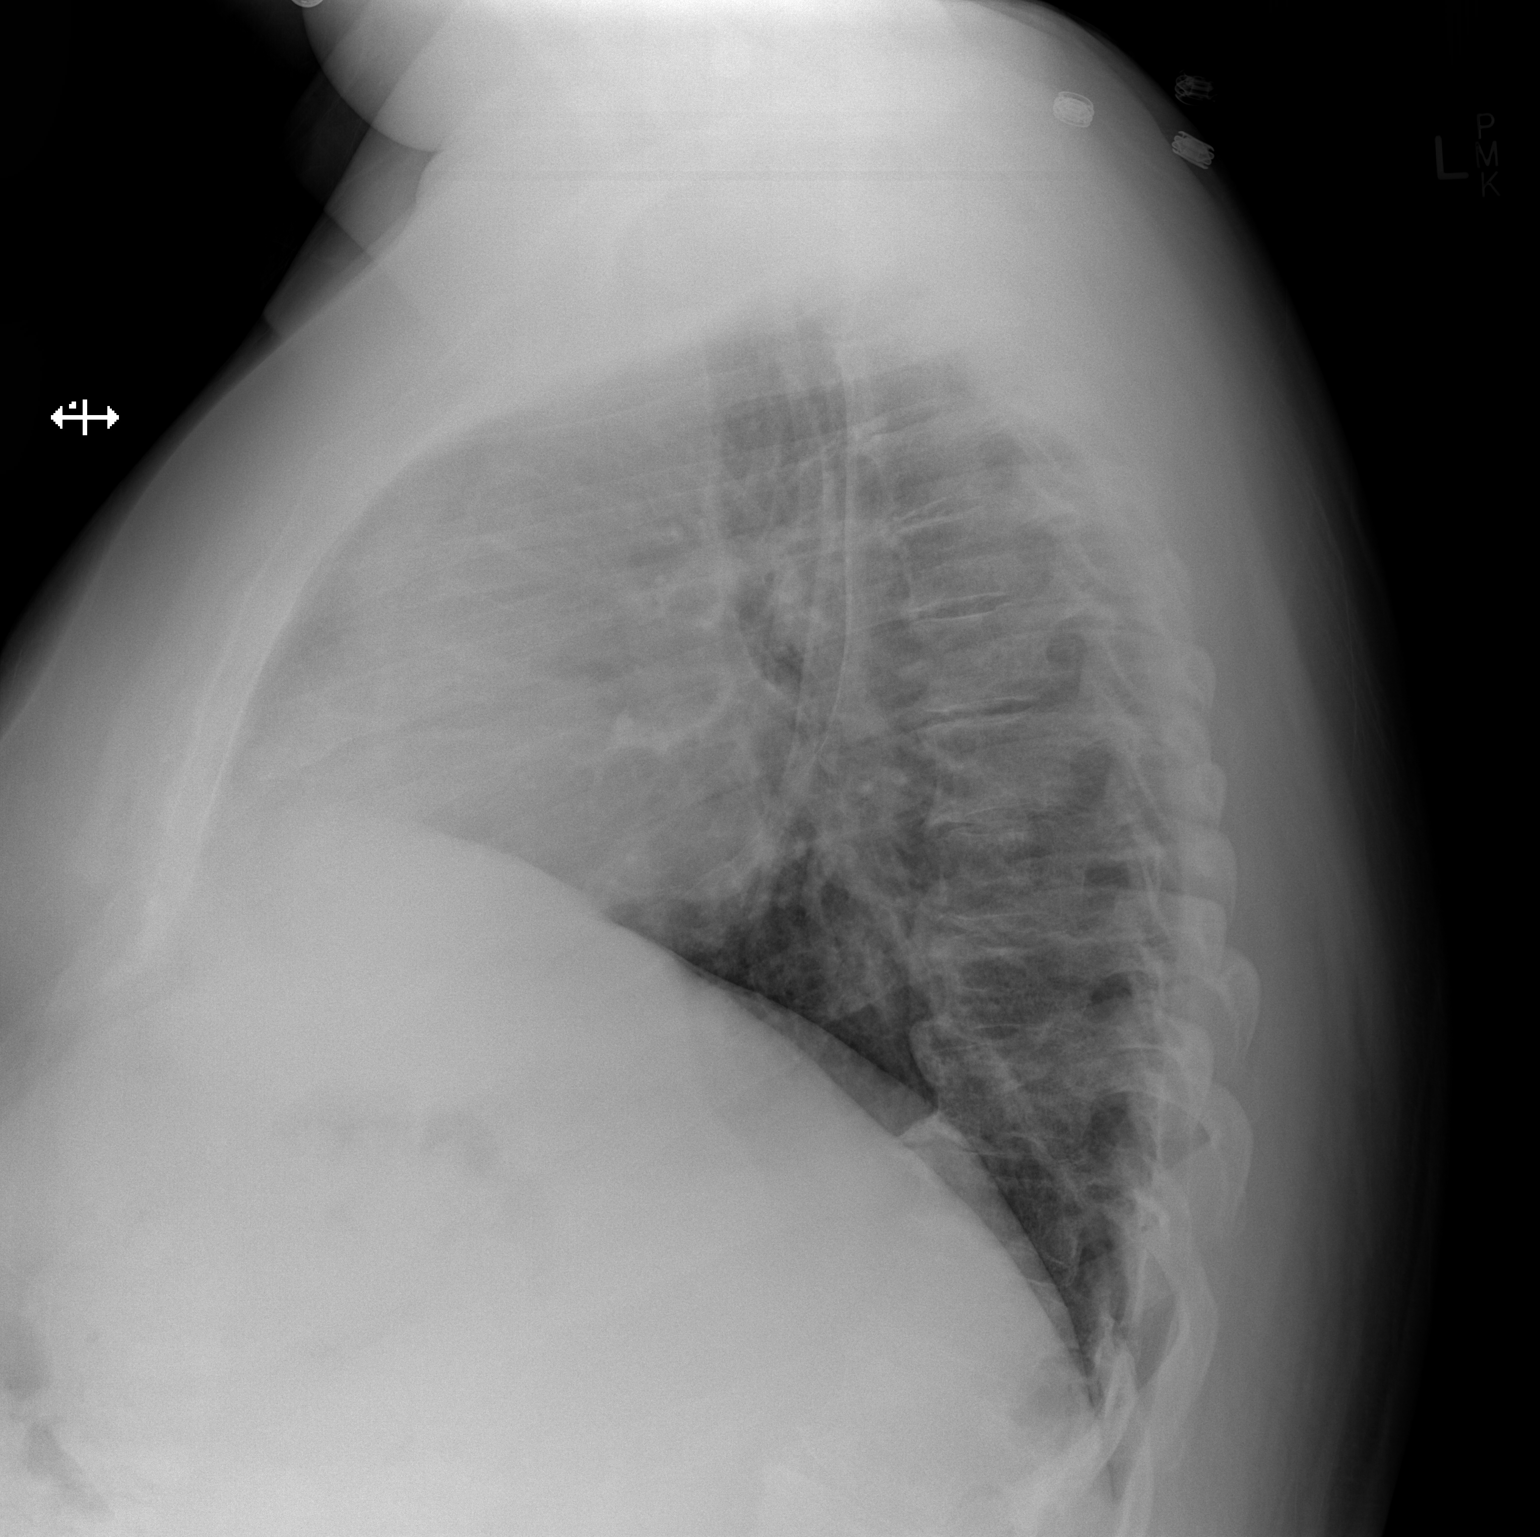

[2 of 2 positions shown; findings below may reference images not displayed]

FINDINGS: Heart size and mediastinal contours are within normal limits. Both
lungs are clear. Visualized skeletal structures are unremarkable.
IMPRESSION: Negative exam.

## 2016-06-10 ENCOUNTER — Ambulatory Visit (INDEPENDENT_AMBULATORY_CARE_PROVIDER_SITE_OTHER): Payer: 59 | Admitting: Adult Health

## 2016-06-10 ENCOUNTER — Encounter: Payer: Self-pay | Admitting: Adult Health

## 2016-06-10 ENCOUNTER — Other Ambulatory Visit: Payer: Self-pay | Admitting: Family Medicine

## 2016-06-10 DIAGNOSIS — G4733 Obstructive sleep apnea (adult) (pediatric): Secondary | ICD-10-CM | POA: Diagnosis not present

## 2016-06-10 MED FILL — BUPROPION HCL XL 300 MG TAB: 300 | 90 days supply | Qty: 90 | Fill #0

## 2016-06-10 NOTE — Assessment & Plan Note (Signed)
Well controlled on CPAP  Plan  Patient Instructions  Continue on CPAP At bedtime  .  Keep up good work .  May change to Nasal pillows -Dream Wear  Wear for at least 4-6hr each night .  Work on weight loss.  Do not drive if sleepy .  Follow up Dr. Larkin Ina or [place] in 4-6 months and As needed

## 2016-06-10 NOTE — Progress Notes (Signed)
@Patient  ID: Victor Estrada, male    DOB: [birth year], 46 y.o.   MRN: [id number]  Chief Complaint  Patient presents with  . Follow-up    OSA     Referring provider: ViaLennette Bihari, MD  HPI: 46 yo male followed for OSA   TEST  02/23/16 HST >AHI 43/hr   06/10/2016 of obstructive sleep apnea Patient presents for a three-month follow-up. Patient was seen for a sleep consult in October 2017 for daytime sleepiness. He was set up for home sleep study done on December 1 that showed severe sleep apnea with AHI of 43/h. Set up on C Pap machine at bedtime. He's been doing okay on Z-Pak. Except he has some issues with this mass. Patient did have sinus surgery and was not able to wear his C Pap machine for 6 weeks. Download over the last month shows last 2 weeks had excellent compliance with average usage at 4.75 hours. AHI 2.0. He is on AutoSet 5-15 cm H2O. He would like to Switch to nasal pillows. He does feel more rested. Feels that he is more alert since starting C Pap.  No Known Allergies  Immunization History  Administered Date(s) Administered  . Influenza Split 01/27/2012  . Influenza Whole 01/24/2011  . Influenza,inj,Quad PF,36+ Mos 01/23/2014, 10/25/2015  . Tdap 03/25/2006    Past Medical History:  Diagnosis Date  . Anxiety   . Complication of anesthesia   . Constipation   . Depression   . Diabetes mellitus   . GERD (gastroesophageal reflux disease)   . Head injury, acute, without loss of consciousness   . History of kidney stones   . Hyperlipidemia   . Hypertension   . Kidney stone   . PONV (postoperative nausea and vomiting)    post wisdom teeth  . Shortness of breath    with exertion  . Sinus disorder   . Sleep apnea     Tobacco History: History  Smoking Status  . Never Smoker  Smokeless Tobacco  . Never Used   Counseling given: Not Answered   Outpatient Encounter Prescriptions as of 06/10/2016  Medication Sig  . acetaminophen (TYLENOL) 500 MG tablet Take 1,000  mg by mouth every 6 (six) hours as needed (for pain).   Marland Kitchen aspirin EC 81 MG tablet Take 162 mg by mouth daily.  Marland Kitchen buPROPion (WELLBUTRIN XL) 300 MG 24 hr tablet Take 300 mg by mouth daily.  . empagliflozin (JARDIANCE) 10 MG TABS tablet Take 10 mg by mouth daily.  Marland Kitchen glimepiride (AMARYL) 4 MG tablet TAKE 1 TABLET (4 MG TOTAL) BY MOUTH 2 (TWO) TIMES DAILY.  Marland Kitchen lisinopril (PRINIVIL,ZESTRIL) 20 MG tablet TAKE 1 TABLET (20 MG TOTAL) BY MOUTH DAILY.  . Multiple Vitamin (MULTIVITAMIN) tablet Take 1 tablet by mouth daily.  . pantoprazole (PROTONIX) 20 MG tablet Take 1 tablet by mouth daily.  . simvastatin (ZOCOR) 20 MG tablet TAKE 1 TABLET BY MOUTH DAILY EVERY EVENING  . sitaGLIPtin-metformin (JANUMET) 50-1000 MG tablet TAKE 1 TABLET BY MOUTH 2 (TWO) TIMES DAILY WITH A MEAL.  Marland Kitchen TRINTELLIX 20 MG TABS Take 1 tablet by mouth daily.  . [DISCONTINUED] cephALEXin (KEFLEX) 500 MG capsule Take 1 capsule (500 mg total) by mouth 3 (three) times daily.  . [DISCONTINUED] HYDROcodone-acetaminophen (NORCO/VICODIN) 5-325 MG tablet Take 1-2 tablets by mouth every 6 (six) hours as needed for moderate pain.   No facility-administered encounter medications on file as of 06/10/2016.      Review of Systems  Constitutional:  No  weight loss, night sweats,  Fevers, chills, fatigue, or  lassitude.  HEENT:   No headaches,  Difficulty swallowing,  Tooth/dental problems, or  Sore throat,                No sneezing, itching, ear ache, nasal congestion, post nasal drip,   CV:  No chest pain,  Orthopnea, PND, swelling in lower extremities, anasarca, dizziness, palpitations, syncope.   GI  No heartburn, indigestion, abdominal pain, nausea, vomiting, diarrhea, change in bowel habits, loss of appetite, bloody stools.   Resp: No shortness of breath with exertion or at rest.  No excess mucus, no productive cough,  No non-productive cough,  No coughing up of blood.  No change in color of mucus.  No wheezing.  No chest wall  deformity  Skin: no rash or lesions.  GU: no dysuria, change in color of urine, no urgency or frequency.  No flank pain, no hematuria   MS:  No joint pain or swelling.  No decreased range of motion.  No back pain.    Physical Exam  BP 116/78 (BP Location: Right Arm, Cuff Size: Normal)   Pulse (!) 101   Ht 5\' 10"  (1.778 m)   Wt (!) 313 lb 3.2 oz (142.1 kg)   SpO2 98%   BMI 44.94 kg/m   GEN: A/Ox3; pleasant , NAD, obese    HEENT:  [place]/AT,  EACs-clear, TMs-wnl, NOSE-clear, THROAT-clear, no lesions, no postnasal drip or exudate noted. Class 2-3 MP airway   NECK:  Supple w/ fair ROM; no JVD; normal carotid impulses w/o bruits; no thyromegaly or nodules palpated; no lymphadenopathy.    RESP  Clear  P & A; w/o, wheezes/ rales/ or rhonchi. no accessory muscle use, no dullness to percussion  CARD:  RRR, no m/r/g, no peripheral edema, pulses intact, no cyanosis or clubbing.  GI:   Soft & nt; nml bowel sounds; no organomegaly or masses detected.   Musco: Warm bil, no deformities or joint swelling noted.   Neuro: alert, no focal deficits noted.    Skin: Warm, no lesions or rashes    Lab Results:   BNP No results found for: BNP  ProBNP No results found for: PROBNP  Imaging: No results found.   Assessment & Plan:   OSA (obstructive sleep apnea) Well controlled on CPAP  Plan  Patient Instructions  Continue on CPAP At bedtime  .  Keep up good work .  May change to Nasal pillows -Dream Wear  Wear for at least 4-6hr each night .  Work on weight loss.  Do not drive if sleepy .  Follow up Dr. Larkin Ina or Victor Estrada in 4-6 months and As needed      Morbid obesity Wt loss      Rexene Edison, NP 06/10/2016

## 2016-06-10 NOTE — Patient Instructions (Signed)
Continue on CPAP At bedtime  .  Keep up good work .  May change to Nasal pillows -Dream Wear  Wear for at least 4-6hr each night .  Work on weight loss.  Do not drive if sleepy .  Follow up Dr. Larkin Ina or Wynetta Seith in 4-6 months and As needed

## 2016-06-10 NOTE — Assessment & Plan Note (Signed)
Wt loss  

## 2016-06-25 ENCOUNTER — Encounter: Payer: Self-pay | Admitting: Adult Health

## 2016-06-25 ENCOUNTER — Ambulatory Visit (INDEPENDENT_AMBULATORY_CARE_PROVIDER_SITE_OTHER): Payer: 59 | Admitting: Adult Health

## 2016-06-25 DIAGNOSIS — G4733 Obstructive sleep apnea (adult) (pediatric): Secondary | ICD-10-CM | POA: Diagnosis not present

## 2016-06-25 NOTE — Patient Instructions (Addendum)
Continue on CPAP At bedtime  .  Keep up good work .  Wear for at least 4-6hr each night .  Work on weight loss.  Do not drive if sleepy .  Follow up in 3 months as planned and As needed

## 2016-06-25 NOTE — Progress Notes (Signed)
@Patient  ID: Victor Estrada, male    DOB: 1970/11/23, 46 y.o.   MRN: [id number]  CC: OSA   Referring provider: ViaLennette Bihari, MD  HPI: 46 yo male followed for OSA   TEST  02/2016 HST AHI 43/hr    06/25/2016 Follow up : OSA  Pt returns for a 1 month follow up for OSA . Recently started on CPAP for severe OSA . Says he is doing well now . Had sinus surgery in Feb , is feeling better. Now able to wear mask at night .  Wants order for nasal pillows. Download shows good compliance with avg usage ~5hr . AHI 1.9 . + leaks . On auto set 5-15cmH2o.  Feels rested with no sign daytime sleepiness.    No Known Allergies  Immunization History  Administered Date(s) Administered  . Influenza Split 01/27/2012  . Influenza Whole 01/24/2011  . Influenza,inj,Quad PF,36+ Mos 01/23/2014, 10/25/2015  . Tdap 03/25/2006    Past Medical History:  Diagnosis Date  . Anxiety   . Complication of anesthesia   . Constipation   . Depression   . Diabetes mellitus   . GERD (gastroesophageal reflux disease)   . Head injury, acute, without loss of consciousness   . History of kidney stones   . Hyperlipidemia   . Hypertension   . Kidney stone   . PONV (postoperative nausea and vomiting)    post wisdom teeth  . Shortness of breath    with exertion  . Sinus disorder   . Sleep apnea     Tobacco History: History  Smoking Status  . Never Smoker  Smokeless Tobacco  . Never Used   Counseling given: Not Answered   Outpatient Encounter Prescriptions as of 06/25/2016  Medication Sig  . acetaminophen (TYLENOL) 500 MG tablet Take 1,000 mg by mouth every 6 (six) hours as needed (for pain).   Marland Kitchen aspirin EC 81 MG tablet Take 162 mg by mouth daily.  Marland Kitchen buPROPion (WELLBUTRIN XL) 300 MG 24 hr tablet Take 300 mg by mouth daily.  . empagliflozin (JARDIANCE) 10 MG TABS tablet Take 10 mg by mouth daily.  Marland Kitchen lisinopril (PRINIVIL,ZESTRIL) 20 MG tablet TAKE 1 TABLET (20 MG TOTAL) BY MOUTH DAILY.  . Multiple Vitamin  (MULTIVITAMIN) tablet Take 1 tablet by mouth daily.  . pantoprazole (PROTONIX) 20 MG tablet Take 1 tablet by mouth daily.  . simvastatin (ZOCOR) 20 MG tablet TAKE 1 TABLET BY MOUTH DAILY EVERY EVENING  . sitaGLIPtin-metformin (JANUMET) 50-1000 MG tablet TAKE 1 TABLET BY MOUTH 2 (TWO) TIMES DAILY WITH A MEAL.  Marland Kitchen TRINTELLIX 20 MG TABS Take 1 tablet by mouth daily.  . [DISCONTINUED] glimepiride (AMARYL) 4 MG tablet TAKE 1 TABLET (4 MG TOTAL) BY MOUTH 2 (TWO) TIMES DAILY. (Patient not taking: Reported on 06/25/2016)   No facility-administered encounter medications on file as of 06/25/2016.      Review of Systems  Constitutional:   No  weight loss, night sweats,  Fevers, chills, fatigue, or  lassitude.  HEENT:   No headaches,  Difficulty swallowing,  Tooth/dental problems, or  Sore throat,                No sneezing, itching, ear ache,  +nasal congestion, post nasal drip,   CV:  No chest pain,  Orthopnea, PND, swelling in lower extremities, anasarca, dizziness, palpitations, syncope.   GI  No heartburn, indigestion, abdominal pain, nausea, vomiting, diarrhea, change in bowel habits, loss of appetite, bloody stools.   Resp:  No shortness of breath with exertion or at rest.  No excess mucus, no productive cough,  No non-productive cough,  No coughing up of blood.  No change in color of mucus.  No wheezing.  No chest wall deformity  Skin: no rash or lesions.  GU: no dysuria, change in color of urine, no urgency or frequency.  No flank pain, no hematuria   MS:  No joint pain or swelling.  No decreased range of motion.  No back pain.    Physical Exam  BP 116/74 (BP Location: Right Arm, Cuff Size: Normal)   Pulse (!) 102   Ht 5\' 10"  (1.778 m)   Wt (!) 309 lb 12.8 oz (140.5 kg)   SpO2 96%   BMI 44.45 kg/m   GEN: A/Ox3; pleasant , NAD, obese    HEENT:  [place]/AT,  EACs-clear, TMs-wnl, NOSE-clear, THROAT-clear, no lesions, no postnasal drip or exudate noted. Class 2-3 MP airway   NECK:   Supple w/ fair ROM; no JVD; normal carotid impulses w/o bruits; no thyromegaly or nodules palpated; no lymphadenopathy.    RESP  Clear  P & A; w/o, wheezes/ rales/ or rhonchi. no accessory muscle use, no dullness to percussion  CARD:  RRR, no m/r/g, no peripheral edema, pulses intact, no cyanosis or clubbing.  GI:   Soft & nt; nml bowel sounds; no organomegaly or masses detected.   Musco: Warm bil, no deformities or joint swelling noted.   Neuro: alert, no focal deficits noted.    Skin: Warm, no lesions or rashes    Lab Results:   BNP No results found for: BNP  ProBNP No results found for: PROBNP  Imaging: No results found.   Assessment & Plan:   No problem-specific Assessment & Plan notes found for this encounter.     Rexene Edison, NP 06/25/2016

## 2016-06-26 DIAGNOSIS — G4733 Obstructive sleep apnea (adult) (pediatric): Secondary | ICD-10-CM | POA: Diagnosis not present

## 2016-06-26 MED FILL — JARDIANCE 10 MG TABLET: 10 | 30 days supply | Qty: 30 | Fill #3

## 2016-06-28 ENCOUNTER — Telehealth: Payer: Self-pay | Admitting: Adult Health

## 2016-06-28 DIAGNOSIS — G4733 Obstructive sleep apnea (adult) (pediatric): Secondary | ICD-10-CM

## 2016-06-28 NOTE — Telephone Encounter (Signed)
Continue on CPAP At bedtime  .  Keep up good work .  May change to Nasal pillows -Dream Wear  Wear for at least 4-6hr each night .  Work on weight loss.  Do not drive if sleepy .  Follow up Dr. Larkin Ina or [place] in 4-6 months and As needed            Spoke with the pt and apologized that the order was never sent for his nasal pillows I sent urgent order to [facility]  Will forward to TP to make aware so she has sign off on order before the wkend

## 2016-07-03 DIAGNOSIS — G4733 Obstructive sleep apnea (adult) (pediatric): Secondary | ICD-10-CM | POA: Diagnosis not present

## 2016-07-08 NOTE — Assessment & Plan Note (Signed)
Great control on CPAP   Plan  Patient Instructions  Continue on CPAP At bedtime  .  Keep up good work .  Wear for at least 4-6hr each night .  Work on weight loss.  Do not drive if sleepy .  Follow up in 3 months as planned and As needed

## 2016-07-17 ENCOUNTER — Encounter: Payer: Self-pay | Admitting: Adult Health

## 2016-07-25 MED FILL — JARDIANCE 10 MG TABLET: 10 | 30 days supply | Qty: 30 | Fill #4

## 2016-07-25 MED FILL — PANTOPRAZOLE SOD DR 40 MG T: 40 | 30 days supply | Qty: 30 | Fill #2

## 2016-07-26 DIAGNOSIS — G4733 Obstructive sleep apnea (adult) (pediatric): Secondary | ICD-10-CM | POA: Diagnosis not present

## 2016-08-12 MED FILL — TRINTELLIX 20 MG TABLET: 20 | 90 days supply | Qty: 90 | Fill #1

## 2016-08-23 NOTE — Addendum Note (Signed)
Addendum  created 08/23/16 1026 by Rica Koyanagi, MD   Sign clinical note

## 2016-08-26 DIAGNOSIS — G4733 Obstructive sleep apnea (adult) (pediatric): Secondary | ICD-10-CM | POA: Diagnosis not present

## 2016-08-27 MED FILL — PANTOPRAZOLE SOD DR 20 MG T: 20 | 90 days supply | Qty: 90 | Fill #1

## 2016-08-27 MED FILL — JARDIANCE 10 MG TABLET: 10 | 30 days supply | Qty: 30 | Fill #5

## 2016-08-27 MED FILL — SIMVASTATIN 20 MG TABLET: 20 | 90 days supply | Qty: 90 | Fill #1

## 2016-09-10 MED FILL — BUPROPION HCL XL 300 MG TAB: 300 | 90 days supply | Qty: 90 | Fill #1

## 2016-09-10 MED FILL — LISINOPRIL 20 MG TABLET: 20 | 90 days supply | Qty: 90 | Fill #1

## 2016-09-20 MED FILL — JARDIANCE 10 MG TABLET: 10 | 30 days supply | Qty: 30 | Fill #6

## 2016-09-20 MED FILL — JANUMET 50-1,000 MG TABLET: 50-1000 | 90 days supply | Qty: 180 | Fill #1

## 2016-09-25 DIAGNOSIS — G4733 Obstructive sleep apnea (adult) (pediatric): Secondary | ICD-10-CM | POA: Diagnosis not present

## 2016-10-10 ENCOUNTER — Ambulatory Visit: Payer: 59 | Admitting: Adult Health

## 2016-10-31 MED FILL — JARDIANCE 10 MG TABLET: 10 | 30 days supply | Qty: 30 | Fill #0

## 2016-12-03 MED FILL — JARDIANCE 10 MG TABLET: 10 | 30 days supply | Qty: 30 | Fill #1

## 2016-12-10 DIAGNOSIS — F411 Generalized anxiety disorder: Secondary | ICD-10-CM | POA: Diagnosis not present

## 2016-12-10 DIAGNOSIS — F330 Major depressive disorder, recurrent, mild: Secondary | ICD-10-CM | POA: Diagnosis not present

## 2016-12-10 MED FILL — LISINOPRIL 20 MG TABS: 20 | 90 days supply | Qty: 90 | Fill #2

## 2016-12-10 MED FILL — BUPROPION HCL XL 300 MG TAB: 300 | 90 days supply | Qty: 90 | Fill #0

## 2016-12-10 MED FILL — TRINTELLIX 20 MG TABLET: 20 | 90 days supply | Qty: 90 | Fill #0

## 2016-12-30 MED FILL — JARDIANCE 10 MG TABLET: 10 | 30 days supply | Qty: 30 | Fill #2

## 2017-01-28 MED FILL — SIMVASTATIN 20 MG TABLET: 20 | 90 days supply | Qty: 90 | Fill #2

## 2017-01-28 MED FILL — JANUMET 50-1,000 MG TABLET: 50-1000 | 90 days supply | Qty: 180 | Fill #2

## 2017-01-28 MED FILL — PANTOPRAZOLE SOD DR 20 MG T: 20 | 90 days supply | Qty: 90 | Fill #2

## 2017-03-17 MED FILL — BUPROPION HCL XL 300 MG TAB: 300 | 90 days supply | Qty: 90 | Fill #1

## 2017-03-31 MED FILL — TRINTELLIX 20 MG TABLET: 20 | 90 days supply | Qty: 90 | Fill #1

## 2017-04-01 DIAGNOSIS — G4733 Obstructive sleep apnea (adult) (pediatric): Secondary | ICD-10-CM | POA: Diagnosis not present

## 2017-04-01 DIAGNOSIS — F329 Major depressive disorder, single episode, unspecified: Secondary | ICD-10-CM | POA: Diagnosis not present

## 2017-04-01 DIAGNOSIS — K219 Gastro-esophageal reflux disease without esophagitis: Secondary | ICD-10-CM | POA: Diagnosis not present

## 2017-04-01 DIAGNOSIS — E785 Hyperlipidemia, unspecified: Secondary | ICD-10-CM | POA: Diagnosis not present

## 2017-04-01 DIAGNOSIS — E782 Mixed hyperlipidemia: Secondary | ICD-10-CM | POA: Diagnosis not present

## 2017-04-01 DIAGNOSIS — E1165 Type 2 diabetes mellitus with hyperglycemia: Secondary | ICD-10-CM | POA: Diagnosis not present

## 2017-04-01 DIAGNOSIS — I10 Essential (primary) hypertension: Secondary | ICD-10-CM | POA: Diagnosis not present

## 2017-04-02 MED FILL — SIMVASTATIN 40 MG TABLET: 40 | 90 days supply | Qty: 90 | Fill #0

## 2017-04-02 MED FILL — LISINOPRIL 20 MG TABLET: 20 | 90 days supply | Qty: 90 | Fill #0

## 2017-04-02 MED FILL — JARDIANCE 10 MG TABLET: 10 | 90 days supply | Qty: 90 | Fill #0

## 2017-04-10 MED FILL — PANTOPRAZOLE SOD DR 20 MG T: 20 | 90 days supply | Qty: 90 | Fill #0

## 2017-04-10 MED FILL — JANUMET 50-1,000 MG TABLET: 50-1000 | 90 days supply | Qty: 180 | Fill #0

## 2017-05-16 ENCOUNTER — Telehealth: Payer: 59 | Admitting: Family

## 2017-05-16 DIAGNOSIS — J028 Acute pharyngitis due to other specified organisms: Secondary | ICD-10-CM

## 2017-05-16 DIAGNOSIS — B9689 Other specified bacterial agents as the cause of diseases classified elsewhere: Secondary | ICD-10-CM | POA: Diagnosis not present

## 2017-05-16 MED ORDER — AZITHROMYCIN 250 MG PO TABS: ORAL_TABLET | ORAL | 0 refills | Status: DC

## 2017-05-16 MED ORDER — PREDNISONE 5 MG PO TABS: 5.0000 mg | ORAL_TABLET | ORAL | 0 refills | Status: DC

## 2017-05-16 MED ORDER — BENZONATATE 100 MG PO CAPS: 100.0000 mg | ORAL_CAPSULE | Freq: Three times a day (TID) | ORAL | 0 refills | Status: DC | PRN

## 2017-05-16 MED FILL — BENZONATATE 100 MG CAPSULE: 100 | 5 days supply | Qty: 30 | Fill #0

## 2017-05-16 MED FILL — AZITHROMYCIN 250 MG TABLET: 250 | 5 days supply | Qty: 6 | Fill #0

## 2017-05-16 MED FILL — predniSONE 5 MG TABS: 5 | 6 days supply | Qty: 21 | Fill #0

## 2017-05-16 NOTE — Progress Notes (Signed)

## 2017-07-04 MED FILL — LISINOPRIL 20 MG TABLET: 20 | 90 days supply | Qty: 90 | Fill #1

## 2017-07-04 MED FILL — SIMVASTATIN 40 MG TABLET: 40 | 90 days supply | Qty: 90 | Fill #1

## 2017-07-04 MED FILL — JARDIANCE 10 MG TABLET: 10 | 90 days supply | Qty: 90 | Fill #1

## 2017-09-04 DIAGNOSIS — G4733 Obstructive sleep apnea (adult) (pediatric): Secondary | ICD-10-CM | POA: Diagnosis not present

## 2017-09-04 DIAGNOSIS — K219 Gastro-esophageal reflux disease without esophagitis: Secondary | ICD-10-CM | POA: Diagnosis not present

## 2017-09-04 DIAGNOSIS — F329 Major depressive disorder, single episode, unspecified: Secondary | ICD-10-CM | POA: Diagnosis not present

## 2017-09-04 DIAGNOSIS — Z0001 Encounter for general adult medical examination with abnormal findings: Secondary | ICD-10-CM | POA: Diagnosis not present

## 2017-09-04 DIAGNOSIS — Z7984 Long term (current) use of oral hypoglycemic drugs: Secondary | ICD-10-CM | POA: Diagnosis not present

## 2017-09-04 DIAGNOSIS — E1140 Type 2 diabetes mellitus with diabetic neuropathy, unspecified: Secondary | ICD-10-CM | POA: Diagnosis not present

## 2017-09-04 DIAGNOSIS — E785 Hyperlipidemia, unspecified: Secondary | ICD-10-CM | POA: Diagnosis not present

## 2017-09-04 DIAGNOSIS — I10 Essential (primary) hypertension: Secondary | ICD-10-CM | POA: Diagnosis not present

## 2017-09-24 MED FILL — JANUMET 50-1,000 MG TABLET: 50-1000 | 90 days supply | Qty: 180 | Fill #1

## 2017-09-29 MED FILL — JARDIANCE 10 MG TABLET: 10 | 90 days supply | Qty: 90 | Fill #0

## 2017-09-29 MED FILL — LISINOPRIL 20 MG TABLET: 20 | 90 days supply | Qty: 90 | Fill #0

## 2017-11-10 MED FILL — PANTOPRAZOLE SOD DR 20 MG T: 20 | 90 days supply | Qty: 90 | Fill #1

## 2017-12-04 MED FILL — GABAPENTIN 300 MG CAPSULE: 300 | 30 days supply | Qty: 60 | Fill #0

## 2017-12-29 MED FILL — JARDIANCE 10 MG TABLET: 10 | 90 days supply | Qty: 90 | Fill #1

## 2017-12-29 MED FILL — LISINOPRIL 20 MG TABLET: 20 | 90 days supply | Qty: 90 | Fill #1

## 2017-12-29 MED FILL — GABAPENTIN 300 MG CAPSULE: 300 | 30 days supply | Qty: 60 | Fill #1

## 2017-12-29 MED FILL — JANUMET 50-1,000 MG TABLET: 50-1000 | 30 days supply | Qty: 60 | Fill #0

## 2018-02-04 MED FILL — GABAPENTIN 400 MG CAPS: 400 | 30 days supply | Qty: 60 | Fill #0

## 2018-02-04 MED FILL — JANUMET 50-1,000 MG TABLET: 50-1000 | 30 days supply | Qty: 60 | Fill #1

## 2018-02-12 MED FILL — PANTOPRAZOLE SOD DR 20 MG T: 20 | 90 days supply | Qty: 90 | Fill #0

## 2018-03-01 ENCOUNTER — Telehealth: Payer: 59 | Admitting: Family

## 2018-03-01 DIAGNOSIS — J028 Acute pharyngitis due to other specified organisms: Secondary | ICD-10-CM

## 2018-03-01 DIAGNOSIS — B9689 Other specified bacterial agents as the cause of diseases classified elsewhere: Secondary | ICD-10-CM

## 2018-03-01 MED ORDER — BENZONATATE 100 MG PO CAPS: 100.0000 mg | ORAL_CAPSULE | Freq: Three times a day (TID) | ORAL | 0 refills | Status: DC | PRN

## 2018-03-01 MED ORDER — AZITHROMYCIN 250 MG PO TABS: ORAL_TABLET | ORAL | 0 refills | Status: DC

## 2018-03-01 MED ORDER — PREDNISONE 5 MG PO TABS: 5.0000 mg | ORAL_TABLET | ORAL | 0 refills | Status: DC

## 2018-03-01 NOTE — Progress Notes (Signed)
Thank you for the details you included in the comment boxes. Those details are very helpful in determining the best course of treatment for you and help [place] to provide the best care. Based on that extra information, I am providing the same treatment plan. See below.  We are sorry that you are not feeling well.  Here is how we plan to help!  Based on your presentation I believe you most likely have A cough due to bacteria.  When patients have a fever and a productive cough with a change in color or increased sputum production, we are concerned about bacterial bronchitis.  If left untreated it can progress to pneumonia.  If your symptoms do not improve with your treatment plan it is important that you contact your provider.   I have prescribed Azithromyin 250 mg: two tablets now and then one tablet daily for 4 additonal days    In addition you may use A non-prescription cough medication called Mucinex DM: take 2 tablets every 12 hours. and A prescription cough medication called Tessalon Perles 100mg . You may take 1-2 capsules every 8 hours as needed for your cough.  Prednisone 5 mg daily for 6 days (see taper instructions below)  Directions for 6 day taper: Day 1: 2 tablets before breakfast, 1 after both lunch & dinner and 2 at bedtime Day 2: 1 tab before breakfast, 1 after both lunch & dinner and 2 at bedtime Day 3: 1 tab at each meal & 1 at bedtime Day 4: 1 tab at breakfast, 1 at lunch, 1 at bedtime Day 5: 1 tab at breakfast & 1 tab at bedtime Day 6: 1 tab at breakfast   From your responses in the eVisit questionnaire you describe inflammation in the upper respiratory tract which is causing a significant cough.  This is commonly called Bronchitis and has four common causes:    Allergies  Viral Infections  Acid Reflux  Bacterial Infection Allergies, viruses and acid reflux are treated by controlling symptoms or eliminating the cause. An example might be a cough caused by taking certain  blood pressure medications. You stop the cough by changing the medication. Another example might be a cough caused by acid reflux. Controlling the reflux helps control the cough.  USE OF BRONCHODILATOR ("RESCUE") INHALERS: There is a risk from using your bronchodilator too frequently.  The risk is that over-reliance on a medication which only relaxes the muscles surrounding the breathing tubes can reduce the effectiveness of medications prescribed to reduce swelling and congestion of the tubes themselves.  Although you feel brief relief from the bronchodilator inhaler, your asthma may actually be worsening with the tubes becoming more swollen and filled with mucus.  This can delay other crucial treatments, such as oral steroid medications. If you need to use a bronchodilator inhaler daily, several times per day, you should discuss this with your provider.  There are probably better treatments that could be used to keep your asthma under control.     HOME CARE . Only take medications as instructed by your medical team. . Complete the entire course of an antibiotic. . Drink plenty of fluids and get plenty of rest. . Avoid close contacts especially the very young and the elderly . Cover your mouth if you cough or cough into your sleeve. . Always remember to wash your hands . A steam or ultrasonic humidifier can help congestion.   GET HELP RIGHT AWAY IF: . You develop worsening fever. . You become short  of breath . You cough up blood. . Your symptoms persist after you have completed your treatment plan MAKE SURE YOU   Understand these instructions.  Will watch your condition.  Will get help right away if you are not doing well or get worse.  Your e-visit answers were reviewed by a board certified advanced clinical practitioner to complete your personal care plan.  Depending on the condition, your plan could have included both over the counter or prescription medications. If there is a problem  please reply  once you have received a response from your provider. Your safety is important to [place].  If you have drug allergies check your prescription carefully.    You can use MyChart to ask questions about today's visit, request a non-urgent call back, or ask for a work or school excuse for 24 hours related to this e-Visit. If it has been greater than 24 hours you will need to follow up with your provider, or enter a new e-Visit to address those concerns. You will get an e-mail in the next two days asking about your experience.  I hope that your e-visit has been valuable and will speed your recovery. Thank you for using e-visits.

## 2018-03-09 MED FILL — GABAPENTIN 400 MG CAPS: 400 | 30 days supply | Qty: 60 | Fill #1

## 2018-03-10 MED FILL — JANUMET 50-1,000 MG TABLET: 50-1000 | 30 days supply | Qty: 60 | Fill #2

## 2018-04-02 MED FILL — GABAPENTIN 400 MG CAPS: 400 | 90 days supply | Qty: 180 | Fill #0

## 2018-04-02 MED FILL — JARDIANCE 10 MG TABLET: 10 | 90 days supply | Qty: 90 | Fill #0

## 2018-04-14 MED FILL — LISINOPRIL 20 MG TABLET: 20 | 90 days supply | Qty: 90 | Fill #0

## 2018-04-14 MED FILL — JANUMET 50-1,000 MG TABLET: 50-1000 | 30 days supply | Qty: 60 | Fill #0

## 2018-05-19 DIAGNOSIS — E782 Mixed hyperlipidemia: Secondary | ICD-10-CM | POA: Diagnosis not present

## 2018-05-19 DIAGNOSIS — G4733 Obstructive sleep apnea (adult) (pediatric): Secondary | ICD-10-CM | POA: Diagnosis not present

## 2018-05-19 DIAGNOSIS — E7800 Pure hypercholesterolemia, unspecified: Secondary | ICD-10-CM | POA: Diagnosis not present

## 2018-05-19 DIAGNOSIS — L309 Dermatitis, unspecified: Secondary | ICD-10-CM | POA: Diagnosis not present

## 2018-05-19 DIAGNOSIS — K219 Gastro-esophageal reflux disease without esophagitis: Secondary | ICD-10-CM | POA: Diagnosis not present

## 2018-05-19 DIAGNOSIS — I10 Essential (primary) hypertension: Secondary | ICD-10-CM | POA: Diagnosis not present

## 2018-05-19 DIAGNOSIS — E1169 Type 2 diabetes mellitus with other specified complication: Secondary | ICD-10-CM | POA: Diagnosis not present

## 2018-05-19 DIAGNOSIS — G629 Polyneuropathy, unspecified: Secondary | ICD-10-CM | POA: Diagnosis not present

## 2018-05-19 MED FILL — SIMVASTATIN 40 MG TABLET: 40 | 90 days supply | Qty: 90 | Fill #0

## 2018-05-19 MED FILL — JANUMET 50-1,000 MG TABLET: 50-1000 | 90 days supply | Qty: 180 | Fill #0

## 2018-05-19 MED FILL — PANTOPRAZOLE SOD DR 20 MG T: 20 | 90 days supply | Qty: 90 | Fill #0

## 2018-05-20 MED FILL — VASCEPA 1 GM CAPSULE: 1 | 30 days supply | Qty: 120 | Fill #0

## 2018-05-21 MED FILL — GABAPENTIN 600 MG TABS: 600 | 90 days supply | Qty: 180 | Fill #0

## 2018-06-26 ENCOUNTER — Ambulatory Visit: Payer: 59 | Admitting: Internal Medicine

## 2018-06-26 ENCOUNTER — Other Ambulatory Visit: Payer: Self-pay

## 2018-06-26 ENCOUNTER — Encounter: Payer: Self-pay | Admitting: Internal Medicine

## 2018-06-26 VITALS — BP 120/82 | HR 106 | Temp 97.7°F | Wt 294.0 lb

## 2018-06-26 DIAGNOSIS — E1165 Type 2 diabetes mellitus with hyperglycemia: Secondary | ICD-10-CM

## 2018-06-26 DIAGNOSIS — E1142 Type 2 diabetes mellitus with diabetic polyneuropathy: Secondary | ICD-10-CM | POA: Diagnosis not present

## 2018-06-26 MED ORDER — DULAGLUTIDE 0.75 MG/0.5ML ~~LOC~~ SOAJ: SUBCUTANEOUS | 1 refills | Status: DC

## 2018-06-26 MED ORDER — METFORMIN HCL 1000 MG PO TABS: 1000.0000 mg | ORAL_TABLET | Freq: Two times a day (BID) | ORAL | 3 refills | Status: DC

## 2018-06-26 MED FILL — VASCEPA 1 GM CAPSULE: 1 | 30 days supply | Qty: 120 | Fill #1

## 2018-06-26 MED FILL — metFORMIN HCL 1000 MG TABS: 1000 | 90 days supply | Qty: 180 | Fill #0

## 2018-06-26 NOTE — Patient Instructions (Addendum)
Please continue: - Jardiance 10 mg before b'fast  Please stop JanuMet and start: - Metformin 1000 mg 2x a day with meals - Trulicity 2.99 mg weekly. Let me know when you are close to running out to call in the higher dose to your pharmacy (1.5 mg).  Please let me know if the sugars are consistently <80 or >200.  Please return in 3 months with your sugar log.   PATIENT INSTRUCTIONS FOR TYPE 2 DIABETES:  **Please join MyChart!** - see attached instructions about how to join if you have not done so already.  DIET AND EXERCISE Diet and exercise is an important part of diabetic treatment.  We recommended aerobic exercise in the form of brisk walking (working between 40-60% of maximal aerobic capacity, similar to brisk walking) for 150 minutes per week (such as 30 minutes five days per week) along with 3 times per week performing 'resistance' training (using various gauge rubber tubes with handles) 5-10 exercises involving the major muscle groups (upper body, lower body and core) performing 10-15 repetitions (or near fatigue) each exercise. Start at half the above goal but build slowly to reach the above goals. If limited by weight, joint pain, or disability, we recommend daily walking in a swimming pool with water up to waist to reduce pressure from joints while allow for adequate exercise.    BLOOD GLUCOSES Monitoring your blood glucoses is important for continued management of your diabetes. Please check your blood glucoses 2-4 times a day: fasting, before meals and at bedtime (you can rotate these measurements - e.g. one day check before the 3 meals, the next day check before 2 of the meals and before bedtime, etc.).   HYPOGLYCEMIA (low blood sugar) Hypoglycemia is usually a reaction to not eating, exercising, or taking too much insulin/ other diabetes drugs.  Symptoms include tremors, sweating, hunger, confusion, headache, etc. Treat IMMEDIATELY with 15 grams of Carbs: . 4 glucose  tablets .  cup regular juice/soda . 2 tablespoons raisins . 4 teaspoons sugar . 1 tablespoon honey Recheck blood glucose in 15 mins and repeat above if still symptomatic/blood glucose <100.  RECOMMENDATIONS TO REDUCE YOUR RISK OF DIABETIC COMPLICATIONS: * Take your prescribed MEDICATION(S) * Follow a DIABETIC diet: Complex carbs, fiber rich foods, (monounsaturated and polyunsaturated) fats * AVOID saturated/trans fats, high fat foods, >2,300 mg salt per day. * EXERCISE at least 5 times a week for 30 minutes or preferably daily.  * DO NOT SMOKE OR DRINK more than 1 drink a day. * Check your FEET every day. Do not wear tightfitting shoes. Contact us if you develop an ulcer * See your EYE doctor once a year or more if needed * Get a FLU shot once a year * Get a PNEUMONIA vaccine once before and once after age 11 years  GOALS:  * Your Hemoglobin A1c of <7%  * fasting sugars need to be <130 * after meals sugars need to be <180 (2h after you start eating) * Your Systolic BP should be 242 or lower  * Your Diastolic BP should be 80 or lower  * Your HDL (Good Cholesterol) should be 40 or higher  * Your LDL (Bad Cholesterol) should be 100 or lower. * Your Triglycerides should be 150 or lower  * Your Urine microalbumin (kidney function) should be <30 * Your Body Mass Index should be 25 or lower    Please consider the following ways to cut down carbs and fat and increase fiber and micronutrients  in your diet: - substitute whole grain for white bread or pasta - substitute brown rice for white rice - substitute 90-calorie flat bread pieces for slices of bread when possible - substitute sweet potatoes or yams for white potatoes - substitute humus for margarine - substitute tofu for cheese when possible - substitute almond or rice milk for regular milk (would not drink soy milk daily due to concern for soy estrogen influence on breast cancer risk) - substitute dark chocolate for other sweets  when possible - substitute water - can add lemon or orange slices for taste - for diet sodas (artificial sweeteners will trick your body that you can eat sweets without getting calories and will lead you to overeating and weight gain in the long run) - do not skip breakfast or other meals (this will slow down the metabolism and will result in more weight gain over time)  - can try smoothies made from fruit and almond/rice milk in am instead of regular breakfast - can also try old-fashioned (not instant) oatmeal made with almond/rice milk in am - order the dressing on the side when eating salad at a restaurant (pour less than half of the dressing on the salad) - eat as little meat as possible - can try juicing, but should not forget that juicing will get rid of the fiber, so would alternate with eating raw veg./fruits or drinking smoothies - use as little oil as possible, even when using olive oil - can dress a salad with a mix of balsamic vinegar and lemon juice, for e.g. - use agave nectar, stevia sugar, or regular sugar rather than artificial sweateners - steam or broil/roast veggies  - snack on veggies/fruit/nuts (unsalted, preferably) when possible, rather than processed foods - reduce or eliminate aspartame in diet (it is in diet sodas, chewing gum, etc) Read the labels!  Try to read Dr. Janene Harvey book: "Program for Reversing Diabetes" for other ideas for healthy eating.

## 2018-06-26 NOTE — Progress Notes (Signed)
Patient ID: Victor Estrada, male   DOB: 12/02/1970, 48 y.o.   MRN: [id number]   HPI: Victor Estrada is a 48 y.o.-year-old male, referred by his previous PCP, Dr. Moreen Fowler, for management of DM2, dx in 2005, non-insulin-dependent, uncontrolled, with long-term complications (PN). His wife is also my pt: Victor Estrada. He saw Dr. Loanne Drilling in 2013.  Reviewed latest HbA1c level: 05/19/2018: HbA1c 11% 04/01/2017: HbA1c 11.2% Lab Results  Component Value Date   HGBA1C 11.1 (H) 11/03/2015   HGBA1C 10.5 (H) 02/06/2015   HGBA1C 6.6 (H) 09/14/2013   HGBA1C 6.8 (H) 05/28/2013   HGBA1C 10.5 (H) 01/27/2012   HGBA1C 9.9 (H) 10/01/2011   HGBA1C 9.9 (H) 05/23/2011   Pt is on a regimen of: - Janumet 50-1000 mg 2x a day, with meals - Jardiance 10 mg before breakfast He tried Glimepiride in the past.  Pt is not checking sugars. - am: 306 - at last clinic visit with PCP - 2h after b'fast: n/c - before lunch: n/c - 2h after lunch: n/c - before dinner: n/c - 2h after dinner: n/c - bedtime: n/c - nighttime: n/c Lowest sugar was 90 - not recently; he has hypoglycemia awareness at 90.  Highest sugar was 306.  Glucometer: One Touch ultra >> Freestyle  Pt's meals are: - Breakfast: skip - Lunch: lean cuisine, fast food - Dinner: same - Snacks: protein bars, PB crackers, fruit No more regular sodas in last month.  -+ mild CKD, last BUN/creatinine:  05/19/2018: 24/1.07, GFR 74, glucose 306, otherwise normal CMP Lab Results  Component Value Date   BUN 19 04/23/2016   BUN 15 11/03/2015   CREATININE 1.29 (H) 04/23/2016   CREATININE 0.99 11/03/2015  On lisinopril 20 mg daily.  - +HL; last set of lipids: 05/19/2018: 368/1181/33/not calculated Lab Results  Component Value Date   CHOL 151 11/03/2015   HDL 37 (L) 11/03/2015   LDLCALC 48 11/03/2015   LDLDIRECT 92.0 02/06/2015   TRIG 330 (H) 11/03/2015   CHOLHDL 4.1 11/03/2015  On Zocor 40, Vascepa-added 04/2018  - last eye exam was in ~2018. No DR.    -+ Numbness and tingling in his feet.  On Neurontin 600 mg twice a day.  Pt has FH of DM in father.  She also has a history of HTN, GERD, OSA, obesity class III, kidney stones.  He has a family history of bile duct cancer in mother.  ROS: Constitutional: no weight gain, no weight loss, + fatigue, no subjective hyperthermia, no subjective hypothermia, no nocturia, + poor sleep Eyes: no blurry vision, no xerophthalmia ENT: no sore throat, no nodules palpated in neck, no dysphagia, no odynophagia, no hoarseness, no tinnitus, no hypoacusis Cardiovascular: no CP, no SOB, no palpitations, + leg swelling Respiratory: no cough, no SOB, no wheezing Gastrointestinal: no N, no V, no D, no C, + acid reflux Musculoskeletal: no muscle, no joint aches Skin: no rash, no hair loss Neurological: no tremors, no numbness or tingling/no dizziness/no HAs Psychiatric:+ depression, no anxiety + difficulty with erections  Past Medical History:  Diagnosis Date  . Anxiety   . Complication of anesthesia   . Constipation   . Depression   . Diabetes mellitus   . GERD (gastroesophageal reflux disease)   . Head injury, acute, without loss of consciousness   . History of kidney stones   . Hyperlipidemia   . Hypertension   . Kidney stone   . PONV (postoperative nausea and vomiting)    post wisdom teeth  .  Shortness of breath    with exertion  . Sinus disorder   . Sleep apnea    Past Surgical History:  Procedure Laterality Date  . DENTAL SURGERY    . INCISION AND DRAINAGE ABSCESS Left 11/12/2013   Procedure: INCISION AND DRAINAGE LEFT CHEEK  ABSCESS;  Surgeon: Melida Quitter, MD;  Location: [facility];  Service: ENT;  Laterality: Left;  . NASAL SEPTOPLASTY W/ TURBINOPLASTY  04/26/2016  . NASAL SEPTOPLASTY W/ TURBINOPLASTY N/A 04/26/2016   Procedure: NASAL SEPTOPLASTY WITH TURBINATE REDUCTION;  Surgeon: Melida Quitter, MD;  Location: [facility];  Service: ENT;  Laterality: N/A;  . WISDOM TOOTH EXTRACTION      Social History   Socioeconomic History  . Marital status: Married  Occupational History  Social Needs  . Financial resource strain: Not on file  . Food insecurity:    Worry: Not on file    Inability: Not on file  . Transportation needs:    Medical: Not on file    Non-medical: Not on file  Tobacco Use  . Smoking status: Never Smoker  . Smokeless tobacco: Never Used  Substance and Sexual Activity  . Alcohol use: No  . Drug use: No  . Sexual activity: Not on file  Lifestyle  . Physical activity:    Days per week: Not on file    Minutes per session: Not on file  . Stress: Not on file  Relationships  . Social connections:    Talks on phone: Not on file    Gets together: Not on file    Attends religious service: Not on file    Active member of club or organization: Not on file    Attends meetings of clubs or organizations: Not on file    Relationship status: Not on file  . Intimate partner violence:    Fear of current or ex partner: Not on file    Emotionally abused: Not on file    Physically abused: Not on file    Forced sexual activity: Not on file  Other Topics Concern  . 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 empagliflozin (JARDIANCE) 10 MG TABS tablet Take 10 mg by mouth daily. 30 tablet 6  . lisinopril (PRINIVIL,ZESTRIL) 20 MG tablet TAKE 1 TABLET (20 MG TOTAL) BY MOUTH DAILY. 90 tablet 0  . Multiple Vitamin (MULTIVITAMIN) tablet Take 1 tablet by mouth daily.    . pantoprazole (PROTONIX) 20 MG tablet Take 1 tablet by mouth daily.  3  . simvastatin (ZOCOR) 20 MG tablet TAKE 1 TABLET BY MOUTH DAILY EVERY EVENING 30 tablet 6  . sitaGLIPtin-metformin (JANUMET) 50-1000 MG tablet TAKE 1 TABLET BY MOUTH 2 (TWO) TIMES DAILY WITH A MEAL. 60 tablet 6  . acetaminophen (TYLENOL) 500 MG tablet Take 1,000 mg by mouth every  6 (six) hours as needed (for pain).     Marland Kitchen aspirin EC 81 MG tablet Take 162 mg by mouth daily.    Marland Kitchen buPROPion (WELLBUTRIN XL) 300 MG 24 hr tablet Take 300 mg by mouth daily.    . fexofenadine (ALLEGRA) 180 MG tablet Take by mouth.    . gabapentin (NEURONTIN) 400 MG capsule     . TRINTELLIX 20 MG TABS Take 1 tablet by mouth daily.  0  . VASCEPA 1 g CAPS  No current facility-administered medications on file prior to visit.    No Known Allergies Family History  Problem Relation Age of Onset  . Hyperlipidemia Father   . Heart disease Father   . Diabetes Father   . Heart disease Maternal Grandmother   . Diabetes Paternal Grandmother   . Cancer Mother     PE: BP 120/82 (BP Location: Right Arm, Patient Position: Sitting, Cuff Size: Large)   Pulse (!) 106   Temp 97.7 F (36.5 C) (Oral)   Wt 294 lb (133.4 kg)   SpO2 97%   BMI 42.18 kg/m  Wt Readings from Last 3 Encounters:  06/26/18 294 lb (133.4 kg)  06/25/16 (!) 309 lb 12.8 oz (140.5 kg)  06/10/16 (!) 313 lb 3.2 oz (142.1 kg)   Constitutional: overweight, in NAD Eyes: PERRLA, EOMI, no exophthalmos ENT: moist mucous membranes, no thyromegaly, no cervical lymphadenopathy Cardiovascular: Tachycardia RR, No MRG Respiratory: CTA B Gastrointestinal: abdomen soft, NT, ND, BS+ Musculoskeletal: no deformities, strength intact in all 4 Skin: moist, warm, no rashes Neurological: no tremor with outstretched hands, DTR normal in all 4  ASSESSMENT: 1. DM2, non-insulin-dependent, uncontrolled, with long-term complications -PN -Mild CKD  PLAN:  1. Patient with long-standing, uncontrolled diabetes, on oral antidiabetic regimen, which became insufficient.  His latest HbA1c was 11%, stable since 2016.  However, in 2015, when he improved his diet, his HbA1c levels were lower than 7.  He is convinced that he can start again work on his diet and the timing of his eating to improve his sugars.  He is also interested in healthier meals and we  discussed about these.  I made specific suggestions of reducing fatty foods in his diet to improve his insulin resistance.  We discussed about this concept and how this can help improving his diabetes.  He is reticent to add insulin for now as he would like to try dietary changes first.  In this case, we discussed about switching from Januvia to a GLP-1 receptor agonist.  He does not have a history of pancreatitis but he has high triglycerides which, I explained, will put him at risk for pancreatitis.  He was recently started on Vascepa which should help with lowering the triglycerides.  We did discuss about the fact that sometimes high level of triglycerides can indicate insulin deficiency, but for now, we decided to wait several weeks to see how his GLP-1 receptor agonist will influence the sugars and, if the improvement is not significant, he will need to start basal insulin.  He agrees with the plan.  His wife takes Trulicity and tolerates this well and he knows that this is covered by his insurance.  We will stop his Janumet, switch to metformin only and continue his Jardiance.  At next visit, we can increase the Jardiance. -He refuses a referral to nutrition for now - I suggested to:  Patient Instructions  Please continue: - Jardiance 10 mg before b'fast  Please stop JanuMet and start: - Metformin 1000 mg 2x a day with meals - Trulicity 1.01 mg weekly. Let me know when you are close to running out to call in the higher dose to your pharmacy (1.5 mg).  Please let me know if the sugars are consistently <80 or >200.  Please return in 3 months with your sugar log.   - Strongly advised him to start checking sugars at different times of the day - check 1x a day, rotating checks - discussed about CBG targets for treatment:  80-130 mg/dL before meals and <180 mg/dL after meals; target HbA1c <7%. - given sugar log and advised how to fill it and to bring it at next appt  - given foot care handout and  explained the principles  - given instructions for hypoglycemia management "15-15 rule"  - advised for yearly eye exams  - Return to clinic in 3 mo with sugar log   Philemon Kingdom, MD PhD [facility] Endocrinology

## 2018-07-01 ENCOUNTER — Telehealth: Payer: Self-pay

## 2018-07-01 MED FILL — LISINOPRIL 20 MG TABLET: 20 | 90 days supply | Qty: 90 | Fill #0

## 2018-07-01 MED FILL — JARDIANCE 10 MG TABLET: 10 | 90 days supply | Qty: 90 | Fill #0

## 2018-07-01 MED FILL — TRULICITY 0.75 MG/0.5 ML PE: 0.75 | 28 days supply | Qty: 2 | Fill #0

## 2018-07-01 NOTE — Telephone Encounter (Signed)
PA request for Trulicity sent.

## 2018-07-06 ENCOUNTER — Telehealth: Payer: Self-pay

## 2018-07-06 NOTE — Telephone Encounter (Signed)
Prior authorization for Trulicity  has been approved by patient's insurance.  Coverage is effective for a maximum of 12 refills from 07/03/2018 to 07/02/2019  PA Auth# [id number]  Approval letter has been sent to scanning.

## 2018-07-20 MED FILL — VASCEPA 1 GM CAPSULE: 1 | 30 days supply | Qty: 120 | Fill #2

## 2018-07-31 MED FILL — TRULICITY 0.75 MG/0.5 ML PE: 0.75 | 28 days supply | Qty: 2 | Fill #1

## 2018-08-20 MED FILL — SIMVASTATIN 40 MG TABLET: 40 | 90 days supply | Qty: 90 | Fill #1

## 2018-08-20 MED FILL — PANTOPRAZOLE SOD DR 20 MG T: 20 | 90 days supply | Qty: 90 | Fill #1

## 2018-08-20 MED FILL — GABAPENTIN 600 MG TABS: 600 | 90 days supply | Qty: 180 | Fill #1

## 2018-08-20 MED FILL — VASCEPA 1 GM CAPSULE: 1 | 30 days supply | Qty: 120 | Fill #3

## 2018-08-28 ENCOUNTER — Other Ambulatory Visit: Payer: Self-pay | Admitting: Internal Medicine

## 2018-08-28 MED FILL — TRULICITY 0.75 MG/0.5 ML PE: 0.75 | 28 days supply | Qty: 2 | Fill #0

## 2018-09-28 MED FILL — TRULICITY 0.75 MG/0.5 ML PE: 0.75 | 28 days supply | Qty: 2 | Fill #1

## 2018-09-28 MED FILL — LISINOPRIL 20 MG TABLET: 20 | 90 days supply | Qty: 90 | Fill #1

## 2018-09-28 MED FILL — JARDIANCE 10 MG TABLET: 10 | 90 days supply | Qty: 90 | Fill #1

## 2018-09-28 MED FILL — VASCEPA 1 GM CAPSULE: 1 | 30 days supply | Qty: 120 | Fill #4

## 2018-10-06 ENCOUNTER — Ambulatory Visit: Payer: 59 | Admitting: Internal Medicine

## 2018-10-27 MED FILL — metFORMIN HCL 1000 MG TABS: 1000 | 90 days supply | Qty: 180 | Fill #1

## 2018-11-08 ENCOUNTER — Telehealth: Payer: 59 | Admitting: Physician Assistant

## 2018-11-08 DIAGNOSIS — M79606 Pain in leg, unspecified: Secondary | ICD-10-CM

## 2018-11-08 NOTE — Progress Notes (Signed)
Based on what you shared with me, I feel your condition warrants further evaluation and I recommend that you be seen for a face to face office visit.  Due to the severity of your symptoms and your medical history, you will need further evaluation face to face as you will likely warrant additional testing to obtain the appropriate diagnosis and treatment plan.   NOTE: If you entered your credit card information for this eVisit, you will not be charged. You may see a "hold" on your card for the $35 but that hold will drop off and you will not have a charge processed.  If you are having a true medical emergency please call 911.    The following sites will take your insurance:  . [facility]    [phone]                  Get Driving Directions  [address], [place] [address] . 10 am to 8 pm Monday-Friday . 12 pm to 8 pm Saturday-Sunday   . [facility] [place]                  Get Driving Directions  [address], [place], [place] [address] . 8 am to 8 pm Monday-Friday . 9 am to 6 pm Saturday . 11 am to 6 pm Sunday   . [facility] at [place]                  Get Driving Directions   [address], [place] [address] . 8 am to 8 pm Monday-Friday . 8 am to 4 pm Saturday-Sunday    . [facility] [place]                    Get Driving Directions  [phone]  [address], [place], [place] [address]  . Monday-Friday, 12 PM to 6 PM    Your e-visit answers were reviewed by a board certified advanced clinical practitioner to complete your personal care plan.  Thank you for using e-Visits.  I have spent 5 minutes in review of e-visit questionnaire, review and updating patient chart, medical decision making and response to patient.    Tenna Delaine, PA-C

## 2018-11-09 ENCOUNTER — Other Ambulatory Visit: Payer: Self-pay

## 2018-11-09 ENCOUNTER — Encounter (HOSPITAL_BASED_OUTPATIENT_CLINIC_OR_DEPARTMENT_OTHER): Payer: Self-pay

## 2018-11-09 ENCOUNTER — Emergency Department (HOSPITAL_BASED_OUTPATIENT_CLINIC_OR_DEPARTMENT_OTHER)
Admission: EM | Admit: 2018-11-09 | Discharge: 2018-11-09 | Disposition: A | Discharge status: Home / Self Care | Payer: 59 | Attending: Emergency Medicine | Admitting: Emergency Medicine

## 2018-11-09 DIAGNOSIS — I10 Essential (primary) hypertension: Secondary | ICD-10-CM | POA: Insufficient documentation

## 2018-11-09 DIAGNOSIS — L03115 Cellulitis of right lower limb: Secondary | ICD-10-CM | POA: Insufficient documentation

## 2018-11-09 DIAGNOSIS — L539 Erythematous condition, unspecified: Secondary | ICD-10-CM | POA: Diagnosis present

## 2018-11-09 DIAGNOSIS — E119 Type 2 diabetes mellitus without complications: Secondary | ICD-10-CM | POA: Diagnosis not present

## 2018-11-09 DIAGNOSIS — Z7982 Long term (current) use of aspirin: Secondary | ICD-10-CM | POA: Insufficient documentation

## 2018-11-09 DIAGNOSIS — Z79899 Other long term (current) drug therapy: Secondary | ICD-10-CM | POA: Insufficient documentation

## 2018-11-09 DIAGNOSIS — Z7984 Long term (current) use of oral hypoglycemic drugs: Secondary | ICD-10-CM | POA: Diagnosis not present

## 2018-11-09 LAB — CBC WITH DIFFERENTIAL/PLATELET
Abs Immature Granulocytes: 0.05 10*3/uL (ref 0.00–0.07)
Basophils Absolute: 0 10*3/uL (ref 0.0–0.1)
Basophils Relative: 0 %
Eosinophils Absolute: 0.1 10*3/uL (ref 0.0–0.5)
Eosinophils Relative: 1 %
HCT: 45 % (ref 39.0–52.0)
Hemoglobin: 14.9 g/dL (ref 13.0–17.0)
Immature Granulocytes: 1 %
Lymphocytes Relative: 15 %
Lymphs Abs: 1.6 10*3/uL (ref 0.7–4.0)
MCH: 27.7 pg (ref 26.0–34.0)
MCHC: 33.1 g/dL (ref 30.0–36.0)
MCV: 83.8 fL (ref 80.0–100.0)
Monocytes Absolute: 0.8 10*3/uL (ref 0.1–1.0)
Monocytes Relative: 7 %
Neutro Abs: 8.3 10*3/uL — ABNORMAL HIGH (ref 1.7–7.7)
Neutrophils Relative %: 76 %
Platelets: 228 10*3/uL (ref 150–400)
RBC: 5.37 MIL/uL (ref 4.22–5.81)
RDW: 13.2 % (ref 11.5–15.5)
WBC: 10.8 10*3/uL — ABNORMAL HIGH (ref 4.0–10.5)
nRBC: 0 % (ref 0.0–0.2)

## 2018-11-09 LAB — BASIC METABOLIC PANEL
Anion gap: 10 (ref 5–15)
BUN: 23 mg/dL — ABNORMAL HIGH (ref 6–20)
CO2: 24 mmol/L (ref 22–32)
Calcium: 8.7 mg/dL — ABNORMAL LOW (ref 8.9–10.3)
Chloride: 100 mmol/L (ref 98–111)
Creatinine, Ser: 1.09 mg/dL (ref 0.61–1.24)
GFR calc Af Amer: >60 mL/min (ref >60)
GFR calc non Af Amer: >60 mL/min (ref >60)
Glucose, Bld: 290 mg/dL — ABNORMAL HIGH (ref 70–99)
Potassium: 4 mmol/L (ref 3.5–5.1)
Sodium: 134 mmol/L — ABNORMAL LOW (ref 135–145)

## 2018-11-09 MED ORDER — SODIUM CHLORIDE 0.9 % IV BOLUS
1000.0000 mL | Freq: Once | INTRAVENOUS | Status: AC
  Administered 2018-11-09: 1000 mL via INTRAVENOUS

## 2018-11-09 MED ORDER — CEFAZOLIN SODIUM-DEXTROSE 1-4 GM/50ML-% IV SOLN
1.0000 g | Freq: Once | INTRAVENOUS | Status: AC
  Administered 2018-11-09: 1 g via INTRAVENOUS
  Filled 2018-11-09: qty 50

## 2018-11-09 MED ORDER — SODIUM CHLORIDE 0.9 % IV SOLN
INTRAVENOUS | Status: DC | PRN
  Administered 2018-11-09: 250 mL via INTRAVENOUS

## 2018-11-09 MED ORDER — CEPHALEXIN 500 MG PO CAPS: 500.0000 mg | ORAL_CAPSULE | Freq: Four times a day (QID) | ORAL | 0 refills | Status: DC

## 2018-11-09 MED FILL — CEPHALEXIN 500 MG CAPSULE: 500 | 5 days supply | Qty: 20 | Fill #0

## 2018-11-09 MED FILL — VASCEPA 1 GM CAPSULE: 1 | 30 days supply | Qty: 120 | Fill #5

## 2018-11-09 NOTE — ED Provider Notes (Signed)
[facility] EMERGENCY DEPARTMENT Provider Note   CSN: [id number] Arrival date & time: 11/09/18  1510    History   Chief Complaint Chief Complaint  Patient presents with  . Leg Problem    HPI Victor Estrada is a 48 y.o. male.     HPI  Victor Estrada is a 48 y.o. male, with a history of DM, hyperlipidemia, HTN, GERD, presenting to the ED with erythema and some pain noted to the right lower extremity beginning around the ankle 2 days ago.  The redness has spread since onset.  Accompanied by chills.  He later noticed a small wound to the anterior lower leg, but denies any drainage from the region. Pain is mild at rest, moderate with movement or palpation.  States he has had cellulitis in the past and this feels and appears similar.  No history of MRSA infection, neg previous MRSA swab.  Denies fever, numbness, weakness, known injury, shortness of breath, chest pain, or any other complaints.  Past Medical History:  Diagnosis Date  . Anxiety   . Complication of anesthesia   . Constipation   . Depression   . Diabetes mellitus   . GERD (gastroesophageal reflux disease)   . Head injury, acute, without loss of consciousness   . History of kidney stones   . Hyperlipidemia   . Hypertension   . Kidney stone   . PONV (postoperative nausea and vomiting)    post wisdom teeth  . Shortness of breath    with exertion  . Sinus disorder   . Sleep apnea     Patient Active Problem List   Diagnosis Date Noted  . Poorly controlled type 2 diabetes mellitus with peripheral neuropathy ([place]) 06/26/2018  . Hypersomnia 01/19/2016  . OSA (obstructive sleep apnea) 11/03/2015  . Abdominal wall strain 07/28/2014  . URI (upper respiratory infection) 04/20/2014  . Cellulitis and abscess of face 11/11/2013  . GERD (gastroesophageal reflux disease) 05/28/2013  . Allergic rhinitis 05/28/2013  . Routine general medical examination at a health care facility 05/28/2013  . Adjustment  disorder with mixed anxiety and depressed mood 01/27/2012  . Sinusitis 12/20/2011  . HTN (hypertension) 05/23/2011  . Hyperlipidemia 05/23/2011  . Morbid obesity ([place]) 05/23/2011    Past Surgical History:  Procedure Laterality Date  . DENTAL SURGERY    . INCISION AND DRAINAGE ABSCESS Left 11/12/2013   Procedure: INCISION AND DRAINAGE LEFT CHEEK  ABSCESS;  Surgeon: Melida Quitter, MD;  Location: [facility];  Service: ENT;  Laterality: Left;  . NASAL SEPTOPLASTY W/ TURBINOPLASTY  04/26/2016  . NASAL SEPTOPLASTY W/ TURBINOPLASTY N/A 04/26/2016   Procedure: NASAL SEPTOPLASTY WITH TURBINATE REDUCTION;  Surgeon: Melida Quitter, MD;  Location: [facility];  Service: ENT;  Laterality: N/A;  . WISDOM TOOTH EXTRACTION          Home Medications    Prior to Admission medications   Medication Sig Start Date End Date Taking? Authorizing Provider  acetaminophen (TYLENOL) 500 MG tablet Take 1,000 mg by mouth every 6 (six) hours as needed (for pain).     [provider]  aspirin EC 81 MG tablet Take 162 mg by mouth daily.    [provider]  buPROPion (WELLBUTRIN XL) 300 MG 24 hr tablet Take 300 mg by mouth daily.    [provider]  cephALEXin (KEFLEX) 500 MG capsule Take 1 capsule (500 mg total) by mouth 4 (four) times daily. 11/09/18   Joy, Shawn C, PA-C  empagliflozin (JARDIANCE)  10 MG TABS tablet Take 10 mg by mouth daily. 11/06/15   Midge Minium, MD  fexofenadine (ALLEGRA) 180 MG tablet Take by mouth.    [provider]  gabapentin (NEURONTIN) 400 MG capsule  04/02/18   [provider]  lisinopril (PRINIVIL,ZESTRIL) 20 MG tablet TAKE 1 TABLET (20 MG TOTAL) BY MOUTH DAILY. 02/26/16   Midge Minium, MD  metFORMIN (GLUCOPHAGE) 1000 MG tablet Take 1 tablet (1,000 mg total) by mouth 2 (two) times daily with a meal. 06/26/18   Philemon Kingdom, MD  Multiple Vitamin (MULTIVITAMIN) tablet Take 1 tablet by mouth daily.    [provider]  pantoprazole  (PROTONIX) 20 MG tablet Take 1 tablet by mouth daily. 03/13/16   [provider]  simvastatin (ZOCOR) 20 MG tablet TAKE 1 TABLET BY MOUTH DAILY EVERY EVENING 11/10/15   Midge Minium, MD  sitaGLIPtin-metformin (JANUMET) 50-1000 MG tablet TAKE 1 TABLET BY MOUTH 2 (TWO) TIMES DAILY WITH A MEAL. 11/03/15   Midge Minium, MD  TRINTELLIX 20 MG TABS Take 1 tablet by mouth daily. 04/03/16   [provider]  TRULICITY 5.62 BW/3.8LH SOPN INJECT 0.75 MG IN AM WEEKLY UNDER SKIN 08/28/18   Philemon Kingdom, MD  VASCEPA 1 g CAPS  05/20/18   [provider]    Family History Family History  Problem Relation Age of Onset  . Hyperlipidemia Father   . Heart disease Father   . Diabetes Father   . Heart disease Maternal Grandmother   . Diabetes Paternal Grandmother   . Cancer Mother     Social History Social History   Tobacco Use  . Smoking status: Never Smoker  . Smokeless tobacco: Never Used  Substance Use Topics  . Alcohol use: No  . Drug use: No     Allergies   Patient has no known allergies.   Review of Systems Review of Systems  Constitutional: Positive for chills. Negative for fever.  Respiratory: Negative for shortness of breath.   Cardiovascular: Negative for chest pain.  Gastrointestinal: Negative for abdominal pain, nausea and vomiting.  Skin: Positive for color change and wound.  Neurological: Negative for weakness and numbness.  All other systems reviewed and are negative.    Physical Exam Updated Vital Signs BP 129/82 (BP Location: Left Arm)   Pulse (!) 102   Temp 98.8 F (37.1 C) (Oral)   Resp 18   Ht 5\' 10"  (1.778 m)   Wt 130.2 kg   SpO2 97%   BMI 41.18 kg/m   Physical Exam Vitals signs and nursing note reviewed.  Constitutional:      General: He is not in acute distress.    Appearance: He is well-developed. He is not diaphoretic.  HENT:     Head: Normocephalic and atraumatic.     Mouth/Throat:     Mouth: Mucous  membranes are moist.     Pharynx: Oropharynx is clear.  Eyes:     Conjunctiva/sclera: Conjunctivae normal.  Neck:     Musculoskeletal: Neck supple.  Cardiovascular:     Rate and Rhythm: Normal rate and regular rhythm.     Pulses: Normal pulses.          Radial pulses are 2+ on the right side and 2+ on the left side.       Dorsalis pedis pulses are 2+ on the right side and 2+ on the left side.       Posterior tibial pulses are 2+ on the right side  and 2+ on the left side.     Heart sounds: Normal heart sounds.     Comments: Tactile temperature in the extremities appropriate and equal bilaterally. Pulmonary:     Effort: Pulmonary effort is normal. No respiratory distress.     Breath sounds: Normal breath sounds.  Abdominal:     Palpations: Abdomen is soft.     Tenderness: There is no abdominal tenderness. There is no guarding.  Musculoskeletal:     Right lower leg: Edema present.     Left lower leg: No edema.     Comments: Erythema in the distribution shown in the pictures below.  Associated tenderness and localized swelling.  There is a very small wound to the anterior right lower leg, but no fluctuance or drainage.  No tenderness in the calf or through the rest of the deep venous distribution of the right lower extremity.  Lymphadenopathy:     Cervical: No cervical adenopathy.  Skin:    General: Skin is warm and dry.  Neurological:     Mental Status: He is alert.     Comments: Sensation to light touch grossly intact in the bilateral lower extremities to the patient's baseline. Strength 5/5 in the bilateral lower extremities.  Psychiatric:        Mood and Affect: Mood and affect normal.        Speech: Speech normal.        Behavior: Behavior normal.                          ED Treatments / Results  Labs (all labs ordered are listed, but only abnormal results are displayed) Labs Reviewed  BASIC METABOLIC PANEL - Abnormal; Notable for the following  components:      Result Value   Sodium 134 (*)    Glucose, Bld 290 (*)    BUN 23 (*)    Calcium 8.7 (*)    All other components within normal limits  CBC WITH DIFFERENTIAL/PLATELET - Abnormal; Notable for the following components:   WBC 10.8 (*)    Neutro Abs 8.3 (*)    All other components within normal limits    EKG None  Radiology No results found.  Procedures Procedures (including critical care time)  Medications Ordered in ED Medications  sodium chloride 0.9 % bolus 1,000 mL ( Intravenous Stopped 11/09/18 1725)  ceFAZolin (ANCEF) IVPB 1 g/50 mL premix ( Intravenous Stopped 11/09/18 1700)     Initial Impression / Assessment and Plan / ED Course  I have reviewed the triage vital signs and the nursing notes.  Pertinent labs & imaging results that were available during my care of the patient were reviewed by me and considered in my medical decision making (see chart for details).  Clinical Course as of Nov 10 2302  Mon Nov 09, 2018  1559 Seems to be consistent with previous values.   Pulse Rate(!): 102 [SJ]    Clinical Course User Index [SJ] Joy, Shawn C, PA-C       Patient presents with erythema and pain to the skin of the right lower extremity beginning 2 days ago. Patient is nontoxic appearing, afebrile, not tachycardic on my exam, not tachypneic, not hypotensive, maintains excellent SPO2 on room air, and is in no apparent distress.  Mild leukocytosis of 10.8.  Based on exam findings and patient's history as a diabetic, diagnosis favors cellulitis rather than DVT.  Patient received a dose of IV antibiotic  here in the ED and was sent home on oral therapy.  He will have close follow-up with his PCP. The patient was given instructions for home care as well as return precautions. Patient voices understanding of these instructions, accepts the plan, and is comfortable with discharge.    Vitals:   11/09/18 1518 11/09/18 1519 11/09/18 1727  BP: 129/82  105/74  Pulse:  (!) 102  87  Resp: 18  16  Temp: 98.8 F (37.1 C)  98 F (36.7 C)  TempSrc: Oral  Oral  SpO2: 97%  100%  Weight:  130.2 kg   Height:  5\' 10"  (1.778 m)      Final Clinical Impressions(s) / ED Diagnoses   Final diagnoses:  Cellulitis of right lower extremity    ED Discharge Orders         Ordered    cephALEXin (KEFLEX) 500 MG capsule  4 times daily     11/09/18 Andrews, Shawn C, PA-C 11/10/18 2307    Tegeler, Gwenyth Allegra, MD 11/11/18 [phone]

## 2018-11-09 NOTE — ED Triage Notes (Signed)
Pt c/o redness to right LE-first noticed 2 days ago-no known injury of break in the skin-NAD-steady gait

## 2018-11-09 NOTE — Discharge Instructions (Signed)
Your symptoms appear to be consistent with cellulitis, a skin infection.  Please take all of your antibiotics until finished!   You may develop abdominal discomfort or diarrhea from the antibiotic.  You may help offset this with probiotics which you can buy or get in yogurt. Do not eat or take the probiotics until 2 hours after your antibiotic.   Follow-up with your primary care provider in about 2 to 3 days for recheck.  Return to the emergency department for worsening pain, spreading redness, worsening swelling, persistent fever, or any other major concerns.

## 2018-11-18 ENCOUNTER — Other Ambulatory Visit: Payer: Self-pay | Admitting: Internal Medicine

## 2018-11-18 MED FILL — TRULICITY 0.75 MG/0.5 ML PE: 0.75 | 28 days supply | Qty: 2 | Fill #0

## 2018-11-19 MED FILL — GABAPENTIN 600 MG TABS: 600 | 90 days supply | Qty: 180 | Fill #0

## 2018-11-27 MED FILL — PANTOPRAZOLE SOD DR 20 MG T: 20 | 90 days supply | Qty: 90 | Fill #0

## 2018-12-11 MED FILL — VASCEPA 1 GM CAPSULE: 1 | 30 days supply | Qty: 120 | Fill #0

## 2018-12-24 DIAGNOSIS — K219 Gastro-esophageal reflux disease without esophagitis: Secondary | ICD-10-CM | POA: Diagnosis not present

## 2018-12-24 DIAGNOSIS — F320 Major depressive disorder, single episode, mild: Secondary | ICD-10-CM | POA: Diagnosis not present

## 2018-12-24 DIAGNOSIS — E1169 Type 2 diabetes mellitus with other specified complication: Secondary | ICD-10-CM | POA: Diagnosis not present

## 2018-12-24 DIAGNOSIS — I10 Essential (primary) hypertension: Secondary | ICD-10-CM | POA: Diagnosis not present

## 2018-12-24 DIAGNOSIS — G4733 Obstructive sleep apnea (adult) (pediatric): Secondary | ICD-10-CM | POA: Diagnosis not present

## 2018-12-24 DIAGNOSIS — G629 Polyneuropathy, unspecified: Secondary | ICD-10-CM | POA: Diagnosis not present

## 2018-12-24 DIAGNOSIS — E7800 Pure hypercholesterolemia, unspecified: Secondary | ICD-10-CM | POA: Diagnosis not present

## 2018-12-25 ENCOUNTER — Ambulatory Visit (INDEPENDENT_AMBULATORY_CARE_PROVIDER_SITE_OTHER): Payer: 59 | Admitting: Internal Medicine

## 2018-12-25 ENCOUNTER — Encounter: Payer: Self-pay | Admitting: Internal Medicine

## 2018-12-25 ENCOUNTER — Other Ambulatory Visit: Payer: Self-pay

## 2018-12-25 DIAGNOSIS — E1142 Type 2 diabetes mellitus with diabetic polyneuropathy: Secondary | ICD-10-CM

## 2018-12-25 DIAGNOSIS — E785 Hyperlipidemia, unspecified: Secondary | ICD-10-CM

## 2018-12-25 DIAGNOSIS — E1165 Type 2 diabetes mellitus with hyperglycemia: Secondary | ICD-10-CM | POA: Diagnosis not present

## 2018-12-25 MED ORDER — TRULICITY 1.5 MG/0.5ML ~~LOC~~ SOAJ: 1.5000 mg | SUBCUTANEOUS | 11 refills | Status: DC

## 2018-12-25 MED FILL — SIMVASTATIN 40 MG TABLET: 40 | 90 days supply | Qty: 90 | Fill #0

## 2018-12-25 MED FILL — TRULICITY 1.5 MG/0.5 ML PEN: 1.5 | 28 days supply | Qty: 2 | Fill #0

## 2018-12-25 NOTE — Patient Instructions (Addendum)
Please continue: - Metformin 1000 mg 2x a day with meals - Jardiance 10 mg before b'fast  Please increase: - Trulicity 1.5 mg weekly  Please return in 3 months with your sugar log.

## 2018-12-25 NOTE — Progress Notes (Signed)
Patient ID: Victor Estrada, male   DOB: 1971/02/25, 48 y.o.   MRN: [id number]   Patient location: Home My location: Office  Referring Provider: Antony Contras, MD  I connected with the patient on 12/25/18 at  3:01 PM EDT by a video enabled telemedicine application and verified that I am speaking with the correct person.   I discussed the limitations of evaluation and management by telemedicine and the availability of in person appointments. The patient expressed understanding and agreed to proceed.   Details of the encounter are shown below.  HPI: Victor Estrada is a 48 y.o.-year-old male, referred by his previous PCP, Dr. Moreen Estrada, for management of DM2, dx in 2005, non-insulin-dependent, uncontrolled, with long-term complications (PN). His wife is also my pt: Victor Estrada. He saw Dr. Loanne Estrada in 2013.  First visit with me was 6 months ago (virtual).  Since last visit, he had a very high blood sugar of 290 in 10/2018 (he was not checking sugars then), however, at that time, he had an episode of leg cellulitis, which was treated since then: CMP Latest Ref Rng & Units 11/09/2018 04/23/2016 11/03/2015  Glucose 70 - 99 mg/dL 290(H) 233(H) 278(H)   Reviewed latest HbA1c level: 05/19/2018: HbA1c 11% 04/01/2017: HbA1c 11.2% Lab Results  Component Value Date   HGBA1C 11.1 (H) 11/03/2015   HGBA1C 10.5 (H) 02/06/2015   HGBA1C 6.6 (H) 09/14/2013   HGBA1C 6.8 (H) 05/28/2013   HGBA1C 10.5 (H) 01/27/2012   HGBA1C 9.9 (H) 10/01/2011   HGBA1C 9.9 (H) 05/23/2011   At last visit, he was on: - Janumet 50-1000 mg 2x a day, with meals - Jardiance 10 mg before breakfast He tried Glimepiride in the past.  We changed to: - Jardiance 10 mg before b'fast - Metformin 1000 mg 2x a day with meals - Trulicity A999333 mg weekly - missed doses few mo ago  - not now  He was not checking sugars at last visit, now checking 0-1 time a day: - am: 306 >> 98, 100-150, 210 - 2h after b'fast: n/c - before lunch: n/c - 2h after  lunch: n/c - before dinner: n/c - 2h after dinner: n/c - bedtime: n/c - nighttime: n/c Lowest sugar was 90 >> 98; he has hypoglycemia awareness in the 90s. Highest sugar was [id number] >> [id number] (off Trulicity).  Glucometer: One Touch ultra >> Freestyle  Pt's meals are: - Breakfast: skip - Lunch: lean cuisine, fast food - Dinner: same - Snacks: protein bars, PB crackers, fruit No more regular sodas in last month.  -+ Mild CKD, last BUN/creatinine:  Lab Results  Component Value Date   BUN 23 (H) 11/09/2018   BUN 19 04/23/2016   CREATININE 1.09 11/09/2018   CREATININE 1.29 (H) 04/23/2016  05/19/2018: 24/1.07, GFR 74, glucose 306, otherwise normal CMP On lisinopril 20.  -+ HL; last set of lipids: 05/19/2018: 368/1181/33/not calculated Lab Results  Component Value Date   CHOL 151 11/03/2015   HDL 37 (L) 11/03/2015   LDLCALC 48 11/03/2015   LDLDIRECT 92.0 02/06/2015   TRIG 330 (H) 11/03/2015   CHOLHDL 4.1 11/03/2015  On Zocor 40, Vascepa- added 04/2018  - last eye exam was in 2018: No DR  -He does have numbness and tingling in his feet.  On Neurontin 600 mg twice a day.  Pt has FH of DM in father.  She also has a history of HTN, GERD, OSA, obesity class III, kidney stones.  He has a family history of bile duct  cancer in mother.  ROS: Constitutional: no weight gain/+ weight loss (~5 lbs), + fatigue, no subjective hyperthermia, no subjective hypothermia, + poor sleep Eyes: no blurry vision, no xerophthalmia ENT: no sore throat, no nodules palpated in neck, no dysphagia, no odynophagia, no hoarseness Cardiovascular: no CP/no SOB/no palpitations/+ leg swelling Respiratory: no cough/no SOB/no wheezing Gastrointestinal: no N/no V/no D/no C/+ acid reflux Musculoskeletal: no muscle aches/no joint aches Skin: no rashes, no hair loss Neurological: no tremors/no numbness/no tingling/no dizziness  I reviewed pt's medications, allergies, PMH, social hx, family hx, and changes were  documented in the history of present illness. Otherwise, unchanged from my initial visit note.  Past Medical History:  Diagnosis Date  . Anxiety   . Complication of anesthesia   . Constipation   . Depression   . Diabetes mellitus   . GERD (gastroesophageal reflux disease)   . Head injury, acute, without loss of consciousness   . History of kidney stones   . Hyperlipidemia   . Hypertension   . Kidney stone   . PONV (postoperative nausea and vomiting)    post wisdom teeth  . Shortness of breath    with exertion  . Sinus disorder   . Sleep apnea    Past Surgical History:  Procedure Laterality Date  . DENTAL SURGERY    . INCISION AND DRAINAGE ABSCESS Left 11/12/2013   Procedure: INCISION AND DRAINAGE LEFT CHEEK  ABSCESS;  Surgeon: Melida Quitter, MD;  Location: [place];  Service: ENT;  Laterality: Left;  . NASAL SEPTOPLASTY W/ TURBINOPLASTY  04/26/2016  . NASAL SEPTOPLASTY W/ TURBINOPLASTY N/A 04/26/2016   Procedure: NASAL SEPTOPLASTY WITH TURBINATE REDUCTION;  Surgeon: Melida Quitter, MD;  Location: [facility];  Service: ENT;  Laterality: N/A;  . WISDOM TOOTH EXTRACTION     Social History   Socioeconomic History  . Marital status: Married  Occupational History  Social Needs  . Financial resource strain: Not on file  . Food insecurity:    Worry: Not on file    Inability: Not on file  . Transportation needs:    Medical: Not on file    Non-medical: Not on file  Tobacco Use  . Smoking status: Never Smoker  . Smokeless tobacco: Never Used  Substance and Sexual Activity  . Alcohol use: No  . Drug use: No  . Sexual activity: Not on file  Lifestyle  . Physical activity:    Days per week: Not on file    Minutes per session: Not on file  . Stress: Not on file  Relationships  . Social connections:    Talks on phone: Not on file    Gets together: Not on file    Attends religious service: Not on file    Active member of club or organization: Not on file    Attends meetings of clubs  or organizations: Not on file    Relationship status: Not on file  . Intimate partner violence:    Fear of current or ex partner: Not on file    Emotionally abused: Not on file    Physically abused: Not on file    Forced sexual activity: Not on file  Other Topics Concern  . 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 acetaminophen (TYLENOL) 500 MG tablet Take 1,000 mg by mouth every 6 (six)  hours as needed (for pain).     Marland Kitchen aspirin EC 81 MG tablet Take 162 mg by mouth daily.    Marland Kitchen buPROPion (WELLBUTRIN XL) 300 MG 24 hr tablet Take 300 mg by mouth daily.    . cephALEXin (KEFLEX) 500 MG capsule Take 1 capsule (500 mg total) by mouth 4 (four) times daily. 20 capsule 0  . empagliflozin (JARDIANCE) 10 MG TABS tablet Take 10 mg by mouth daily. 30 tablet 6  . fexofenadine (ALLEGRA) 180 MG tablet Take by mouth.    . gabapentin (NEURONTIN) 400 MG capsule     . lisinopril (PRINIVIL,ZESTRIL) 20 MG tablet TAKE 1 TABLET (20 MG TOTAL) BY MOUTH DAILY. 90 tablet 0  . metFORMIN (GLUCOPHAGE) 1000 MG tablet Take 1 tablet (1,000 mg total) by mouth 2 (two) times daily with a meal. 180 tablet 3  . Multiple Vitamin (MULTIVITAMIN) tablet Take 1 tablet by mouth daily.    . pantoprazole (PROTONIX) 20 MG tablet Take 1 tablet by mouth daily.  3  . simvastatin (ZOCOR) 20 MG tablet TAKE 1 TABLET BY MOUTH DAILY EVERY EVENING 30 tablet 6  . sitaGLIPtin-metformin (JANUMET) 50-1000 MG tablet TAKE 1 TABLET BY MOUTH 2 (TWO) TIMES DAILY WITH A MEAL. 60 tablet 6  . TRINTELLIX 20 MG TABS Take 1 tablet by mouth daily.  0  . TRULICITY A999333 0000000 SOPN INJECT 0.75 MG IN AM WEEKLY UNDER SKIN 2 mL 1  . VASCEPA 1 g CAPS      No current facility-administered medications on file prior to visit.    No Known Allergies Family History  Problem Relation Age of Onset  .  Hyperlipidemia Father   . Heart disease Father   . Diabetes Father   . Heart disease Maternal Grandmother   . Diabetes Paternal Grandmother   . Cancer Mother     PE: There were no vitals taken for this visit. Wt Readings from Last 3 Encounters:  11/09/18 287 lb (130.2 kg)  06/26/18 294 lb (133.4 kg)  06/25/16 (!) 309 lb 12.8 oz (140.5 kg)   Constitutional:  in NAD  The physical exam was not performed (virtual visit).  ASSESSMENT: 1. DM2, non-insulin-dependent, uncontrolled, with long-term complications -PN -Mild CKD  2. HL  3. Obesity  PLAN:  1. Patient with longstanding, uncontrolled, type 2 diabetes, on oral antidiabetic regimen and GLP-1 receptor agonist added at last visit.  At last visit, we discussed about options for treatment and he was interested in trying to improve his blood sugars by diet.  In 2015, when he improved his diet, his HbA1c levels were lower than 7%.  We discussed about reducing fatty foods in his diet to improve his insulin resistance. He refused a referral to nutrition.  At that time, he was reticent to add insulin, so I suggested to switch from Januvia to Trulicity. -At this visit, he tells me that his sugars have improved after his cellulitis and gastroenteritis episodes.  He was also not taking the Trulicity consistently around that time and occasionally skips the rest of his diabetic medicines.  Now he is taking this consistently, however, upon questioning, he is just on the 0.75 mg weekly, and did not increase to the 1.5 mg Trulicity weekly.  We will increase this dose now and I advised him to let me know in the next 1 to 2 months if the sugars do not improve, in which case, we will need to increase the dose further to 3 mg weekly, since this is  a dose that was recently approved by FDA. -For now, we will continue the rest of his regimen - I suggested to:  Patient Instructions  Please continue: - Metformin 1000 mg 2x a day with meals - Jardiance 10 mg  before b'fast  Please increase: - Trulicity 1.5 mg weekly  Please return in 3 months with your sugar log.   - we would check his HbA1c when you return to the clinic - advised to check sugars at different times of the day - 1x a day, rotating check times - advised for yearly eye exams >> he is not UTD - return to clinic in 3 months  2. HL - Reviewed latest lipid panel from 2019: LDL at goal, triglycerides high, HDL low Lab Results  Component Value Date   CHOL 151 11/03/2015   HDL 37 (L) 11/03/2015   LDLCALC 48 11/03/2015   LDLDIRECT 92.0 02/06/2015   TRIG 330 (H) 11/03/2015   CHOLHDL 4.1 11/03/2015  - Continues the statin without side effects.  3. Obesity -Lost approximately 5 pounds since last visit -Continue to gently consider a GLP-1 receptor agonist, which should also help with weight loss  Philemon Kingdom, MD PhD [facility] End[facility]

## 2019-01-01 MED FILL — LISINOPRIL 20 MG TABS: 20 | 90 days supply | Qty: 90 | Fill #0

## 2019-01-01 MED FILL — JARDIANCE 10 MG TABLET: 10 | 90 days supply | Qty: 90 | Fill #0

## 2019-01-18 MED FILL — TRULICITY 1.5 MG/0.5 ML PEN: 1.5 | 28 days supply | Qty: 2 | Fill #1

## 2019-01-18 MED FILL — VASCEPA 1 GM CAPSULE: 1 | 30 days supply | Qty: 120 | Fill #0

## 2019-01-25 MED FILL — metFORMIN HCL 1000 MG TABS: 1000 | 90 days supply | Qty: 180 | Fill #2

## 2019-02-17 MED FILL — VASCEPA 1 GM CAPSULE: 1 | 30 days supply | Qty: 120 | Fill #0

## 2019-02-17 MED FILL — TRULICITY 1.5 MG/0.5 ML PEN: 1.5 | 28 days supply | Qty: 2 | Fill #2

## 2019-02-24 MED FILL — PANTOPRAZOLE SOD DR 20 MG T: 20 | 90 days supply | Qty: 90 | Fill #0

## 2019-02-24 MED FILL — GABAPENTIN 600 MG TABLET: 600 | 90 days supply | Qty: 180 | Fill #0

## 2019-03-23 MED FILL — VASCEPA 1 GM CAPSULE: 1 | 30 days supply | Qty: 120 | Fill #1

## 2019-03-23 MED FILL — TRULICITY 1.5 MG/0.5 ML PEN: 1.5 | 28 days supply | Qty: 2 | Fill #3

## 2019-03-29 MED FILL — JARDIANCE 10 MG TABLET: 10 | 90 days supply | Qty: 90 | Fill #1

## 2019-03-29 MED FILL — SIMVASTATIN 40 MG TABS: 40 | 90 days supply | Qty: 90 | Fill #0

## 2019-04-08 MED FILL — LISINOPRIL 20 MG TABLET: 20 | 90 days supply | Qty: 90 | Fill #1

## 2019-04-13 DIAGNOSIS — E7800 Pure hypercholesterolemia, unspecified: Secondary | ICD-10-CM | POA: Diagnosis not present

## 2019-04-13 DIAGNOSIS — Z794 Long term (current) use of insulin: Secondary | ICD-10-CM | POA: Diagnosis not present

## 2019-04-13 DIAGNOSIS — E1169 Type 2 diabetes mellitus with other specified complication: Secondary | ICD-10-CM | POA: Diagnosis not present

## 2019-04-13 LAB — HEMOGLOBIN A1C: Hemoglobin A1C: 9.8

## 2019-04-16 ENCOUNTER — Encounter: Payer: Self-pay | Admitting: Internal Medicine

## 2019-04-23 MED FILL — metFORMIN HCL 1000 MG TABS: 1000 | 90 days supply | Qty: 180 | Fill #3

## 2019-04-23 MED FILL — VASCEPA 1 GM CAPSULE: 1 | 30 days supply | Qty: 120 | Fill #2

## 2019-04-23 MED FILL — TRULICITY 1.5 MG/0.5 ML PEN: 1.5 | 28 days supply | Qty: 2 | Fill #4

## 2019-05-24 MED FILL — GABAPENTIN 600 MG TABLET: 600 | 90 days supply | Qty: 180 | Fill #1

## 2019-05-24 MED FILL — TRULICITY 1.5 MG/0.5 ML PEN: 1.5 | 28 days supply | Qty: 2 | Fill #5

## 2019-05-24 MED FILL — PANTOPRAZOLE SOD DR 20 MG T: 20 | 90 days supply | Qty: 90 | Fill #1

## 2019-05-24 MED FILL — VASCEPA 1 GM CAPSULE: 1 | 30 days supply | Qty: 120 | Fill #3

## 2019-06-17 DIAGNOSIS — E119 Type 2 diabetes mellitus without complications: Secondary | ICD-10-CM | POA: Diagnosis not present

## 2019-06-17 DIAGNOSIS — H52203 Unspecified astigmatism, bilateral: Secondary | ICD-10-CM | POA: Diagnosis not present

## 2019-06-17 LAB — HM DIABETES EYE EXAM

## 2019-06-21 MED FILL — SIMVASTATIN 40 MG TABS: 40 | 90 days supply | Qty: 90 | Fill #0

## 2019-06-21 MED FILL — JARDIANCE 10 MG TABLET: 10 | 90 days supply | Qty: 90 | Fill #0

## 2019-06-22 MED FILL — VASCEPA 1 GM CAPSULE: 1 | 30 days supply | Qty: 120 | Fill #4

## 2019-07-09 MED FILL — LISINOPRIL 20 MG TABS: 20 | 90 days supply | Qty: 90 | Fill #0

## 2019-07-28 ENCOUNTER — Other Ambulatory Visit: Payer: Self-pay | Admitting: Internal Medicine

## 2019-07-28 MED FILL — TRULICITY 1.5 MG/0.5 ML PEN: 1.5 | 28 days supply | Qty: 2 | Fill #6

## 2019-07-28 MED FILL — METFORMIN HCL 1000 MG TABS: 1000 | 90 days supply | Qty: 180 | Fill #0

## 2019-07-28 MED FILL — VASCEPA 1 GM CAPSULE: 1 | 30 days supply | Qty: 120 | Fill #5

## 2019-07-28 NOTE — Telephone Encounter (Signed)
LMTCB to schedule follow-up appointment.  

## 2019-07-28 NOTE — Telephone Encounter (Signed)
Ok to refill. Will need appt within the next 3 mo

## 2019-07-28 NOTE — Telephone Encounter (Signed)
Last office visit 12/25/2018  Cancel/No-show? none  Future office visit scheduled? no  Please advise on refill.

## 2019-07-28 NOTE — Telephone Encounter (Signed)
90 day supply sent.  Please contact patient to schedule follow up.

## 2019-07-29 NOTE — Telephone Encounter (Signed)
LMTCB to schedule appointment °

## 2019-08-04 ENCOUNTER — Encounter: Payer: Self-pay | Admitting: Internal Medicine

## 2019-08-04 NOTE — Telephone Encounter (Signed)
Left message Sent letter  Last attempt

## 2019-08-27 MED FILL — VASCEPA 1 GM CAPSULE: 1 | 30 days supply | Qty: 120 | Fill #0

## 2019-09-07 ENCOUNTER — Encounter: Payer: Self-pay | Admitting: Neurology

## 2019-09-24 MED FILL — SIMVASTATIN 40 MG TABS: 40 | 90 days supply | Qty: 90 | Fill #0

## 2019-09-24 MED FILL — VASCEPA 1 GM CAPSULE: 1 | 30 days supply | Qty: 120 | Fill #1

## 2019-09-24 MED FILL — JARDIANCE 10 MG TABLET: 10 | 90 days supply | Qty: 90 | Fill #0

## 2019-09-25 ENCOUNTER — Other Ambulatory Visit (HOSPITAL_BASED_OUTPATIENT_CLINIC_OR_DEPARTMENT_OTHER): Payer: Self-pay | Admitting: Family Medicine

## 2019-10-11 DIAGNOSIS — L720 Epidermal cyst: Secondary | ICD-10-CM | POA: Diagnosis not present

## 2019-10-11 DIAGNOSIS — L905 Scar conditions and fibrosis of skin: Secondary | ICD-10-CM | POA: Diagnosis not present

## 2019-10-11 DIAGNOSIS — L700 Acne vulgaris: Secondary | ICD-10-CM | POA: Diagnosis not present

## 2019-10-11 MED FILL — DOXYCYCLINE HYCLATE 100 MG: 100 | 30 days supply | Qty: 30 | Fill #0

## 2019-10-11 MED FILL — LISINOPRIL 20 MG TABS: 20 | 90 days supply | Qty: 90 | Fill #0

## 2019-10-11 MED FILL — CLINDAMYCIN PH 1% SOLUTION: 1 | 30 days supply | Qty: 60 | Fill #0

## 2019-10-25 ENCOUNTER — Other Ambulatory Visit: Payer: Self-pay

## 2019-10-25 ENCOUNTER — Telehealth: Payer: Self-pay | Admitting: Internal Medicine

## 2019-10-25 DIAGNOSIS — E1165 Type 2 diabetes mellitus with hyperglycemia: Secondary | ICD-10-CM

## 2019-10-25 DIAGNOSIS — E1142 Type 2 diabetes mellitus with diabetic polyneuropathy: Secondary | ICD-10-CM

## 2019-10-25 MED ORDER — METFORMIN HCL 1000 MG PO TABS: 1000.0000 mg | ORAL_TABLET | Freq: Two times a day (BID) | ORAL | 3 refills | Status: DC

## 2019-10-25 MED FILL — METFORMIN HCL 1000 MG TABS: 1000 | 30 days supply | Qty: 60 | Fill #0

## 2019-10-25 MED FILL — VASCEPA 1 GM CAPSULE: 1 | 30 days supply | Qty: 120 | Fill #0

## 2019-10-25 NOTE — Telephone Encounter (Signed)
Outpatient Medication Detail   Disp Refills Start End   metFORMIN (GLUCOPHAGE) 1000 MG tablet 60 tablet 3 10/25/2019    Sig - Route: Take 1 tablet (1,000 mg total) by mouth 2 (two) times daily with a meal. Take 1 tablet twice a day with a meal. NO FURTHER REFILLS WITHOUT APPOINTMENT - Oral   Sent to pharmacy as: metFORMIN (GLUCOPHAGE) 1000 MG tablet   E-Prescribing Status: Receipt confirmed by pharmacy (10/25/2019  4:39 PM EDT)

## 2019-10-25 NOTE — Telephone Encounter (Signed)
Patient called requesting refill - he scheduled a follow up for the next available appointment (November)  [place], [place] Phone:  [phone]  Fax:  [phone]

## 2019-11-10 MED FILL — DOXYCYCLINE HYCLATE 100 MG: 100 | 30 days supply | Qty: 30 | Fill #1

## 2019-11-10 MED FILL — CLINDAMYCIN PHOSPHATE 1 % S: 1 | 30 days supply | Qty: 60 | Fill #1

## 2019-11-17 NOTE — Progress Notes (Signed)
Victor Estrada - Initial Visit   Date: 11/19/19  Victor Estrada MRN: [id number] DOB: 30-Mar-1970   Dear Dr. Moreen Fowler:  Thank you for your kind referral of Victor Estrada for consultation of neuropathy. Although his history is well known to you, please allow [place] to reiterate it for the purpose of our medical record. The patient was accompanied to the clinic by self.   History of Present Illness: Victor Estrada is a 49 y.o. right-handed male with poorly controlled diabetes mellitus, hypertension, hyperlipidemia, depression,  presenting for evaluation of neuropathy.   He has burning pain in the toes for the past two years. Symptoms are constant without alleviating or exacerbating factors. He has been on gabapentin since 2019 and currently on 600mg  TID. He has noticed some improvement.  He also has numbness in the feet.  No imbalance, falls, or weakness.    He also complains of achy pain involving the arch, which is worse at night time.  His achy pain in the feet is worse at night time.  Out-side paper records, electronic medical record, and images have been reviewed where available and summarized as:  Lab Results  Component Value Date   HGBA1C 9.8 04/13/2019   No results found for: [id number] Lab Results  Component Value Date   TSH 1.30 11/03/2015   No results found for: ESRSEDRATE, POCTSEDRATE  Past Medical History:  Diagnosis Date  . Anxiety   . Complication of anesthesia   . Constipation   . Depression   . Diabetes mellitus   . GERD (gastroesophageal reflux disease)   . Head injury, acute, without loss of consciousness   . History of kidney stones   . Hyperlipidemia   . Hypertension   . Kidney stone   . PONV (postoperative nausea and vomiting)    post wisdom teeth  . Shortness of breath    with exertion  . Sinus disorder   . Sleep apnea     Past Surgical History:  Procedure Laterality Date  . DENTAL SURGERY    . INCISION AND  DRAINAGE ABSCESS Left 11/12/2013   Procedure: INCISION AND DRAINAGE LEFT CHEEK  ABSCESS;  Surgeon: Melida Quitter, MD;  Location: [place];  Service: ENT;  Laterality: Left;  . NASAL SEPTOPLASTY W/ TURBINOPLASTY  04/26/2016  . NASAL SEPTOPLASTY W/ TURBINOPLASTY N/A 04/26/2016   Procedure: NASAL SEPTOPLASTY WITH TURBINATE REDUCTION;  Surgeon: Melida Quitter, MD;  Location: [place];  Service: ENT;  Laterality: N/A;  . WISDOM TOOTH EXTRACTION       Medications:  Outpatient Encounter Medications as of 11/19/2019  Medication Sig  . acetaminophen (TYLENOL) 500 MG tablet Take 1,000 mg by mouth every 6 (six) hours as needed (for pain).   . clindamycin (CLEOCIN T) 1 % lotion Apply topically 2 (two) times daily.  Marland Kitchen doxycycline (MONODOX) 100 MG capsule Take 100 mg by mouth 2 (two) times daily.  . Dulaglutide (TRULICITY) 1.5 VQ/2.5ZD SOPN Inject 1.5 mg into the skin once a week.  . empagliflozin (JARDIANCE) 10 MG TABS tablet Take 10 mg by mouth daily.  . fexofenadine (ALLEGRA) 180 MG tablet Take by mouth.  . gabapentin (NEURONTIN) 400 MG capsule   . ibuprofen (ADVIL) 200 MG tablet Take 200 mg by mouth every 6 (six) hours as needed.  Marland Kitchen lisinopril (PRINIVIL,ZESTRIL) 20 MG tablet TAKE 1 TABLET (20 MG TOTAL) BY MOUTH DAILY.  . metFORMIN (GLUCOPHAGE) 1000 MG tablet Take 1 tablet (1,000 mg total) by mouth 2 (two) times  daily with a meal. Take 1 tablet twice a day with a meal. NO FURTHER REFILLS WITHOUT APPOINTMENT  . Multiple Vitamin (MULTIVITAMIN) tablet Take 1 tablet by mouth daily.  . pantoprazole (PROTONIX) 20 MG tablet Take 1 tablet by mouth daily.  . simvastatin (ZOCOR) 20 MG tablet TAKE 1 TABLET BY MOUTH DAILY EVERY EVENING  . VASCEPA 1 g CAPS   . aspirin EC 81 MG tablet Take 162 mg by mouth daily. (Patient not taking: Reported on 11/19/2019)  . buPROPion (WELLBUTRIN XL) 300 MG 24 hr tablet Take 300 mg by mouth daily. (Patient not taking: Reported on 11/19/2019)  . cephALEXin (KEFLEX) 500 MG capsule Take 1 capsule  (500 mg total) by mouth 4 (four) times daily. (Patient not taking: Reported on 11/19/2019)  . sitaGLIPtin-metformin (JANUMET) 50-1000 MG tablet TAKE 1 TABLET BY MOUTH 2 (TWO) TIMES DAILY WITH A MEAL. (Patient not taking: Reported on 11/19/2019)  . TRINTELLIX 20 MG TABS Take 1 tablet by mouth daily. (Patient not taking: Reported on 11/19/2019)   No facility-administered encounter medications on file as of 11/19/2019.    Allergies: No Known Allergies  Family History: Family History  Problem Relation Age of Onset  . Hyperlipidemia Father   . Heart disease Father   . Diabetes Father   . Heart disease Maternal Grandmother   . Diabetes Paternal Grandmother   . Cancer Mother     Social History: Social History   Tobacco Use  . Smoking status: Never Smoker  . Smokeless tobacco: Never Used  Vaping Use  . Vaping Use: Never used  Substance Use Topics  . Alcohol use: No  . 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  Speech is not dysarthric.  CRANIAL NERVES: II:  No visual field defects.   III-IV-VI: Pupils equal round and reactive to light.  Normal conjugate, extra-ocular eye movements in all directions of gaze.  No nystagmus.  No ptosis.   VII:  Normal facial movements.   VIII:  Normal hearing and vestibular function.   XI:  Normal shoulder shrug and head rotation.    MOTOR:  No atrophy, fasciculations or abnormal movements.  No pronator drift.   Upper Extremity:  Right  Left  Deltoid  5/5   5/5   Biceps  5/5   5/5   Triceps  5/5   5/5     Infraspinatus 5/5  5/5  Medial pectoralis 5/5  5/5  Wrist extensors  5/5   5/5   Wrist flexors  5/5   5/5   Finger extensors  5/5   5/5   Finger flexors  5/5   5/5   Dorsal interossei  5/5   5/5   Abductor pollicis  5/5   5/5   Tone (Ashworth scale)  0  0   Lower Extremity:  Right  Left  Hip flexors  5/5   5/5   Hip extensors  5/5   5/5  Adductor 5/5  5/5  Abductor 5/5  5/5  Knee flexors  5/5   5/5   Knee extensors  5/5   5/5   Dorsiflexors  5/5   5/5   Plantarflexors  5/5   5/5   Toe extensors  5/5   5/5   Toe flexors  5/5   5/5   Tone (Ashworth scale)  0  0   MSRs:  Right        Left                  brachioradialis 2+  2+  biceps 2+  2+  triceps 2+  2+  patellar 2+  2+  ankle jerk 0  0  Hoffman no  no  plantar response down  down   SENSORY:  Reduced vibration in a gradient pattern distal to ankles.  Temperature and pin prick intact.  Romberg's sign absent.   COORDINATION/GAIT: Normal finger-to- nose-finger.  Intact rapid alternating movements bilaterally. Gait narrow based and stable. Tandem and stressed gait intact.    IMPRESSION: 1. Diabetic peripheral neuropathy affecting the toes manifesting with burning pain and numbness  - Increase gabapentin to 900mg  three times daily  - Patient educated on daily foot inspection, fall prevention, and safety precautions around the home.  - Encouraged tight diabetes management to prevent worsening neuropathy  - Answered questions regarding pathogenesis and management options   2. Bilateral arch pain, likely musculoskeletal  - Follow-up with PCP/podiatry  Return to clinic in 4 months.  Thank you for allowing me to participate in patient's care.  If I can answer any additional questions, I would be pleased to do so.    Sincerely,    Daxen Lanum K. Posey Pronto, DO

## 2019-11-19 ENCOUNTER — Other Ambulatory Visit: Payer: Self-pay

## 2019-11-19 ENCOUNTER — Other Ambulatory Visit: Payer: Self-pay | Admitting: Neurology

## 2019-11-19 ENCOUNTER — Encounter: Payer: Self-pay | Admitting: Neurology

## 2019-11-19 ENCOUNTER — Ambulatory Visit: Payer: 59 | Admitting: Neurology

## 2019-11-19 VITALS — BP 126/84 | HR 104 | Ht 70.0 in | Wt 303.0 lb

## 2019-11-19 DIAGNOSIS — E1142 Type 2 diabetes mellitus with diabetic polyneuropathy: Secondary | ICD-10-CM

## 2019-11-19 DIAGNOSIS — M79673 Pain in unspecified foot: Secondary | ICD-10-CM | POA: Diagnosis not present

## 2019-11-19 DIAGNOSIS — L720 Epidermal cyst: Secondary | ICD-10-CM | POA: Diagnosis not present

## 2019-11-19 DIAGNOSIS — L700 Acne vulgaris: Secondary | ICD-10-CM | POA: Diagnosis not present

## 2019-11-19 MED ORDER — GABAPENTIN 600 MG PO TABS
900.0000 mg | ORAL_TABLET | Freq: Three times a day (TID) | ORAL | 3 refills | Status: DC
Start: 2019-11-19 — End: 2020-03-27

## 2019-11-19 MED FILL — GABAPENTIN 600 MG TABLET: 600 | 30 days supply | Qty: 135 | Fill #0

## 2019-11-19 NOTE — Patient Instructions (Addendum)
Increase gabapentin to 900mg  three times daily  Work with your doctor about keeping diabetes under control  Return to clinic in 4 months

## 2019-11-23 MED FILL — VASCEPA 1 GM CAPSULE: 1 | 30 days supply | Qty: 120 | Fill #0

## 2019-11-23 MED FILL — METFORMIN HCL 1000 MG TABS: 1000 | 30 days supply | Qty: 60 | Fill #1

## 2019-11-24 MED FILL — PANTOPRAZOLE SOD DR 20 MG T: 20 | 90 days supply | Qty: 90 | Fill #0

## 2019-11-24 MED FILL — TRULICITY 1.5 MG/0.5 ML PEN: 1.5 | 28 days supply | Qty: 2 | Fill #7

## 2019-12-16 MED FILL — DOXYCYCLINE HYCLATE 100 MG: 100 | 30 days supply | Qty: 30 | Fill #0

## 2019-12-21 MED FILL — GABAPENTIN 600 MG TABLET: 600 | 30 days supply | Qty: 135 | Fill #1

## 2019-12-21 MED FILL — VASCEPA 1 GM CAPSULE: 1 | 30 days supply | Qty: 120 | Fill #1

## 2019-12-21 MED FILL — METFORMIN HCL 1000 MG TABS: 1000 | 30 days supply | Qty: 60 | Fill #2

## 2019-12-22 MED FILL — JARDIANCE 10 MG TABLET: 10 | 90 days supply | Qty: 90 | Fill #0

## 2019-12-28 ENCOUNTER — Other Ambulatory Visit: Payer: Self-pay | Admitting: Internal Medicine

## 2019-12-28 MED FILL — TRULICITY 1.5 MG/0.5 ML PEN: 1.5 | 28 days supply | Qty: 2 | Fill #0

## 2019-12-30 ENCOUNTER — Other Ambulatory Visit (HOSPITAL_BASED_OUTPATIENT_CLINIC_OR_DEPARTMENT_OTHER): Payer: Self-pay | Admitting: Family Medicine

## 2019-12-30 DIAGNOSIS — F320 Major depressive disorder, single episode, mild: Secondary | ICD-10-CM | POA: Diagnosis not present

## 2019-12-30 DIAGNOSIS — L739 Follicular disorder, unspecified: Secondary | ICD-10-CM | POA: Diagnosis not present

## 2019-12-30 DIAGNOSIS — G4733 Obstructive sleep apnea (adult) (pediatric): Secondary | ICD-10-CM | POA: Diagnosis not present

## 2019-12-30 DIAGNOSIS — G629 Polyneuropathy, unspecified: Secondary | ICD-10-CM | POA: Diagnosis not present

## 2019-12-30 DIAGNOSIS — I10 Essential (primary) hypertension: Secondary | ICD-10-CM | POA: Diagnosis not present

## 2019-12-30 DIAGNOSIS — Z23 Encounter for immunization: Secondary | ICD-10-CM | POA: Diagnosis not present

## 2019-12-30 DIAGNOSIS — K219 Gastro-esophageal reflux disease without esophagitis: Secondary | ICD-10-CM | POA: Diagnosis not present

## 2019-12-30 DIAGNOSIS — E1169 Type 2 diabetes mellitus with other specified complication: Secondary | ICD-10-CM | POA: Diagnosis not present

## 2019-12-30 DIAGNOSIS — E7800 Pure hypercholesterolemia, unspecified: Secondary | ICD-10-CM | POA: Diagnosis not present

## 2019-12-30 LAB — HEMOGLOBIN A1C: Hemoglobin A1C: 10.5

## 2019-12-30 MED FILL — SIMVASTATIN 40 MG TABS: 40 | 90 days supply | Qty: 90 | Fill #0

## 2019-12-30 MED FILL — LISINOPRIL 20 MG TABS: 20 | 90 days supply | Qty: 90 | Fill #0

## 2020-01-21 MED FILL — GABAPENTIN 600 MG TABLET: 600 | 30 days supply | Qty: 135 | Fill #2

## 2020-01-21 MED FILL — METFORMIN HCL 1000 MG TABS: 1000 | 30 days supply | Qty: 60 | Fill #3

## 2020-01-24 MED FILL — VASCEPA 1 GM CAPSULE: 1 | 30 days supply | Qty: 120 | Fill #0

## 2020-01-28 ENCOUNTER — Other Ambulatory Visit: Payer: Self-pay

## 2020-01-28 ENCOUNTER — Other Ambulatory Visit: Payer: Self-pay | Admitting: Internal Medicine

## 2020-01-28 ENCOUNTER — Encounter: Payer: Self-pay | Admitting: Internal Medicine

## 2020-01-28 ENCOUNTER — Telehealth (INDEPENDENT_AMBULATORY_CARE_PROVIDER_SITE_OTHER): Payer: 59 | Admitting: Internal Medicine

## 2020-01-28 DIAGNOSIS — E1165 Type 2 diabetes mellitus with hyperglycemia: Secondary | ICD-10-CM

## 2020-01-28 DIAGNOSIS — E785 Hyperlipidemia, unspecified: Secondary | ICD-10-CM

## 2020-01-28 DIAGNOSIS — E669 Obesity, unspecified: Secondary | ICD-10-CM

## 2020-01-28 DIAGNOSIS — E1142 Type 2 diabetes mellitus with diabetic polyneuropathy: Secondary | ICD-10-CM | POA: Diagnosis not present

## 2020-01-28 MED ORDER — TRESIBA FLEXTOUCH 200 UNIT/ML ~~LOC~~ SOPN: 14.0000 [IU] | PEN_INJECTOR | Freq: Every day | SUBCUTANEOUS | 3 refills | Status: DC

## 2020-01-28 MED ORDER — INSULIN PEN NEEDLE 32G X 4 MM MISC: 3 refills | Status: DC

## 2020-01-28 MED ORDER — FREESTYLE LIBRE 2 READER DEVI: 1.0000 | Freq: Every day | 0 refills | Status: DC

## 2020-01-28 MED ORDER — FREESTYLE LIBRE 2 SENSOR MISC: 1.0000 | 3 refills | Status: DC

## 2020-01-28 MED FILL — TRESIBA FLEXTOUCH 200 UNITS: 200 | 85 days supply | Qty: 6 | Fill #0

## 2020-01-28 MED FILL — ULTICARE PEN NDL 4MM 32G: 32G X 4 MM | 90 days supply | Qty: 100 | Fill #0

## 2020-01-28 NOTE — Progress Notes (Signed)
Patient ID: Victor Estrada, male   DOB: [birth year], 49 y.o.   MRN: [id number]   Patient location: Car My location: Office Persons participating in the virtual visit: patient, provider  Referring Provider: Antony Contras, MD  I connected with the patient on 01/28/20 at  12:52 PM EDT by a video enabled telemedicine application and verified that I am speaking with the correct person.   I discussed the limitations of evaluation and management by telemedicine and the availability of in person appointments. The patient expressed understanding and agreed to proceed.   Details of the encounter are shown below.  HPI: Victor Estrada is a 49 y.o.-year-old male, initially referred by his previous PCP, Victor Estrada, returning for follow-up for DM2, dx in 2005, non-insulin-dependent, uncontrolled, with long-term complications (PN). His wife is also my pt: Victor Estrada. He saw Dr. Loanne Drilling in 2013.  He returns after an absence of 1 year.  Before last visit, she had an episode of cellulitis which resolved.  However, he had very high blood sugars then.  HbA1c increased.  Since last OV, he was off Trulicity for few weeks at a time - now more consistently in the last month.  Reviewed latest HbA1c level: Lab Results  Component Value Date   HGBA1C 10.5 12/30/2019   HGBA1C 9.8 04/13/2019   HGBA1C 11.1 (H) 11/03/2015   HGBA1C 10.5 (H) 02/06/2015   HGBA1C 6.6 (H) 09/14/2013   HGBA1C 6.8 (H) 05/28/2013   HGBA1C 10.5 (H) 01/27/2012   HGBA1C 9.9 (H) 10/01/2011   HGBA1C 9.9 (H) 05/23/2011  05/19/2018: HbA1c 11% 04/01/2017: HbA1c 11.2%  Previously on: - Janumet 50-1000 mg 2x a day, with meals - Jardiance 10 mg before breakfast He tried Glimepiride in the past.  We changed to: - Jardiance 10 mg before b'fast - Metformin 1000 mg 2x a day with meals - Trulicity 5.88 >> 1.5 mg weekly   He checks sugars 0-1x a day: - am: 306 >> 98, 100-150, 210 >> 180-250 - 2h after b'fast: n/c - before lunch: n/c - 2h after  lunch: n/c - before dinner: n/c - 2h after dinner: n/c - bedtime: n/c - nighttime: n/c Lowest sugar was 90 >> 98 >> 130; he has hypoglycemia awareness in the 90s. Highest sugar was 502 >> 774 (off Trulicity) >> 128.  Glucometer: One Touch ultra >> Freestyle  Pt's meals are: - Breakfast: skip - Lunch: lean cuisine, fast food - Dinner: same - Snacks: protein bars, PB crackers, fruit Stopped regular sodas before last visit.  He had labs by PCP 12/2019 >> will need to ask for them from PCP.  -+ Mild CKD, last BUN/creatinine:  Lab Results  Component Value Date   BUN 23 (H) 11/09/2018   BUN 19 04/23/2016   CREATININE 1.09 11/09/2018   CREATININE 1.29 (H) 04/23/2016  05/19/2018: 24/1.07, GFR 74, glucose 306, otherwise normal CMP On lisinopril 20.  -+ HL; last set of lipids: 05/19/2018: 368/1181/33/not calculated Lab Results  Component Value Date   CHOL 151 11/03/2015   HDL 37 (L) 11/03/2015   LDLCALC 48 11/03/2015   LDLDIRECT 92.0 02/06/2015   TRIG 330 (H) 11/03/2015   CHOLHDL 4.1 11/03/2015  On Zocor 40, Vascepa added 04/2018  - last eye exam was in 2021: No DR reportedly. [facility].  -+ Numbness and tingling in his feet.  On Neurontin 600 mg twice a day.  Pt has FH of DM in father.  She also has a history of HTN, GERD, OSA, obesity class  III, kidney stones.  He has a family history of bile duct cancer in mother.  ROS: Constitutional: + weight gain/no weight loss, no fatigue, no subjective hyperthermia, no subjective hypothermia Eyes: no blurry vision, no xerophthalmia ENT: no sore throat, no nodules palpated in neck, no dysphagia, no odynophagia, no hoarseness Cardiovascular: no CP/no SOB/no palpitations/no leg swelling Respiratory: no cough/no SOB/no wheezing Gastrointestinal: no N/no V/no D/no C/+ acid reflux Musculoskeletal: no muscle aches/no joint aches Skin: no rashes, no hair loss Neurological: no tremors/+ numbness/+ tingling/no dizziness  I  reviewed pt's medications, allergies, PMH, social hx, family hx, and changes were documented in the history of present illness. Otherwise, unchanged from my initial visit note.  Past Medical History:  Diagnosis Date  . Anxiety   . Complication of anesthesia   . Constipation   . Depression   . Diabetes mellitus   . GERD (gastroesophageal reflux disease)   . Head injury, acute, without loss of consciousness   . History of kidney stones   . Hyperlipidemia   . Hypertension   . Kidney stone   . PONV (postoperative nausea and vomiting)    post wisdom teeth  . Shortness of breath    with exertion  . Sinus disorder   . Sleep apnea    Past Surgical History:  Procedure Laterality Date  . DENTAL SURGERY    . INCISION AND DRAINAGE ABSCESS Left 11/12/2013   Procedure: INCISION AND DRAINAGE LEFT CHEEK  ABSCESS;  Surgeon: Victor Quitter, MD;  Location: [place];  Service: ENT;  Laterality: Left;  . NASAL SEPTOPLASTY W/ TURBINOPLASTY  04/26/2016  . NASAL SEPTOPLASTY W/ TURBINOPLASTY N/A 04/26/2016   Procedure: NASAL SEPTOPLASTY WITH TURBINATE REDUCTION;  Surgeon: Victor Quitter, MD;  Location: [place];  Service: ENT;  Laterality: N/A;  . WISDOM TOOTH EXTRACTION     Social History   Socioeconomic History  . Marital status: Married  Occupational History  Social Needs  . Financial resource strain: Not on file  . Food insecurity:    Worry: Not on file    Inability: Not on file  . Transportation needs:    Medical: Not on file    Non-medical: Not on file  Tobacco Use  . Smoking status: Never Smoker  . Smokeless tobacco: Never Used  Substance and Sexual Activity  . Alcohol use: No  . Drug use: No  . Sexual activity: Not on file  Lifestyle  . Physical activity:    Days per week: Not on file    Minutes per session: Not on file  . Stress: Not on file  Relationships  . Social connections:    Talks on phone: Not on file    Gets together: Not on file    Attends religious service: Not on file     Active member of club or organization: Not on file    Attends meetings of clubs or organizations: Not on file    Relationship status: Not on file  . Intimate partner violence:    Fear of current or ex partner: Not on file    Emotionally abused: Not on file    Physically abused: Not on file    Forced sexual activity: Not on file  Other Topics Concern  . 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 acetaminophen (TYLENOL) 500 MG tablet Take  1,000 mg by mouth every 6 (six) hours as needed (for pain).     Marland Kitchen aspirin EC 81 MG tablet Take 162 mg by mouth daily. (Patient not taking: Reported on 11/19/2019)    . buPROPion (WELLBUTRIN XL) 300 MG 24 hr tablet Take 300 mg by mouth daily. (Patient not taking: Reported on 11/19/2019)    . cephALEXin (KEFLEX) 500 MG capsule Take 1 capsule (500 mg total) by mouth 4 (four) times daily. (Patient not taking: Reported on 11/19/2019) 20 capsule 0  . clindamycin (CLEOCIN T) 1 % lotion Apply topically 2 (two) times daily.    Marland Kitchen doxycycline (MONODOX) 100 MG capsule Take 100 mg by mouth 2 (two) times daily.    . empagliflozin (JARDIANCE) 10 MG TABS tablet Take 10 mg by mouth daily. 30 tablet 6  . fexofenadine (ALLEGRA) 180 MG tablet Take by mouth.    . gabapentin (NEURONTIN) 600 MG tablet Take 1.5 tablets (900 mg total) by mouth 3 (three) times daily. 135 tablet 3  . ibuprofen (ADVIL) 200 MG tablet Take 200 mg by mouth every 6 (six) hours as needed.    Marland Kitchen lisinopril (PRINIVIL,ZESTRIL) 20 MG tablet TAKE 1 TABLET (20 MG TOTAL) BY MOUTH DAILY. 90 tablet 0  . metFORMIN (GLUCOPHAGE) 1000 MG tablet Take 1 tablet (1,000 mg total) by mouth 2 (two) times daily with a meal. Take 1 tablet twice a day with a meal. NO FURTHER REFILLS WITHOUT APPOINTMENT 60 tablet 3  . Multiple Vitamin (MULTIVITAMIN) tablet Take 1 tablet by mouth  daily.    . pantoprazole (PROTONIX) 20 MG tablet Take 1 tablet by mouth daily.  3  . simvastatin (ZOCOR) 20 MG tablet TAKE 1 TABLET BY MOUTH DAILY EVERY EVENING 30 tablet 6  . sitaGLIPtin-metformin (JANUMET) 50-1000 MG tablet TAKE 1 TABLET BY MOUTH 2 (TWO) TIMES DAILY WITH A MEAL. (Patient not taking: Reported on 11/19/2019) 60 tablet 6  . TRINTELLIX 20 MG TABS Take 1 tablet by mouth daily. (Patient not taking: Reported on 2/87/8676)  0  . TRULICITY 1.5 HM/0.9OB SOPN INJECT 1.5 MG INTO THE SKIN ONCE A WEEK. 2 mL 0  . VASCEPA 1 g CAPS      No current facility-administered medications on file prior to visit.   No Known Allergies Family History  Problem Relation Age of Onset  . Hyperlipidemia Father   . Heart disease Father   . Diabetes Father   . Heart disease Maternal Grandmother   . Diabetes Paternal Grandmother   . Cancer Mother     PE: There were no vitals taken for this visit. Wt Readings from Last 3 Encounters:  11/19/19 (!) 303 lb (137.4 kg)  11/09/18 287 lb (130.2 kg)  06/26/18 294 lb (133.4 kg)   Constitutional:  in NAD  The physical exam was not performed (virtual visit).  ASSESSMENT: 1. DM2, non-insulin-dependent, uncontrolled, with long-term complications -PN -Mild CKD  2. HL  3. Obesity class III  PLAN:  1. Patient with longstanding, uncontrolled, type 2 diabetes, on oral antidiabetic regimen with Metformin and SGLT2 inhibitor, along with GLP-1 receptor agonist, returning after long absence.  Our last appointment was also virtual, so we could not check an HbA1c.  However, his latest HbA1c was obtained 1 month ago and this was still quite high, and actually worse than the previous, at 10.5%. -At last visit, she was not taking Trulicity consistently and occasionally was forgetting the rest of his diabetic medicines.  We discussed about the importance of taking these consistently  and we also increased Trulicity to 1.5 mg weekly.  I advised him to let me know if the  sugars do not improve.  However, afterwards he was lost for follow-up for 1 year. -At today's visit, he is still only checking sugars in the morning and they are very high. We discussed about the importance of checking sugars later in the day, also, rotating check times. She does mention that he forgot his doses of Trulicity but he is not taking consistently for the last month. However, since his sugars are so high, we need to add basal insulin. We will try to start [place]. I advised him about titrating the dose based on blood sugars and advised him to let me know if sugars do not improve to target after he increases the dose to 36 units. In that case, we may need to increase Trulicity or even add mealtime insulin. Discussed about possible weight gain from insulin. We will continue the rest of the medicines for now. - I suggested to:  Patient Instructions  Please continue: - Metformin 1000 mg 2x a day with meals - Jardiance 10 mg before b'fast - Trulicity 1.5 mg weekly  Please start: - Tresiba 14 units daily. Increase the dose by 2-4 units every 3 days - until sugars in am <130 or if you reach 36 units.  Please return in 3 months with your sugar log.   - advised to check sugars at different times of the day - 1-2x a day, rotating check times - advised for yearly eye exams >> he is UTD - return to clinic in 3 months, but advised him to get in touch with me if sugars did not improve after starting insulin  2. HL -Reviewed latest lipid panel in 2020: LDL was not calculated, triglycerides high, HDL low -Continues Zocor 40 and Vascepa 2 g twice a day without side effects  3. Obesity class III -Before last visit, he lost approximately 5 pounds, but he gained approximately 15 pounds in the last year per review of recorded weights -continue SGLT 2 inhibitor and GLP-1 receptor agonist which should also help with weight loss, however, unfortunately, we need to start insulin which is weight  inducing  Philemon Kingdom, MD PhD [facility]

## 2020-01-28 NOTE — Patient Instructions (Addendum)
Please continue: - Metformin 1000 mg 2x a day with meals - Jardiance 10 mg before b'fast - Trulicity 1.5 mg weekly  Please start: - Tresiba 14 units daily. Increase the dose by 2-4 units every 3 days - until sugars in am <130 or if you reach 36 units.  Please return in 3 months with your sugar log.

## 2020-02-02 ENCOUNTER — Other Ambulatory Visit: Payer: Self-pay

## 2020-02-02 MED ORDER — DEXCOM G6 TRANSMITTER MISC: 3 refills | Status: DC

## 2020-02-02 MED ORDER — DEXCOM G6 SENSOR MISC: 3 refills | Status: DC

## 2020-02-02 MED ORDER — DEXCOM G6 RECEIVER DEVI: 0 refills | Status: DC

## 2020-02-04 ENCOUNTER — Other Ambulatory Visit: Payer: Self-pay | Admitting: Internal Medicine

## 2020-02-07 ENCOUNTER — Other Ambulatory Visit: Payer: Self-pay | Admitting: Internal Medicine

## 2020-02-07 MED FILL — TRULICITY 1.5 MG/0.5 ML PEN: 1.5 | 28 days supply | Qty: 2 | Fill #0

## 2020-02-16 ENCOUNTER — Ambulatory Visit: Payer: 59

## 2020-02-23 ENCOUNTER — Other Ambulatory Visit: Payer: Self-pay | Admitting: Internal Medicine

## 2020-02-23 ENCOUNTER — Encounter: Payer: Self-pay | Admitting: Internal Medicine

## 2020-02-23 DIAGNOSIS — E1142 Type 2 diabetes mellitus with diabetic polyneuropathy: Secondary | ICD-10-CM

## 2020-02-23 DIAGNOSIS — E1165 Type 2 diabetes mellitus with hyperglycemia: Secondary | ICD-10-CM

## 2020-02-23 MED FILL — VASCEPA 1 GM CAPSULE: 1 | 30 days supply | Qty: 120 | Fill #1

## 2020-02-23 MED FILL — GABAPENTIN 600 MG TABLET: 600 | 30 days supply | Qty: 135 | Fill #3

## 2020-02-23 MED FILL — METFORMIN HCL 1000 MG TABS: 1000 | 30 days supply | Qty: 60 | Fill #0

## 2020-02-25 MED FILL — PANTOPRAZOLE SOD DR 40 MG T: 40 | 90 days supply | Qty: 90 | Fill #0

## 2020-03-03 ENCOUNTER — Telehealth: Payer: Self-pay | Admitting: Internal Medicine

## 2020-03-03 NOTE — Telephone Encounter (Signed)
[facility] to check on a piror authorization freestyle and the sensor.   [phone]

## 2020-03-03 NOTE — Telephone Encounter (Signed)
Outbound call to patient advising prior authorization for Dexcom was sent for review. Awaiting for a determination.

## 2020-03-06 ENCOUNTER — Other Ambulatory Visit: Payer: Self-pay | Admitting: Internal Medicine

## 2020-03-06 MED ORDER — DEXCOM G6 SENSOR MISC: 3 refills | Status: DC

## 2020-03-06 MED ORDER — DEXCOM G6 RECEIVER DEVI: 0 refills | Status: DC

## 2020-03-06 MED ORDER — DEXCOM G6 TRANSMITTER MISC: 3 refills | Status: DC

## 2020-03-06 NOTE — Telephone Encounter (Signed)
Patient called to advise that he received a letter that the PA for the Dexcom has been approved. Is requesting the Dexcom  prescriptions need to be sent to the [facility] at [facility] instead of [place]. He is on the [employer] and the out patient pharmacy is more cost effective.  Any or cliarification [phone]

## 2020-03-13 MED FILL — DEXCOM G6 TRANSMITTER MISC: 90 days supply | Qty: 1 | Fill #0

## 2020-03-13 MED FILL — DEXCOM G6 SENSOR MISC: 30 days supply | Qty: 3 | Fill #0

## 2020-03-13 MED FILL — DEXCOM G6 RECEIVER DEVI: 30 days supply | Qty: 1 | Fill #0

## 2020-03-23 MED FILL — JARDIANCE 10 MG TABLET: 10 | 90 days supply | Qty: 90 | Fill #0

## 2020-03-23 MED FILL — VASCEPA 1 GM CAPSULE: 1 | 30 days supply | Qty: 120 | Fill #2

## 2020-03-23 MED FILL — METFORMIN HCL 1000 MG TABS: 1000 | 30 days supply | Qty: 60 | Fill #1

## 2020-03-23 MED FILL — LISINOPRIL 20 MG TABS: 20 | 90 days supply | Qty: 90 | Fill #1

## 2020-03-23 MED FILL — SIMVASTATIN 40 MG TABS: 40 | 90 days supply | Qty: 90 | Fill #1

## 2020-03-27 ENCOUNTER — Other Ambulatory Visit: Payer: Self-pay | Admitting: Neurology

## 2020-03-27 ENCOUNTER — Ambulatory Visit: Payer: 59 | Admitting: Neurology

## 2020-03-27 MED FILL — GABAPENTIN 600 MG TABLET: 600 | 30 days supply | Qty: 135 | Fill #0

## 2020-04-17 ENCOUNTER — Other Ambulatory Visit: Payer: Self-pay | Admitting: Internal Medicine

## 2020-04-17 MED FILL — TRULICITY 1.5 MG/0.5 ML PEN: 1.5 | 28 days supply | Qty: 2 | Fill #0

## 2020-04-20 ENCOUNTER — Ambulatory Visit: Payer: 59 | Admitting: Neurology

## 2020-04-24 MED FILL — METFORMIN HCL 1000 MG TABS: 1000 | 30 days supply | Qty: 60 | Fill #2

## 2020-04-24 MED FILL — VASCEPA 1 GM CAPSULE: 1 | 30 days supply | Qty: 120 | Fill #3

## 2020-04-27 ENCOUNTER — Other Ambulatory Visit (HOSPITAL_BASED_OUTPATIENT_CLINIC_OR_DEPARTMENT_OTHER): Payer: Self-pay | Admitting: Family Medicine

## 2020-04-27 MED FILL — GABAPENTIN 600 MG TABLET: 600 | 30 days supply | Qty: 135 | Fill #0

## 2020-05-16 MED FILL — DEXCOM G6 SENSOR MISC: 30 days supply | Qty: 3 | Fill #1

## 2020-05-22 MED FILL — GABAPENTIN 600 MG TABLET: 600 | 30 days supply | Qty: 135 | Fill #0

## 2020-05-26 MED FILL — METFORMIN HCL 1000 MG TABS: 1000 | 30 days supply | Qty: 60 | Fill #3

## 2020-05-26 MED FILL — PANTOPRAZOLE SOD DR 40 MG T: 40 | 90 days supply | Qty: 90 | Fill #1

## 2020-05-26 MED FILL — VASCEPA 1 GM CAPSULE: 1 | 30 days supply | Qty: 120 | Fill #4

## 2020-05-26 MED FILL — TRESIBA FLEXTOUCH 200 UNITS: 200 | 85 days supply | Qty: 6 | Fill #1

## 2020-06-05 ENCOUNTER — Other Ambulatory Visit: Payer: Self-pay | Admitting: Internal Medicine

## 2020-06-23 ENCOUNTER — Other Ambulatory Visit (HOSPITAL_BASED_OUTPATIENT_CLINIC_OR_DEPARTMENT_OTHER): Payer: Self-pay | Admitting: Family Medicine

## 2020-06-23 MED FILL — JARDIANCE 10 MG TABLET: 10 | 90 days supply | Qty: 90 | Fill #0

## 2020-06-23 MED FILL — VASCEPA 1 GM CAPSULE: 1 | 30 days supply | Qty: 120 | Fill #5

## 2020-06-27 ENCOUNTER — Other Ambulatory Visit (HOSPITAL_BASED_OUTPATIENT_CLINIC_OR_DEPARTMENT_OTHER): Payer: Self-pay

## 2020-06-27 ENCOUNTER — Other Ambulatory Visit: Payer: Self-pay | Admitting: Internal Medicine

## 2020-06-27 ENCOUNTER — Other Ambulatory Visit: Payer: Self-pay | Admitting: Neurology

## 2020-06-27 DIAGNOSIS — E1165 Type 2 diabetes mellitus with hyperglycemia: Secondary | ICD-10-CM

## 2020-06-27 DIAGNOSIS — E1142 Type 2 diabetes mellitus with diabetic polyneuropathy: Secondary | ICD-10-CM

## 2020-06-28 ENCOUNTER — Encounter: Payer: Self-pay | Admitting: *Deleted

## 2020-06-28 ENCOUNTER — Other Ambulatory Visit (HOSPITAL_BASED_OUTPATIENT_CLINIC_OR_DEPARTMENT_OTHER): Payer: Self-pay

## 2020-06-29 ENCOUNTER — Other Ambulatory Visit (HOSPITAL_BASED_OUTPATIENT_CLINIC_OR_DEPARTMENT_OTHER): Payer: Self-pay

## 2020-06-29 ENCOUNTER — Telehealth: Payer: Self-pay

## 2020-06-29 DIAGNOSIS — E1142 Type 2 diabetes mellitus with diabetic polyneuropathy: Secondary | ICD-10-CM

## 2020-06-29 MED ORDER — GABAPENTIN 600 MG PO TABS
ORAL_TABLET | ORAL | 0 refills | Status: DC
  Filled 2020-06-29: qty 135, 30d supply, fill #0

## 2020-06-29 NOTE — Telephone Encounter (Signed)
Requesting refill on Rx  metFORMIN (GLUCOPHAGE) 1000 MG tablet  [facility]

## 2020-06-30 ENCOUNTER — Other Ambulatory Visit (HOSPITAL_BASED_OUTPATIENT_CLINIC_OR_DEPARTMENT_OTHER): Payer: Self-pay

## 2020-06-30 DIAGNOSIS — F320 Major depressive disorder, single episode, mild: Secondary | ICD-10-CM | POA: Diagnosis not present

## 2020-06-30 DIAGNOSIS — G629 Polyneuropathy, unspecified: Secondary | ICD-10-CM | POA: Diagnosis not present

## 2020-06-30 DIAGNOSIS — E7800 Pure hypercholesterolemia, unspecified: Secondary | ICD-10-CM | POA: Diagnosis not present

## 2020-06-30 DIAGNOSIS — K219 Gastro-esophageal reflux disease without esophagitis: Secondary | ICD-10-CM | POA: Diagnosis not present

## 2020-06-30 DIAGNOSIS — I10 Essential (primary) hypertension: Secondary | ICD-10-CM | POA: Diagnosis not present

## 2020-06-30 DIAGNOSIS — G4733 Obstructive sleep apnea (adult) (pediatric): Secondary | ICD-10-CM | POA: Diagnosis not present

## 2020-06-30 DIAGNOSIS — E1169 Type 2 diabetes mellitus with other specified complication: Secondary | ICD-10-CM | POA: Diagnosis not present

## 2020-06-30 DIAGNOSIS — Z794 Long term (current) use of insulin: Secondary | ICD-10-CM | POA: Diagnosis not present

## 2020-06-30 MED ORDER — LISINOPRIL 20 MG PO TABS
ORAL_TABLET | ORAL | 1 refills | Status: DC
  Filled 2020-06-30: qty 90, 90d supply, fill #0

## 2020-06-30 MED ORDER — PANTOPRAZOLE SODIUM 40 MG PO TBEC
DELAYED_RELEASE_TABLET | ORAL | 1 refills | Status: DC
  Filled 2020-06-30: qty 90, 90d supply, fill #0

## 2020-06-30 MED ORDER — SIMVASTATIN 40 MG PO TABS
40.0000 mg | ORAL_TABLET | Freq: Every evening | ORAL | 1 refills | Status: DC
  Filled 2020-06-30: qty 90, 90d supply, fill #0

## 2020-06-30 MED ORDER — METFORMIN HCL 1000 MG PO TABS
1000.0000 mg | ORAL_TABLET | Freq: Two times a day (BID) | ORAL | 0 refills | Status: DC
Start: 2020-06-30 — End: 2020-09-28
  Filled 2020-06-30: qty 180, 90d supply, fill #0

## 2020-06-30 MED ORDER — ICOSAPENT ETHYL 1 G PO CAPS
ORAL_CAPSULE | ORAL | 5 refills | Status: DC
  Filled 2020-06-30 – 2020-07-27 (×2): qty 120, 30d supply, fill #0
  Filled 2020-08-29: qty 120, 30d supply, fill #1
  Filled 2020-09-28: qty 120, 30d supply, fill #2
  Filled 2020-10-30: qty 120, 30d supply, fill #3
  Filled 2020-11-30: qty 120, 30d supply, fill #4
  Filled 2020-12-28: qty 120, 30d supply, fill #5

## 2020-06-30 NOTE — Telephone Encounter (Signed)
Rx sent to preferred pharmacy.

## 2020-07-04 ENCOUNTER — Ambulatory Visit: Payer: 59 | Admitting: Diagnostic Neuroimaging

## 2020-07-04 ENCOUNTER — Other Ambulatory Visit: Payer: Self-pay

## 2020-07-04 ENCOUNTER — Encounter: Payer: Self-pay | Admitting: Diagnostic Neuroimaging

## 2020-07-04 VITALS — BP 113/73 | HR 83 | Ht 70.0 in | Wt 300.0 lb

## 2020-07-04 DIAGNOSIS — E1142 Type 2 diabetes mellitus with diabetic polyneuropathy: Secondary | ICD-10-CM

## 2020-07-04 NOTE — Patient Instructions (Signed)
  Painful neuropathy treatment options: - consider duloxetine 30-60mg  daily - continue gabapentin 900mg  three times a day  - trial of capsaicin cream, lidocaine patch / cream, alpha-lipoic acid 600mg  daily

## 2020-07-04 NOTE — Progress Notes (Signed)
[facility]  PATIENT: Victor Estrada DOB: 1971-02-12  REFERRING CLINICIAN: Antony Contras, MD HISTORY FROM: patient  REASON FOR VISIT: new consult    HISTORICAL  CHIEF COMPLAINT:  Chief Complaint  Patient presents with  . Diabetic peripheral neuropathy    Rm 6 New Pt  "constant neuropathy in feet: numbness, burning, pain, tingling; worse at night if I lay on my left side"    HISTORY OF PRESENT ILLNESS:   50 year old male here evaluation of diabetic neuropathy.  Patient had onset of diabetes around 2005.  He has had difficulty controlled diabetes with A1c's ranging from 9-13 over the past 15 years.  In 2019 patient had onset of numbness, sensitivity, pain in the feet.  He was diagnosed with diabetic neuropathy.  This happened around the time when he stopped his depression medications as well.  Recently he has continued to have burning, painful sensation in his feet and toes.  He has been using gabapentin ibuprofen 3 times a day with mild relief.  Also has intermittent numbness and tingling in his thighs, worse when he is laying on his left side.  No significant low back pain.    REVIEW OF SYSTEMS: Full 14 system review of systems performed and negative with exception of: As per HPI.  ALLERGIES: No Known Allergies  HOME MEDICATIONS: Outpatient Medications Prior to Visit  Medication Sig Dispense Refill  . acetaminophen (TYLENOL) 500 MG tablet Take 1,000 mg by mouth every 6 (six) hours as needed (for pain).     . clindamycin (CLEOCIN T) 1 % lotion Apply topically 2 (two) times daily.    . Continuous Blood Gluc Receiver (DEXCOM G6 RECEIVER) DEVI USE TO CHECK SUGAR 3- 4 TIMES DAILY 1 each 0  . doxycycline (MONODOX) 100 MG capsule Take 100 mg by mouth 2 (two) times daily.    . Dulaglutide 1.5 MG/0.5ML SOPN INJECT 1 SYRINGE (1.5 MG) INTO THE SKIN ONCE A WEEK **NEED APPT FOR FURTHER REFILLS) 2 mL 0  . empagliflozin (JARDIANCE) 10 MG TABS tablet TAKE 1 TABLET BY  MOUTH ONCE DAILY EVERY MORNING 90 tablet 0  . fexofenadine (ALLEGRA) 180 MG tablet Take by mouth.    . gabapentin (NEURONTIN) 600 MG tablet Take 1 and 1/2 tablets by mouth every 8 hours 135 tablet 0  . ibuprofen (ADVIL) 200 MG tablet Take 200 mg by mouth every 6 (six) hours as needed.    Marland Kitchen icosapent Ethyl (VASCEPA) 1 g capsule TAKE 2 CAPSULES BY MOUTH TWICE DAILY WITH MEALS 120 capsule 5  . insulin degludec (TRESIBA) 200 UNIT/ML FlexTouch Pen INJECT 14 UNITS INTO THE SKIN DAILY. 9 mL 3  . Insulin Pen Needle 32G X 4 MM MISC USE ONCE A DAY AS DIRECTED 100 each 3  . lisinopril (ZESTRIL) 20 MG tablet TAKE 1 TABLET BY MOUTH ONCE DAILY 90 tablet 1  . metFORMIN (GLUCOPHAGE) 1000 MG tablet Take 1 tablet (1,000 mg total) by mouth 2 (two) times daily with a meal. 180 tablet 0  . Multiple Vitamin (MULTIVITAMIN) tablet Take 1 tablet by mouth daily.    . pantoprazole (PROTONIX) 40 MG tablet TAKE 1 TABLET BY MOUTH ONCE DAILY 90 tablet 1  . simvastatin (ZOCOR) 40 MG tablet TAKE 1 TABLET BY MOUTH ONCE DAILY 90 tablet 1  . gabapentin (NEURONTIN) 600 MG tablet TAKE ONE AND ONE-HALF TABLET BY MOUTH EVERY 8 HOURS 135 tablet 0  . Continuous Blood Gluc Sensor (DEXCOM G6 SENSOR) MISC USE TO CHECK SUGAR 3-4 TIMES DAILY  9 each 3  . Continuous Blood Gluc Transmit (DEXCOM G6 TRANSMITTER) MISC USE TO CHECK SUGAR 3 - 4 TIMES DAILY 1 each 3  . empagliflozin (JARDIANCE) 10 MG TABS tablet Take 10 mg by mouth daily. 30 tablet 6  . empagliflozin (JARDIANCE) 10 MG TABS tablet TAKE 1 TABLET BY MOUTH ONCE DAILY EVERY MORNING OR WITHOUT FOOD 90 tablet 0  . gabapentin (NEURONTIN) 600 MG tablet TAKE 1 AND 1/2 TABLETS BY MOUTH 3 TIMES DAILY 135 tablet 0  . gabapentin (NEURONTIN) 600 MG tablet TAKE 1 AND 1/2 TABLETS BY MOUTH EVERY 8 HOURS 135 tablet 0  . icosapent Ethyl (VASCEPA) 1 g capsule TAKE 2 CAPSULES BY MOUTH TWICE DAILY WITH MEALS 120 capsule 5  . lisinopril (PRINIVIL,ZESTRIL) 20 MG tablet TAKE 1 TABLET (20 MG TOTAL) BY MOUTH  DAILY. 90 tablet 0  . lisinopril (ZESTRIL) 20 MG tablet TAKE 1 TABLET BY MOUTH DAILY 90 tablet 1  . pantoprazole (PROTONIX) 20 MG tablet Take 1 tablet by mouth daily.  3  . pantoprazole (PROTONIX) 40 MG tablet TAKE 1 TABLET BY MOUTH DAILY 90 tablet 1  . simvastatin (ZOCOR) 20 MG tablet TAKE 1 TABLET BY MOUTH DAILY EVERY EVENING 30 tablet 6  . simvastatin (ZOCOR) 40 MG tablet take 1 tablet by mouth once daily in the evening 90 tablet 1  . VASCEPA 1 g CAPS      No facility-administered medications prior to visit.    PAST MEDICAL HISTORY: Past Medical History:  Diagnosis Date  . Anxiety   . Complication of anesthesia   . Constipation   . Depression   . Diabetes mellitus    type 2  . GERD (gastroesophageal reflux disease)   . Head injury, acute, without loss of consciousness   . History of kidney stones   . Hyperlipidemia   . Hypertension   . Kidney stone   . Peripheral neuropathy   . PONV (postoperative nausea and vomiting)    post wisdom teeth  . Shortness of breath    with exertion  . Sinus disorder   . Sleep apnea     PAST SURGICAL HISTORY: Past Surgical History:  Procedure Laterality Date  . DENTAL SURGERY    . INCISION AND DRAINAGE ABSCESS Left 11/12/2013   Procedure: INCISION AND DRAINAGE LEFT CHEEK  ABSCESS;  Surgeon: Melida Quitter, MD;  Location: [place];  Service: ENT;  Laterality: Left;  . NASAL SEPTOPLASTY W/ TURBINOPLASTY N/A 04/26/2016   Procedure: NASAL SEPTOPLASTY WITH TURBINATE REDUCTION;  Surgeon: Melida Quitter, MD;  Location: [place];  Service: ENT;  Laterality: N/A;  . WISDOM TOOTH EXTRACTION      FAMILY HISTORY: Family History  Problem Relation Age of Onset  . Hyperlipidemia Father   . Heart disease Father   . Diabetes Father   . Heart disease Maternal Grandmother   . Diabetes Paternal Grandmother   . Cancer Mother     SOCIAL HISTORY: Social History   Socioeconomic History  . Marital status: Married    Spouse name: Tammy  . Number of children: 1   . Years of education: Not on file  . Highest education level: Some college, no degree  Occupational History    Comment: Dance movement psychotherapist  Tobacco Use  . Smoking status: Never Smoker  . Smokeless tobacco: Never Used  Vaping Use  . Vaping Use: Never used  Substance and Sexual Activity  . Alcohol use: No  . Drug use: No  . Sexual activity: Not on file  Other Topics Concern  . 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 Wt Readings from Last 3 Encounters:  07/04/20 300 lb (136.1 kg)  11/19/19 (!) 303 lb (137.4 kg)  11/09/18 287 lb (130.2 kg)     Patient is in no distress; well developed, nourished and groomed; neck is supple  CARDIOVASCULAR:  Examination of carotid arteries is normal; no carotid bruits  Regular rate and rhythm, no murmurs  Examination of peripheral vascular system by observation and palpation is normal  EYES:  Ophthalmoscopic exam of optic discs and posterior segments is normal; no papilledema or hemorrhages  No exam data present  MUSCULOSKELETAL:  Gait, strength, tone, movements noted in Neurologic exam below  NEUROLOGIC: MENTAL STATUS:  No flowsheet data found.  awake, alert, oriented to person, place and time  recent and remote memory intact  normal attention and concentration  language fluent, comprehension intact, naming intact  fund of knowledge  appropriate  CRANIAL NERVE:   2nd - no papilledema on fundoscopic exam  2nd, 3rd, 4th, 6th - pupils equal and reactive to light, visual fields full to confrontation, extraocular muscles intact, no nystagmus  5th - facial sensation symmetric  7th - facial strength symmetric  8th - hearing intact  9th - palate elevates symmetrically, uvula midline  11th - shoulder shrug symmetric  12th - tongue protrusion midline  MOTOR:   normal bulk and tone, full strength in the BUE, BLE  SENSORY:   normal and symmetric to light touch, pinprick, temperature, vibration  COORDINATION:   finger-nose-finger, fine finger movements normal  REFLEXES:   deep tendon reflexes present and symmetric  GAIT/STATION:   narrow based gait; able to walk on toes, heels and tandem; romberg is negative     DIAGNOSTIC DATA (LABS, IMAGING, TESTING) - I reviewed patient records, labs, notes, testing and imaging myself where available.  Lab Results  Component Value Date   WBC 10.8 (H) 11/09/2018   HGB 14.9 11/09/2018   HCT 45.0 11/09/2018   MCV 83.8 11/09/2018   PLT 228 11/09/2018      Component Value Date/Time   NA 134 (L) 11/09/2018 1629   K 4.0 11/09/2018 1629   CL 100 11/09/2018 1629   CO2 24 11/09/2018 1629   GLUCOSE 290 (H) 11/09/2018 1629   BUN 23 (H) 11/09/2018 1629   CREATININE 1.09 11/09/2018 1629   CREATININE 0.99 11/03/2015 1605   CALCIUM 8.7 (L) 11/09/2018 1629   PROT 6.6 11/03/2015 1605   ALBUMIN 4.0 11/03/2015 1605   AST 30 11/03/2015 1605   ALT 38 11/03/2015 1605   ALKPHOS 77 11/03/2015 1605   BILITOT 0.4 11/03/2015 1605   GFRNONAA >60 11/09/2018 1629   GFRAA >60 11/09/2018 1629   Lab Results  Component Value Date   CHOL 151 11/03/2015  HDL 37 (L) 11/03/2015   LDLCALC 48 11/03/2015   LDLDIRECT 92.0 02/06/2015   TRIG 330 (H) 11/03/2015   CHOLHDL 4.1 11/03/2015   Lab Results  Component Value Date   HGBA1C 10.5 12/30/2019   No results found for:  [id number] Lab Results  Component Value Date   TSH 1.30 11/03/2015       ASSESSMENT AND PLAN  50 y.o. year old male here with diabetic peripheral neuropathy.   Dx:  1. Diabetic polyneuropathy associated with type 2 diabetes mellitus (HCC)      PLAN:  Painful diabetic neuropathy treatment options: - consider duloxetine 30-60mg  daily - continue gabapentin 900mg  three times a day  - trial of capsaicin cream, lidocaine patch / cream, alpha-lipoic acid 600mg  daily  Return for return to PCP, pending if symptoms worsen or fail to improve.    Penni Bombard, MD 2/48/2500, 3:70 PM Certified in Neurology, Neurophysiology and Neuroimaging  [facility] [address] [place], [place] [address] [phone]

## 2020-07-21 ENCOUNTER — Other Ambulatory Visit (HOSPITAL_BASED_OUTPATIENT_CLINIC_OR_DEPARTMENT_OTHER): Payer: Self-pay

## 2020-07-21 MED FILL — Insulin Degludec Soln Pen-Injector 200 Unit/ML: SUBCUTANEOUS | 85 days supply | Qty: 6 | Fill #0 | Status: CN

## 2020-07-21 MED FILL — Insulin Pen Needle 32 G X 4 MM (1/6" or 5/32"): 90 days supply | Qty: 100 | Fill #0 | Status: AC

## 2020-07-25 ENCOUNTER — Other Ambulatory Visit: Payer: Self-pay

## 2020-07-25 ENCOUNTER — Other Ambulatory Visit (HOSPITAL_BASED_OUTPATIENT_CLINIC_OR_DEPARTMENT_OTHER): Payer: Self-pay

## 2020-07-25 ENCOUNTER — Telehealth: Payer: Self-pay | Admitting: Internal Medicine

## 2020-07-25 DIAGNOSIS — E1142 Type 2 diabetes mellitus with diabetic polyneuropathy: Secondary | ICD-10-CM

## 2020-07-25 DIAGNOSIS — E1165 Type 2 diabetes mellitus with hyperglycemia: Secondary | ICD-10-CM

## 2020-07-25 MED ORDER — INSULIN DEGLUDEC 200 UNIT/ML ~~LOC~~ SOPN
PEN_INJECTOR | SUBCUTANEOUS | 1 refills | Status: DC
  Filled 2020-07-25 – 2020-07-26 (×2): qty 9, 50d supply, fill #0
  Filled 2020-10-04: qty 9, 50d supply, fill #1

## 2020-07-25 MED FILL — Insulin Degludec Soln Pen-Injector 200 Unit/ML: SUBCUTANEOUS | 86 days supply | Qty: 6 | Fill #0 | Status: CN

## 2020-07-25 NOTE — Telephone Encounter (Signed)
Pt is needing a refill for insulin degludec (TRESIBA) 200 UNIT/ML FlexTouch Pen  Pt got it filled in march and pt has been using more units. Pt does not have any left.   [facility]

## 2020-07-25 NOTE — Telephone Encounter (Signed)
Rx sent to preferred pharmacy with update pt sig.

## 2020-07-26 ENCOUNTER — Other Ambulatory Visit (HOSPITAL_BASED_OUTPATIENT_CLINIC_OR_DEPARTMENT_OTHER): Payer: Self-pay

## 2020-07-27 ENCOUNTER — Other Ambulatory Visit (HOSPITAL_BASED_OUTPATIENT_CLINIC_OR_DEPARTMENT_OTHER): Payer: Self-pay

## 2020-07-27 ENCOUNTER — Other Ambulatory Visit: Payer: Self-pay | Admitting: Internal Medicine

## 2020-07-27 DIAGNOSIS — E1142 Type 2 diabetes mellitus with diabetic polyneuropathy: Secondary | ICD-10-CM

## 2020-07-27 DIAGNOSIS — E1165 Type 2 diabetes mellitus with hyperglycemia: Secondary | ICD-10-CM

## 2020-07-28 ENCOUNTER — Other Ambulatory Visit (HOSPITAL_BASED_OUTPATIENT_CLINIC_OR_DEPARTMENT_OTHER): Payer: Self-pay

## 2020-07-28 MED ORDER — GABAPENTIN 600 MG PO TABS
ORAL_TABLET | ORAL | 0 refills | Status: DC
  Filled 2020-07-28: qty 135, 90d supply, fill #0
  Filled 2020-08-01: qty 135, 30d supply, fill #0

## 2020-07-31 ENCOUNTER — Other Ambulatory Visit (HOSPITAL_BASED_OUTPATIENT_CLINIC_OR_DEPARTMENT_OTHER): Payer: Self-pay

## 2020-08-01 ENCOUNTER — Other Ambulatory Visit (HOSPITAL_BASED_OUTPATIENT_CLINIC_OR_DEPARTMENT_OTHER): Payer: Self-pay

## 2020-08-01 MED ORDER — TRULICITY 1.5 MG/0.5ML ~~LOC~~ SOAJ
SUBCUTANEOUS | 0 refills | Status: DC
  Filled 2020-08-01: qty 2, 28d supply, fill #0

## 2020-08-01 MED ORDER — DEXCOM G6 SENSOR MISC
3 refills | Status: AC
  Filled 2020-08-01: qty 9, 90d supply, fill #0

## 2020-08-01 MED ORDER — DEXCOM G6 TRANSMITTER MISC
3 refills | Status: AC
Start: 2020-08-01 — End: 2021-08-01
  Filled 2020-08-01: qty 1, 90d supply, fill #0

## 2020-08-02 ENCOUNTER — Other Ambulatory Visit (HOSPITAL_BASED_OUTPATIENT_CLINIC_OR_DEPARTMENT_OTHER): Payer: Self-pay

## 2020-08-11 ENCOUNTER — Encounter: Payer: Self-pay | Admitting: Internal Medicine

## 2020-08-11 ENCOUNTER — Other Ambulatory Visit (HOSPITAL_COMMUNITY): Payer: Self-pay

## 2020-08-11 ENCOUNTER — Ambulatory Visit: Payer: 59 | Admitting: Internal Medicine

## 2020-08-11 ENCOUNTER — Other Ambulatory Visit (HOSPITAL_BASED_OUTPATIENT_CLINIC_OR_DEPARTMENT_OTHER): Payer: Self-pay

## 2020-08-11 ENCOUNTER — Other Ambulatory Visit: Payer: Self-pay

## 2020-08-11 VITALS — BP 130/80 | HR 86 | Ht 70.0 in | Wt 299.6 lb

## 2020-08-11 DIAGNOSIS — E1165 Type 2 diabetes mellitus with hyperglycemia: Secondary | ICD-10-CM | POA: Diagnosis not present

## 2020-08-11 DIAGNOSIS — E1142 Type 2 diabetes mellitus with diabetic polyneuropathy: Secondary | ICD-10-CM | POA: Diagnosis not present

## 2020-08-11 DIAGNOSIS — E669 Obesity, unspecified: Secondary | ICD-10-CM | POA: Diagnosis not present

## 2020-08-11 DIAGNOSIS — E785 Hyperlipidemia, unspecified: Secondary | ICD-10-CM | POA: Diagnosis not present

## 2020-08-11 MED ORDER — TRULICITY 3 MG/0.5ML ~~LOC~~ SOAJ
3.0000 mg | SUBCUTANEOUS | 3 refills | Status: DC
  Filled 2020-08-11 – 2020-12-18 (×2): qty 6, 84d supply, fill #0

## 2020-08-11 NOTE — Progress Notes (Signed)
Patient ID: OSA CAMPOLI, male   DOB: [birth year], 50 y.o.   MRN: [id number]   This visit occurred during the SARS-CoV-2 public health emergency.  Safety protocols were in place, including screening questions prior to the visit, additional usage of staff PPE, and extensive cleaning of exam room while observing appropriate contact time as indicated for disinfecting solutions.   HPI: DELL BRINER is a 50 y.o.-year-old male, initially referred by his previous PCP, Dr. Moreen Fowler, returning for follow-up for DM2, dx in 2005, non-insulin-dependent, uncontrolled, with long-term complications (PN). His wife is also my pt: Tammy Lites. He saw Dr. Loanne Drilling in 2013.  Last visit 6 months ago (virtual).  Interim history: He started a Dexcom CGM >> he likes it. He feels sugars improved after he started this. At last visit, I advised him to start insulin.  He ended up starting it 3 months later, in 04/2020, due to his discomfort with needles.  Sugars improved afterwards.  Reviewed latest HbA1c level: 06/30/2020: HbA1c 7.7% Lab Results  Component Value Date   HGBA1C 10.5 12/30/2019   HGBA1C 9.8 04/13/2019   HGBA1C 11.1 (H) 11/03/2015   HGBA1C 10.5 (H) 02/06/2015   HGBA1C 6.6 (H) 09/14/2013   HGBA1C 6.8 (H) 05/28/2013   HGBA1C 10.5 (H) 01/27/2012   HGBA1C 9.9 (H) 10/01/2011   HGBA1C 9.9 (H) 05/23/2011  05/19/2018: HbA1c 11% 04/01/2017: HbA1c 11.2%  Previously on: - Janumet 50-1000 mg 2x a day, with meals - Jardiance 10 mg before breakfast He tried Glimepiride in the past.  We changed to: - Metformin 1000 mg 2x a day with meals - Jardiance 10 mg before b'fast - Trulicity 6.30 >> 1.5 mg weekly  - Tresiba 14 units (restarted 01/2020) >> 32 units  He checks sugars 4x a day with the CGM - forgot Receiver: - am: 306 >> 98, 100-150, 210 >> 180-250 >> 100-150, 180 - 2h after b'fast: n/c - before lunch: n/c >> see above - 2h after lunch: n/c >> 180-200 - before dinner: n/c - 2h after dinner: n/c >>  150-200, 250, 280 - bedtime: n/c - nighttime: n/c Lowest sugar was 90 >> 98 >> 130  >> 95; he has hypoglycemia awareness in the 90s. Highest sugar was 160 >> 109 (off Trulicity) >> 323 >> 557.  Glucometer: One Touch ultra >> Freestyle  Pt's meals are: - Breakfast: skip - Lunch: lean cuisine, fast food - Dinner: same - Snacks: protein bars, PB crackers, fruit Stopped regular sodas before last visit.  He had labs by PCP 12/2019 >> will need to ask for them from PCP.  -+ Mild CKD, last BUN/creatinine:  06/30/2020: 27/1.0, GFR 92, glucose 121 Lab Results  Component Value Date   BUN 23 (H) 11/09/2018   BUN 19 04/23/2016   CREATININE 1.09 11/09/2018   CREATININE 1.29 (H) 04/23/2016  05/19/2018: 24/1.07, GFR 74, glucose 306, otherwise normal CMP On lisinopril 20.  -+ HL; last set of lipids: 06/30/2020: 142/249/30/71 05/19/2018: 368/1181/33/not calculated Lab Results  Component Value Date   CHOL 151 11/03/2015   HDL 37 (L) 11/03/2015   LDLCALC 48 11/03/2015   LDLDIRECT 92.0 02/06/2015   TRIG 330 (H) 11/03/2015   CHOLHDL 4.1 11/03/2015  On Zocor 40, Vascepa added 04/2018  - last eye exam was in 2021: No DR reportedly. [facility].  -+ Numbness and tingling in his feet.  On Neurontin 600 >> 900 mg 2 >> 3x a day.  Pt has FH of DM in father.  She also has  a history of HTN, GERD, OSA, obesity class III, kidney stones.  He has a family history of bile duct cancer in mother.  ROS: Constitutional: no weight gain/no weight loss, no fatigue, no subjective hyperthermia, no subjective hypothermia Eyes: no blurry vision, no xerophthalmia ENT: no sore throat, no nodules palpated in neck, no dysphagia, no odynophagia, no hoarseness Cardiovascular: no CP/no SOB/no palpitations/no leg swelling Respiratory: no cough/no SOB/no wheezing Gastrointestinal: no N/no V/no D/no C/+ acid reflux Musculoskeletal: no muscle aches/no joint aches Skin: no rashes, no hair loss Neurological: no  tremors/+ numbness/+ tingling/no dizziness  I reviewed pt's medications, allergies, PMH, social hx, family hx, and changes were documented in the history of present illness. Otherwise, unchanged from my initial visit note.  Past Medical History:  Diagnosis Date  . Anxiety   . Complication of anesthesia   . Constipation   . Depression   . Diabetes mellitus    type 2  . GERD (gastroesophageal reflux disease)   . Head injury, acute, without loss of consciousness   . History of kidney stones   . Hyperlipidemia   . Hypertension   . Kidney stone   . Peripheral neuropathy   . PONV (postoperative nausea and vomiting)    post wisdom teeth  . Shortness of breath    with exertion  . Sinus disorder   . Sleep apnea    Past Surgical History:  Procedure Laterality Date  . DENTAL SURGERY    . INCISION AND DRAINAGE ABSCESS Left 11/12/2013   Procedure: INCISION AND DRAINAGE LEFT CHEEK  ABSCESS;  Surgeon: Melida Quitter, MD;  Location: [place];  Service: ENT;  Laterality: Left;  . NASAL SEPTOPLASTY W/ TURBINOPLASTY N/A 04/26/2016   Procedure: NASAL SEPTOPLASTY WITH TURBINATE REDUCTION;  Surgeon: Melida Quitter, MD;  Location: [place];  Service: ENT;  Laterality: N/A;  . WISDOM TOOTH EXTRACTION     Social History   Socioeconomic History  . Marital status: Married  Occupational History  Social Needs  . Financial resource strain: Not on file  . Food insecurity:    Worry: Not on file    Inability: Not on file  . Transportation needs:    Medical: Not on file    Non-medical: Not on file  Tobacco Use  . Smoking status: Never Smoker  . Smokeless tobacco: Never Used  Substance and Sexual Activity  . Alcohol use: No  . Drug use: No  . Sexual activity: Not on file  Lifestyle  . Physical activity:    Days per week: Not on file    Minutes per session: Not on file  . Stress: Not on file  Relationships  . Social connections:    Talks on phone: Not on file    Gets together: Not on file    Attends  religious service: Not on file    Active member of club or organization: Not on file    Attends meetings of clubs or organizations: Not on file    Relationship status: Not on file  . Intimate partner violence:    Fear of current or ex partner: Not on file    Emotionally abused: Not on file    Physically abused: Not on file    Forced sexual activity: Not on file  Other Topics Concern  . Not on file  Social History Narrative   Married 1 son   Dance movement psychotherapist   2-3 caffeine drinks/day   Current Outpatient Medications on File Prior to Visit  Medication Sig Dispense  Refill  . acetaminophen (TYLENOL) 500 MG tablet Take 1,000 mg by mouth every 6 (six) hours as needed (for pain).     . clindamycin (CLEOCIN T) 1 % lotion Apply topically 2 (two) times daily.    . Continuous Blood Gluc Receiver (DEXCOM G6 RECEIVER) DEVI USE TO CHECK SUGAR 3- 4 TIMES DAILY 1 each 0  . Continuous Blood Gluc Sensor (DEXCOM G6 SENSOR) MISC USE TO CHECK SUGAR 3-4 TIMES DAILY 9 each 3  . Continuous Blood Gluc Transmit (DEXCOM G6 TRANSMITTER) MISC USE TO CHECK SUGAR 3 - 4 TIMES DAILY 1 each 3  . doxycycline (MONODOX) 100 MG capsule Take 100 mg by mouth 2 (two) times daily.    . Dulaglutide (TRULICITY) 1.5 YQ/6.5HQ SOPN INJECT 1 SYRINGE (1.5 MG) INTO THE SKIN ONCE A WEEK **NEED APPT FOR FURTHER REFILLS) 2 mL 0  . empagliflozin (JARDIANCE) 10 MG TABS tablet TAKE 1 TABLET BY MOUTH ONCE DAILY EVERY MORNING 90 tablet 0  . fexofenadine (ALLEGRA) 180 MG tablet Take by mouth.    . gabapentin (NEURONTIN) 600 MG tablet Take 1 & 1/2 tablet by mouth every 8 hours 135 tablet 0  . ibuprofen (ADVIL) 200 MG tablet Take 200 mg by mouth every 6 (six) hours as needed.    Marland Kitchen icosapent Ethyl (VASCEPA) 1 g capsule TAKE 2 CAPSULES BY MOUTH TWICE DAILY WITH MEALS 120 capsule 5  . insulin degludec (TRESIBA) 200 UNIT/ML FlexTouch Pen Inject up to 36 units into the skin daily. 9 mL 1  . Insulin Pen Needle 32G X 4 MM MISC USE ONCE A DAY AS  DIRECTED 100 each 3  . lisinopril (ZESTRIL) 20 MG tablet TAKE 1 TABLET BY MOUTH ONCE DAILY 90 tablet 1  . metFORMIN (GLUCOPHAGE) 1000 MG tablet Take 1 tablet (1,000 mg total) by mouth 2 (two) times daily with a meal. 180 tablet 0  . Multiple Vitamin (MULTIVITAMIN) tablet Take 1 tablet by mouth daily.    . pantoprazole (PROTONIX) 40 MG tablet TAKE 1 TABLET BY MOUTH ONCE DAILY 90 tablet 1  . simvastatin (ZOCOR) 40 MG tablet TAKE 1 TABLET BY MOUTH ONCE DAILY 90 tablet 1   No current facility-administered medications on file prior to visit.   No Known Allergies Family History  Problem Relation Age of Onset  . Hyperlipidemia Father   . Heart disease Father   . Diabetes Father   . Heart disease Maternal Grandmother   . Diabetes Paternal Grandmother   . Cancer Mother    PE: BP 130/80 (BP Location: Right Arm, Patient Position: Sitting, Cuff Size: Normal)   Pulse 86   Ht 5\' 10"  (1.778 m)   Wt 299 lb 9.6 oz (135.9 kg)   SpO2 97%   BMI 42.99 kg/m  Wt Readings from Last 3 Encounters:  08/11/20 299 lb 9.6 oz (135.9 kg)  07/04/20 300 lb (136.1 kg)  11/19/19 (!) 303 lb (137.4 kg)   Constitutional: overweight, in NAD Eyes: PERRLA, EOMI, no exophthalmos ENT: moist mucous membranes, no thyromegaly, no cervical lymphadenopathy Cardiovascular: RRR, No MRG Respiratory: CTA B Gastrointestinal: abdomen soft, NT, ND, BS+ Musculoskeletal: no deformities, strength intact in all 4 Skin: moist, warm, no rashes Neurological: no tremor with outstretched hands, DTR normal in all 4  ASSESSMENT: 1. DM2, non-insulin-dependent, uncontrolled, with long-term complications -PN -Mild CKD  2. HL  3. Obesity class III  PLAN:  1. Patient with longstanding, uncontrolled, type 2 diabetes, on oral antidiabetic regimen with metformin and SGLT2 inhibitor along with weekly  GLP-1 receptor agonist, and off the insulin at last visit, when he returns after long absence.  At that time, he was only checking in the  morning and I advised him to start checking sugars later in the day, rotating check times.  He did mention that he forgot doses of Trulicity frequently and we discussed about the importance of taking this consistently, however, since the sugars are very high, we started [place] and I advised him how to adjust the dose up.  Previous HbA1c was quite high, at 10.5%, in 12/2019.  We could not check an HbA1c at last visit since this was a virtual visit.  However, he had a repeat HbA1c on 06/30/2020 that was better, at 7.7%. -At this visit, sugars improved, but they are still above target especially later in the day, as he admits for dietary indiscretions.  He mentions that his sugars could be as high as 250-280 after a larger meal.  At this visit, we discussed about increasing Trulicity to 3 mg weekly.  He tolerates this well, without nausea or other GI symptoms.  I explained that this will help with decreased appetite and cravings. - I suggested to:  Patient Instructions  Please continue: - Metformin 1000 mg 2x a day with meals - Jardiance 10 mg before b'fast - Tresiba 32 units daily  Please increase: - Trulicity 3 mg weekly  Please return in 3-4 months with your sugar log.   - advised to check sugars at different times of the day - 1-2x a day, rotating check times - advised for yearly eye exams >> he is UTD - return to clinic in 3-4 months  2. HL -Reviewed latest lipid panel from 06/2020: LDL above target, but triglycerides much improved, still above target.  HDL also low. -Continue Zocor 40 mg daily and Vascepa 2 g twice a day without side effects  3. Obesity class III -He gained 15 pounds before last visit -He lost approximately 4 pounds since our last visit -continue SGLT 2 inhibitor and GLP-1 receptor agonist which should also help with weight loss.  We will also try to increase Trulicity dose at this visit.  Philemon Kingdom, MD PhD [facility]

## 2020-08-11 NOTE — Patient Instructions (Addendum)
Please continue: - Metformin 1000 mg 2x a day with meals - Jardiance 10 mg before b'fast - Tresiba 32 units daily  Please increase: - Trulicity 3 mg weekly  Please return in 3-4 months with your sugar log.

## 2020-08-14 ENCOUNTER — Other Ambulatory Visit (HOSPITAL_BASED_OUTPATIENT_CLINIC_OR_DEPARTMENT_OTHER): Payer: Self-pay

## 2020-08-21 ENCOUNTER — Telehealth: Payer: 59 | Admitting: Physician Assistant

## 2020-08-21 DIAGNOSIS — J069 Acute upper respiratory infection, unspecified: Secondary | ICD-10-CM

## 2020-08-21 NOTE — Progress Notes (Signed)
Your current symptoms could be consistent with the coronavirus.  Many health care providers can now test patients at their office but not all are.  [place] has multiple testing sites. For information on our COVID testing locations and hours go to [email or web address]  Testing Information: The COVID-19 Community Testing sites are testing BY APPOINTMENT ONLY.  You can schedule online at [email or web address]  If you do not have access to a smart phone or computer you may call [phone] for an appointment.   Additional testing sites in the Community:  . For [facility] Testing sites in [facility]  [email or web address]  . For Pop-up testing sites in [place]  [email or web address]  . For [facility] [email or web address]  . For [facility] testing in [place] and [employer] [email or web address]  . For [facility] testing in [facility]   [email or web address]  For  more information about community testing call [phone]   Please quarantine yourself while awaiting your test results. Please stay home for a minimum of 10 days from the first day of illness with improving symptoms and you have had 24 hours of no fever (without the use of Tylenol (Acetaminophen) Motrin (Ibuprofen) or any fever reducing medication).  Also - Do not get tested prior to returning to work because once you have had a positive test the test can stay positive for more than a month in some cases.   You should wear a mask or cloth face covering over your nose and mouth if you must be around other people or animals, including pets (even at home). Try to stay at least 6 feet away from other  people. This will protect the people around you.  Please continue good preventive care measures, including:  frequent hand-washing, avoid touching your face, cover coughs/sneezes, stay out of crowds and keep a 6 foot distance from others.  COVID-19 is a respiratory illness with symptoms that are similar to the flu. Symptoms are typically mild to moderate, but there have been cases of severe illness and death due to the virus.   The following symptoms may appear 2-14 days after exposure: . Fever . Cough . Shortness of breath or difficulty breathing . Chills . Repeated shaking with chills . Muscle pain . Headache . Sore throat . New loss of taste or smell . Fatigue . Congestion or runny nose . Nausea or vomiting . Diarrhea  Go to the nearest hospital ED for assessment if fever/cough/breathlessness are severe or illness seems like a threat to life.  It is vitally important that if you feel that you have an infection such as this virus or any other virus that you stay home and away from places where you may spread it to others.  You should avoid contact with people age 51 and older.   You can use medication such as: Robitussin for diabetics, Deslym, or Mucinex DM over the counter You can use Chloraseptic spray OTC for sore throat.  Unfortunately, we are unable to prescribe medications outside of [place]. If you are having severe difficulty swallowing and unable to maintain swallowing your own saliva, you should seek immediate care in-person near you.   You may also take acetaminophen (Tylenol) as needed for fever.  Reduce your risk of any infection by using the same precautions used for avoiding the common cold or flu:  Marland Kitchen Wash your hands often with soap and warm water for at least 20 seconds.  If soap and water are not readily available, use an alcohol-based hand sanitizer  with at least 60% alcohol.  . If coughing or sneezing, cover your mouth and nose by coughing or sneezing into the elbow areas  of your shirt or coat, into a tissue or into your sleeve (not your hands). . Avoid shaking hands with others and consider head nods or verbal greetings only. . Avoid touching your eyes, nose, or mouth with unwashed hands.  . Avoid close contact with people who are sick. . Avoid places or events with large numbers of people in one location, like concerts or sporting events. . Carefully consider travel plans you have or are making. . If you are planning any travel outside or inside the [place], visit the CDC's Travelers' Health webpage for the latest health notices. . If you have some symptoms but not all symptoms, continue to monitor at home and seek medical attention if your symptoms worsen. . If you are having a medical emergency, call 911.  HOME CARE . Only take medications as instructed by your medical team. . Drink plenty of fluids and get plenty of rest. . A steam or ultrasonic humidifier can help if you have congestion.   GET HELP RIGHT AWAY IF YOU HAVE EMERGENCY WARNING SIGNS** FOR COVID-19. If you or someone is showing any of these signs seek emergency medical care immediately. Call 911 or proceed to your closest emergency facility if: . You develop worsening high fever. . Trouble breathing . Bluish lips or face . Persistent pain or pressure in the chest . New confusion . Inability to wake or stay awake . You cough up blood. . Your symptoms become more severe  **This list is not all possible symptoms. Contact your medical provider for any symptoms that are sever or concerning to you.  MAKE SURE YOU   Understand these instructions.  Will watch your condition.  Will get help right away if you are not doing well or get worse.  Your e-visit answers were reviewed by a board certified advanced clinical practitioner to complete your personal care plan.  Depending on the condition, your plan could have included both over the counter or prescription medications.  If there is a problem  please reply once you have received a response from your provider.  Your safety is important to [place].  If you have drug allergies check your prescription carefully.    You can use MyChart to ask questions about today's visit, request a non-urgent call back, or ask for a work or school excuse for 24 hours related to this e-Visit. If it has been greater than 24 hours you will need to follow up with your provider, or enter a new e-Visit to address those concerns. You will get an e-mail in the next two days asking about your experience.  I hope that your e-visit has been valuable and will speed your recovery. Thank you for using e-visits.  I provided 5 minutes of non face-to-face time during this encounter for chart review and documentation.

## 2020-08-22 ENCOUNTER — Other Ambulatory Visit (HOSPITAL_BASED_OUTPATIENT_CLINIC_OR_DEPARTMENT_OTHER): Payer: Self-pay

## 2020-08-25 ENCOUNTER — Telehealth: Payer: 59 | Admitting: Emergency Medicine

## 2020-08-25 ENCOUNTER — Other Ambulatory Visit (HOSPITAL_BASED_OUTPATIENT_CLINIC_OR_DEPARTMENT_OTHER): Payer: Self-pay

## 2020-08-25 DIAGNOSIS — J019 Acute sinusitis, unspecified: Secondary | ICD-10-CM

## 2020-08-25 MED ORDER — AMOXICILLIN-POT CLAVULANATE 875-125 MG PO TABS
1.0000 | ORAL_TABLET | Freq: Two times a day (BID) | ORAL | 0 refills | Status: DC
  Filled 2020-08-25: qty 14, 7d supply, fill #0

## 2020-08-25 NOTE — Progress Notes (Signed)
Thank you for the additional information.  Because doxycycline can also be used for sinus infection, I wanted to make sure we were double dosing you on antibiotics.  I have sent in Augmentin for today as it looks like you have done well with this in the past.   We are sorry that you are not feeling well.  Here is how we plan to help!  Based on what you have shared with me it looks like you have sinusitis.  Sinusitis is inflammation and infection in the sinus cavities of the head.  Based on your presentation I believe you most likely have Acute Bacterial Sinusitis.  This is an infection caused by bacteria and is treated with antibiotics. I have prescribed Augmentin 875mg /125mg  one tablet twice daily with food, for 7 days. You may use an oral decongestant such as Mucinex D or if you have glaucoma or high blood pressure use plain Mucinex. Saline nasal spray help and can safely be used as often as needed for congestion.  If you develop worsening sinus pain, fever or notice severe headache and vision changes, or if symptoms are not better after completion of antibiotic, please schedule an appointment with a health care provider.    Sinus infections are not as easily transmitted as other respiratory infection, however we still recommend that you avoid close contact with loved ones, especially the very young and elderly.  Remember to wash your hands thoroughly throughout the day as this is the number one way to prevent the spread of infection!  Home Care:  Only take medications as instructed by your medical team.  Complete the entire course of an antibiotic.  Do not take these medications with alcohol.  A steam or ultrasonic humidifier can help congestion.  You can place a towel over your head and breathe in the steam from hot water coming from a faucet.  Avoid close contacts especially the very young and the elderly.  Cover your mouth when you cough or sneeze.  Always remember to wash your  hands.  Get Help Right Away If:  You develop worsening fever or sinus pain.  You develop a severe head ache or visual changes.  Your symptoms persist after you have completed your treatment plan.  Make sure you  Understand these instructions.  Will watch your condition.  Will get help right away if you are not doing well or get worse.  Your e-visit answers were reviewed by a board certified advanced clinical practitioner to complete your personal care plan.  Depending on the condition, your plan could have included both over the counter or prescription medications.  If there is a problem please reply  once you have received a response from your provider.  Your safety is important to [place].  If you have drug allergies check your prescription carefully.    You can use MyChart to ask questions about today's visit, request a non-urgent call back, or ask for a work or school excuse for 24 hours related to this e-Visit. If it has been greater than 24 hours you will need to follow up with your provider, or enter a new e-Visit to address those concerns.  You will get an e-mail in the next two days asking about your experience.  I hope that your e-visit has been valuable and will speed your recovery. Thank you for using e-visits.   Approximately 5 minutes was spent documenting and reviewing patient's chart.

## 2020-08-29 ENCOUNTER — Other Ambulatory Visit (HOSPITAL_BASED_OUTPATIENT_CLINIC_OR_DEPARTMENT_OTHER): Payer: Self-pay

## 2020-08-30 ENCOUNTER — Other Ambulatory Visit (HOSPITAL_BASED_OUTPATIENT_CLINIC_OR_DEPARTMENT_OTHER): Payer: Self-pay

## 2020-08-30 MED ORDER — PANTOPRAZOLE SODIUM 40 MG PO TBEC
DELAYED_RELEASE_TABLET | ORAL | 1 refills | Status: DC
  Filled 2020-08-30: qty 90, 90d supply, fill #0
  Filled 2020-11-30: qty 90, 90d supply, fill #1

## 2020-08-31 ENCOUNTER — Other Ambulatory Visit (HOSPITAL_BASED_OUTPATIENT_CLINIC_OR_DEPARTMENT_OTHER): Payer: Self-pay

## 2020-08-31 MED ORDER — GABAPENTIN 600 MG PO TABS
ORAL_TABLET | ORAL | 0 refills | Status: DC
  Filled 2020-08-31: qty 135, 30d supply, fill #0

## 2020-09-01 ENCOUNTER — Other Ambulatory Visit (HOSPITAL_BASED_OUTPATIENT_CLINIC_OR_DEPARTMENT_OTHER): Payer: Self-pay

## 2020-09-20 ENCOUNTER — Other Ambulatory Visit (HOSPITAL_BASED_OUTPATIENT_CLINIC_OR_DEPARTMENT_OTHER): Payer: Self-pay

## 2020-09-21 ENCOUNTER — Other Ambulatory Visit (HOSPITAL_BASED_OUTPATIENT_CLINIC_OR_DEPARTMENT_OTHER): Payer: Self-pay

## 2020-09-21 MED ORDER — JARDIANCE 10 MG PO TABS
ORAL_TABLET | ORAL | 1 refills | Status: DC
  Filled 2020-09-21: qty 90, 90d supply, fill #0
  Filled 2020-12-15: qty 90, 90d supply, fill #1

## 2020-09-28 ENCOUNTER — Other Ambulatory Visit (HOSPITAL_BASED_OUTPATIENT_CLINIC_OR_DEPARTMENT_OTHER): Payer: Self-pay

## 2020-09-28 ENCOUNTER — Other Ambulatory Visit: Payer: Self-pay | Admitting: *Deleted

## 2020-09-28 ENCOUNTER — Other Ambulatory Visit: Payer: Self-pay | Admitting: Internal Medicine

## 2020-09-28 DIAGNOSIS — E1142 Type 2 diabetes mellitus with diabetic polyneuropathy: Secondary | ICD-10-CM

## 2020-09-28 MED ORDER — METFORMIN HCL 1000 MG PO TABS
1000.0000 mg | ORAL_TABLET | Freq: Two times a day (BID) | ORAL | 0 refills | Status: DC
  Filled 2020-09-28: qty 180, 90d supply, fill #0

## 2020-09-28 MED ORDER — GABAPENTIN 600 MG PO TABS
ORAL_TABLET | ORAL | 2 refills | Status: DC
Start: 2020-09-28 — End: 2020-12-28
  Filled 2020-09-28: qty 135, 30d supply, fill #0
  Filled 2020-10-30: qty 135, 30d supply, fill #1
  Filled 2020-11-30: qty 135, 30d supply, fill #2

## 2020-09-28 MED ORDER — LISINOPRIL 20 MG PO TABS
ORAL_TABLET | ORAL | 1 refills | Status: DC
  Filled 2020-09-28: qty 90, 90d supply, fill #0
  Filled 2020-12-28: qty 90, 90d supply, fill #1

## 2020-10-02 ENCOUNTER — Other Ambulatory Visit (HOSPITAL_BASED_OUTPATIENT_CLINIC_OR_DEPARTMENT_OTHER): Payer: Self-pay

## 2020-10-03 ENCOUNTER — Other Ambulatory Visit (HOSPITAL_BASED_OUTPATIENT_CLINIC_OR_DEPARTMENT_OTHER): Payer: Self-pay

## 2020-10-04 ENCOUNTER — Other Ambulatory Visit (HOSPITAL_BASED_OUTPATIENT_CLINIC_OR_DEPARTMENT_OTHER): Payer: Self-pay

## 2020-10-06 ENCOUNTER — Other Ambulatory Visit (HOSPITAL_BASED_OUTPATIENT_CLINIC_OR_DEPARTMENT_OTHER): Payer: Self-pay

## 2020-10-06 ENCOUNTER — Telehealth: Payer: 59 | Admitting: Physician Assistant

## 2020-10-06 DIAGNOSIS — L03119 Cellulitis of unspecified part of limb: Secondary | ICD-10-CM

## 2020-10-06 MED ORDER — SIMVASTATIN 40 MG PO TABS
ORAL_TABLET | ORAL | 1 refills | Status: DC
  Filled 2020-10-06: qty 90, 90d supply, fill #0
  Filled 2021-01-07: qty 90, 90d supply, fill #1

## 2020-10-06 MED ORDER — CEPHALEXIN 500 MG PO CAPS
500.0000 mg | ORAL_CAPSULE | Freq: Four times a day (QID) | ORAL | 0 refills | Status: DC
  Filled 2020-10-06: qty 20, 5d supply, fill #0

## 2020-10-06 NOTE — Progress Notes (Signed)
E Visit for Cellulitis  We are sorry that you are not feeling well. Here is how we plan to help!  Based on what you shared with me it looks like you have cellulitis.  Cellulitis looks like areas of skin redness, swelling, and warmth; it develops as a result of bacteria entering under the skin. Little red spots and/or bleeding can be seen in skin, and tiny surface sacs containing fluid can occur. Fever can be present. Cellulitis is almost always on one side of a body, and the lower limbs are the most common site of involvement.   I have prescribed:  Keflex 500mg  take one by mouth four times a day for 5 days  HOME CARE:  Take your medications as ordered and take all of them, even if the skin irritation appears to be healing.   GET HELP RIGHT AWAY IF:  Symptoms that don't begin to go away within 48 hours. Severe redness persists or worsens If the area turns color, spreads or swells. If it blisters and opens, develops yellow-brown crust or bleeds. You develop a fever or chills. If the pain increases or becomes unbearable.  Are unable to keep fluids and food down.  MAKE SURE YOU   Understand these instructions. Will watch your condition. Will get help right away if you are not doing well or get worse.  Thank you for choosing an e-visit.  Your e-visit answers were reviewed by a board certified advanced clinical practitioner to complete your personal care plan. Depending upon the condition, your plan could have included both over the counter or prescription medications.  Please review your pharmacy choice. Make sure the pharmacy is open so you can pick up prescription now. If there is a problem, you may contact your provider through [employer] and have the prescription routed to another pharmacy.  Your safety is important to [place]. If you have drug allergies check your prescription carefully.   For the next 24 hours you can use MyChart to ask questions about today's visit, request a  non-urgent call back, or ask for a work or school excuse. You will get an email in the next two days asking about your experience. I hope that your e-visit has been valuable and will speed your recovery.  I provided 6 minutes of non face-to-face time during this encounter for chart review and documentation.

## 2020-10-11 ENCOUNTER — Other Ambulatory Visit (HOSPITAL_BASED_OUTPATIENT_CLINIC_OR_DEPARTMENT_OTHER): Payer: Self-pay

## 2020-10-13 ENCOUNTER — Other Ambulatory Visit (HOSPITAL_BASED_OUTPATIENT_CLINIC_OR_DEPARTMENT_OTHER): Payer: Self-pay

## 2020-10-30 ENCOUNTER — Other Ambulatory Visit (HOSPITAL_BASED_OUTPATIENT_CLINIC_OR_DEPARTMENT_OTHER): Payer: Self-pay

## 2020-11-30 ENCOUNTER — Other Ambulatory Visit (HOSPITAL_BASED_OUTPATIENT_CLINIC_OR_DEPARTMENT_OTHER): Payer: Self-pay

## 2020-12-14 ENCOUNTER — Ambulatory Visit: Payer: 59 | Admitting: Internal Medicine

## 2020-12-15 ENCOUNTER — Other Ambulatory Visit (HOSPITAL_BASED_OUTPATIENT_CLINIC_OR_DEPARTMENT_OTHER): Payer: Self-pay

## 2020-12-15 ENCOUNTER — Other Ambulatory Visit: Payer: Self-pay | Admitting: Internal Medicine

## 2020-12-18 ENCOUNTER — Other Ambulatory Visit (HOSPITAL_BASED_OUTPATIENT_CLINIC_OR_DEPARTMENT_OTHER): Payer: Self-pay

## 2020-12-19 ENCOUNTER — Other Ambulatory Visit (HOSPITAL_BASED_OUTPATIENT_CLINIC_OR_DEPARTMENT_OTHER): Payer: Self-pay

## 2020-12-28 ENCOUNTER — Other Ambulatory Visit: Payer: Self-pay | Admitting: Internal Medicine

## 2020-12-28 ENCOUNTER — Other Ambulatory Visit (HOSPITAL_BASED_OUTPATIENT_CLINIC_OR_DEPARTMENT_OTHER): Payer: Self-pay

## 2020-12-28 ENCOUNTER — Other Ambulatory Visit: Payer: Self-pay | Admitting: Diagnostic Neuroimaging

## 2020-12-28 DIAGNOSIS — E1165 Type 2 diabetes mellitus with hyperglycemia: Secondary | ICD-10-CM

## 2020-12-28 MED ORDER — GABAPENTIN 600 MG PO TABS
900.0000 mg | ORAL_TABLET | Freq: Three times a day (TID) | ORAL | 2 refills | Status: AC
  Filled 2020-12-28: qty 135, 30d supply, fill #0
  Filled 2021-04-02: qty 135, 30d supply, fill #1
  Filled 2021-05-03: qty 135, 30d supply, fill #2

## 2020-12-28 MED ORDER — METFORMIN HCL 1000 MG PO TABS
1000.0000 mg | ORAL_TABLET | Freq: Two times a day (BID) | ORAL | 0 refills | Status: DC
  Filled 2020-12-28: qty 180, 90d supply, fill #0

## 2021-01-08 ENCOUNTER — Other Ambulatory Visit (HOSPITAL_BASED_OUTPATIENT_CLINIC_OR_DEPARTMENT_OTHER): Payer: Self-pay

## 2021-01-16 ENCOUNTER — Other Ambulatory Visit (HOSPITAL_BASED_OUTPATIENT_CLINIC_OR_DEPARTMENT_OTHER): Payer: Self-pay

## 2021-01-16 DIAGNOSIS — E7800 Pure hypercholesterolemia, unspecified: Secondary | ICD-10-CM | POA: Diagnosis not present

## 2021-01-16 DIAGNOSIS — I10 Essential (primary) hypertension: Secondary | ICD-10-CM | POA: Diagnosis not present

## 2021-01-16 DIAGNOSIS — K219 Gastro-esophageal reflux disease without esophagitis: Secondary | ICD-10-CM | POA: Diagnosis not present

## 2021-01-16 DIAGNOSIS — G629 Polyneuropathy, unspecified: Secondary | ICD-10-CM | POA: Diagnosis not present

## 2021-01-16 DIAGNOSIS — Z0000 Encounter for general adult medical examination without abnormal findings: Secondary | ICD-10-CM | POA: Diagnosis not present

## 2021-01-16 DIAGNOSIS — E1169 Type 2 diabetes mellitus with other specified complication: Secondary | ICD-10-CM | POA: Diagnosis not present

## 2021-01-16 DIAGNOSIS — F320 Major depressive disorder, single episode, mild: Secondary | ICD-10-CM | POA: Diagnosis not present

## 2021-01-16 DIAGNOSIS — Z23 Encounter for immunization: Secondary | ICD-10-CM | POA: Diagnosis not present

## 2021-01-16 DIAGNOSIS — G4733 Obstructive sleep apnea (adult) (pediatric): Secondary | ICD-10-CM | POA: Diagnosis not present

## 2021-01-16 MED ORDER — PANTOPRAZOLE SODIUM 40 MG PO TBEC
DELAYED_RELEASE_TABLET | ORAL | 1 refills | Status: DC
  Filled 2021-03-01: qty 90, 90d supply, fill #0
  Filled 2021-05-30: qty 90, 90d supply, fill #1

## 2021-01-16 MED ORDER — GABAPENTIN 600 MG PO TABS
ORAL_TABLET | ORAL | 1 refills | Status: DC
  Filled 2021-01-28: qty 135, 30d supply, fill #0
  Filled 2021-03-01: qty 135, 30d supply, fill #1

## 2021-01-16 MED ORDER — LISINOPRIL 20 MG PO TABS
ORAL_TABLET | ORAL | 1 refills | Status: DC
  Filled 2021-04-02: qty 90, 90d supply, fill #0
  Filled 2021-07-03: qty 90, 90d supply, fill #1

## 2021-01-16 MED ORDER — ICOSAPENT ETHYL 1 G PO CAPS
ORAL_CAPSULE | ORAL | 5 refills | Status: DC
  Filled 2021-01-28: qty 120, 30d supply, fill #0
  Filled 2021-03-01: qty 120, 30d supply, fill #1
  Filled 2021-04-02: qty 120, 30d supply, fill #2
  Filled 2021-05-03: qty 120, 30d supply, fill #3
  Filled 2021-05-30: qty 120, 30d supply, fill #4
  Filled 2021-07-03: qty 120, 30d supply, fill #5

## 2021-01-16 MED ORDER — SIMVASTATIN 40 MG PO TABS
ORAL_TABLET | ORAL | 1 refills | Status: DC
  Filled 2021-04-02: qty 90, 90d supply, fill #0
  Filled 2021-07-03: qty 90, 90d supply, fill #1

## 2021-01-25 ENCOUNTER — Other Ambulatory Visit: Payer: Self-pay

## 2021-01-25 ENCOUNTER — Ambulatory Visit: Payer: 59 | Admitting: Internal Medicine

## 2021-01-25 ENCOUNTER — Encounter: Payer: Self-pay | Admitting: Internal Medicine

## 2021-01-25 VITALS — BP 128/78 | HR 98 | Ht 70.0 in | Wt 295.2 lb

## 2021-01-25 DIAGNOSIS — E1165 Type 2 diabetes mellitus with hyperglycemia: Secondary | ICD-10-CM | POA: Diagnosis not present

## 2021-01-25 DIAGNOSIS — E785 Hyperlipidemia, unspecified: Secondary | ICD-10-CM | POA: Diagnosis not present

## 2021-01-25 DIAGNOSIS — E1142 Type 2 diabetes mellitus with diabetic polyneuropathy: Secondary | ICD-10-CM | POA: Diagnosis not present

## 2021-01-25 NOTE — Progress Notes (Addendum)
Patient ID: Victor Estrada, male   DOB: Aug 16, 1970, 50 y.o.   MRN: [id number]   This visit occurred during the SARS-CoV-2 public health emergency.  Safety protocols were in place, including screening questions prior to the visit, additional usage of staff PPE, and extensive cleaning of exam room while observing appropriate contact time as indicated for disinfecting solutions.   HPI: Victor Estrada is a 50 y.o.-year-old male, initially referred by his previous PCP, Dr. Moreen Fowler, returning for follow-up for DM2, dx in 2005, non-insulin-dependent, uncontrolled, with long-term complications (PN). His wife is also my pt: Victor Estrada. He saw Dr. Loanne Drilling in 2013.  Last visit 6 months ago.  Interim history: No increased urination, blurry vision, nausea, chest pain.  Reviewed latest HbA1c level: 01/16/2021: HbA1c 7.7% 06/30/2020: HbA1c 7.7% Lab Results  Component Value Date   HGBA1C 10.5 12/30/2019   HGBA1C 9.8 04/13/2019   HGBA1C 11.1 (H) 11/03/2015   HGBA1C 10.5 (H) 02/06/2015   HGBA1C 6.6 (H) 09/14/2013   HGBA1C 6.8 (H) 05/28/2013   HGBA1C 10.5 (H) 01/27/2012   HGBA1C 9.9 (H) 10/01/2011   HGBA1C 9.9 (H) 05/23/2011  05/19/2018: HbA1c 11% 04/01/2017: HbA1c 11.2%  Previously on: - Janumet 50-1000 mg 2x a day, with meals - Jardiance 10 mg before breakfast He tried Glimepiride in the past.  We changed to: - Metformin 1000 mg 2x a day with meals - Jardiance 10 mg before b'fast - Trulicity 7.42 >> 1.5 >> 3 mg weekly - only took 3 doses in last 4 months (forgot) - Tresiba 14 units (started 04/2020) >> 32 units daily  He checks sugars 4x a day with the CGM.  He forgot his receiver at last visit. We were able to download this now:   Previously: - am: 98, 100-150, 210 >> 180-250 >> 100-150, 180 - 2h after b'fast: n/c - before lunch: n/c >> see above - 2h after lunch: n/c >> 180-200 - before dinner: n/c - 2h after dinner: n/c >> 150-200, 250, 280 - bedtime: n/c - nighttime: n/c Lowest sugar  was 130  >> 95 >> 100; he has hypoglycemia awareness in the 90s. Highest sugar was 350 >> 280 >> 300.  Glucometer: One Touch ultra >> Freestyle  Pt's meals are: - Breakfast: skip - Lunch: lean cuisine, fast food - Dinner: same - Snacks: protein bars, PB crackers, fruit Stopped regular sodas.  -+ Mild CKD, last BUN/creatinine:  01/16/2021: 27/21.04, GFR 88, glucose 124 06/30/2020: 27/1.0, GFR 92, glucose 121 Lab Results  Component Value Date   BUN 23 (H) 11/09/2018   BUN 19 04/23/2016   CREATININE 1.09 11/09/2018   CREATININE 1.29 (H) 04/23/2016  05/19/2018: 24/1.07, GFR 74, glucose 306, otherwise normal CMP On lisinopril 20.  -+ HL; last set of lipids: 01/16/2021: 132/228/35/60 06/30/2020: 142/249/30/71 05/19/2018: 368/1181/33/not calculated Lab Results  Component Value Date   CHOL 151 11/03/2015   HDL 37 (L) 11/03/2015   LDLCALC 48 11/03/2015   LDLDIRECT 92.0 02/06/2015   TRIG 330 (H) 11/03/2015   CHOLHDL 4.1 11/03/2015  On Zocor 40, Vascepa added 04/2018.  - last eye exam was in 2021: No DR reportedly. [facility].  -+ Numbness and tingling in his feet.  On Neurontin 600 >> 900 mg 2 >> 3x a day.  Pt has FH of DM in father.  She also has a history of HTN, GERD, OSA, obesity class III, kidney stones.  He has a family history of bile duct cancer in mother.  ROS: + see HPI Gastrointestinal:  no N/no V/no D/no C/+ acid reflux Neurological: no tremors/+ numbness/+ tingling/no dizziness  I reviewed pt's medications, allergies, PMH, social hx, family hx, and changes were documented in the history of present illness. Otherwise, unchanged from my initial visit note.  Past Medical History:  Diagnosis Date   Anxiety    Complication of anesthesia    Constipation    Depression    Diabetes mellitus    type 2   GERD (gastroesophageal reflux disease)    Head injury, acute, without loss of consciousness    History of kidney stones    Hyperlipidemia    Hypertension     Kidney stone    Peripheral neuropathy    PONV (postoperative nausea and vomiting)    post wisdom teeth   Shortness of breath    with exertion   Sinus disorder    Sleep apnea    Past Surgical History:  Procedure Laterality Date   DENTAL SURGERY     INCISION AND DRAINAGE ABSCESS Left 11/12/2013   Procedure: INCISION AND DRAINAGE LEFT CHEEK  ABSCESS;  Surgeon: Melida Quitter, MD;  Location: [place];  Service: ENT;  Laterality: Left;   NASAL SEPTOPLASTY W/ TURBINOPLASTY N/A 04/26/2016   Procedure: NASAL SEPTOPLASTY WITH TURBINATE REDUCTION;  Surgeon: Melida Quitter, MD;  Location: [facility];  Service: ENT;  Laterality: N/A;   WISDOM TOOTH EXTRACTION     Social History   Socioeconomic History   Marital status: Married  Occupational History  Social Designer, fashion/clothing strain: Not on file   Food insecurity:    Worry: Not on file    Inability: Not on file   Transportation needs:    Medical: Not on file    Non-medical: Not on file  Tobacco Use   Smoking status: Never Smoker   Smokeless tobacco: Never Used  Substance and Sexual Activity   Alcohol use: No   Drug use: No   Sexual activity: Not on file  Lifestyle   Physical activity:    Days per week: Not on file    Minutes per session: Not on file   Stress: Not on file  Relationships   Social connections:    Talks on phone: Not on file    Gets together: Not on file    Attends religious service: Not on file    Active member of club or organization: Not on file    Attends meetings of clubs or organizations: Not on file    Relationship status: Not on file   Intimate partner violence:    Fear of current or ex partner: Not on file    Emotionally abused: Not on file    Physically abused: Not on file    Forced sexual activity: Not on file  Other Topics Concern   Not on file  Social History Narrative   Married 1 son   Dance movement psychotherapist   2-3 caffeine drinks/day   Current Outpatient Medications on File Prior to Visit   Medication Sig Dispense Refill   acetaminophen (TYLENOL) 500 MG tablet Take 1,000 mg by mouth every 6 (six) hours as needed (for pain).      cephALEXin (KEFLEX) 500 MG capsule Take 1 capsule (500 mg total) by mouth 4 (four) times daily. 20 capsule 0   clindamycin (CLEOCIN T) 1 % lotion Apply topically 2 (two) times daily.     Continuous Blood Gluc Receiver (DEXCOM G6 RECEIVER) DEVI USE TO CHECK SUGAR 3- 4 TIMES DAILY 1 each 0  Continuous Blood Gluc Sensor (DEXCOM G6 SENSOR) MISC USE TO CHECK SUGAR 3-4 TIMES DAILY 9 each 3   Continuous Blood Gluc Transmit (DEXCOM G6 TRANSMITTER) MISC USE TO CHECK SUGAR 3 - 4 TIMES DAILY 1 each 3   Dulaglutide (TRULICITY) 3 ER/7.4YC SOPN Inject 3 mg into the skin once a week. 6 mL 3   empagliflozin (JARDIANCE) 10 MG TABS tablet Take 1 tablet by mouth every morning 90 days 90 tablet 1   fexofenadine (ALLEGRA) 180 MG tablet Take by mouth.     gabapentin (NEURONTIN) 600 MG tablet Take 1.5 tablets (900 mg total) by mouth every 8 (eight) hours. 135 tablet 2   gabapentin (NEURONTIN) 600 MG tablet Take 1&1/2 tablets by mouth every 8 hours 135 tablet 1   ibuprofen (ADVIL) 200 MG tablet Take 200 mg by mouth every 6 (six) hours as needed.     icosapent Ethyl (VASCEPA) 1 g capsule Take 2 capsules by mouth  with meals Twice a day 120 capsule 5   insulin degludec (TRESIBA) 200 UNIT/ML FlexTouch Pen Inject up to 36 units into the skin daily. 9 mL 1   Insulin Pen Needle 32G X 4 MM MISC USE ONCE A DAY AS DIRECTED 100 each 3   lisinopril (ZESTRIL) 20 MG tablet Take 1 tablet by mouth once a day 90 tablet 1   metFORMIN (GLUCOPHAGE) 1000 MG tablet Take 1 tablet (1,000 mg total) by mouth 2 (two) times daily with a meal. 180 tablet 0   Multiple Vitamin (MULTIVITAMIN) tablet Take 1 tablet by mouth daily.     pantoprazole (PROTONIX) 40 MG tablet Take 1 tablet by mouth once a day 90 tablet 1   simvastatin (ZOCOR) 40 MG tablet TAKE 1 TABLET BY MOUTH ONCE DAILY 90 tablet 1   simvastatin  (ZOCOR) 40 MG tablet Take 1 tablet by mouth in the evening once a day 90 tablet 1   No current facility-administered medications on file prior to visit.   No Known Allergies Family History  Problem Relation Age of Onset   Hyperlipidemia Father    Heart disease Father    Diabetes Father    Heart disease Maternal Grandmother    Diabetes Paternal Grandmother    Cancer Mother    PE: BP 128/78 (BP Location: Right Arm, Patient Position: Sitting, Cuff Size: Normal)   Pulse 98   Ht 5\' 10"  (1.778 m)   Wt 295 lb 3.2 oz (133.9 kg)   SpO2 97%   BMI 42.36 kg/m  Wt Readings from Last 3 Encounters:  01/25/21 295 lb 3.2 oz (133.9 kg)  08/11/20 299 lb 9.6 oz (135.9 kg)  07/04/20 300 lb (136.1 kg)   Constitutional: overweight, in NAD Eyes: PERRLA, EOMI, no exophthalmos ENT: moist mucous membranes, no thyromegaly, no cervical lymphadenopathy Cardiovascular: tachycardia, RR, No MRG Respiratory: CTA B Gastrointestinal: abdomen soft, NT, ND, BS+ Musculoskeletal: no deformities, strength intact in all 4 Skin: moist, warm, no rashes Neurological: no tremor with outstretched hands, DTR normal in all 4  ASSESSMENT: 1. DM2, non-insulin-dependent, uncontrolled, with long-term complications -PN -Mild CKD  2. HL  3. Obesity class III  PLAN:  1. Patient with longstanding, uncontrolled, type 2 diabetes, on oral antidiabetic regimen with metformin, SGLT2 inhibitor, weekly GLP-1 receptor agonist and also daily long-acting insulin, with improved blood sugars before last visit.  HbA1c decreased to 7.7% on 06/30/2020, decreased from 10.5% 6 months prior.  At that time, sugars were better but they were still above target later in the  day, due to dietary indiscretions.  Sugars were as high as 250-280 after a larger meal.  Discussed about reducing the size of his meals but I also suggested to increase Trulicity to 3 mg weekly.  We continued the rest of the regimen. -He had another HbA1c obtained several days  ago which was stable, at 7.7% CGM interpretation: -At today's visit, we reviewed his CGM downloads: It appears that 68% of values are in target range (goal >70%), while 32% are higher than 180 (goal <25%), and 0% are lower than 70 (goal <4%).  The calculated average blood sugar is 172.   -Reviewing the CGM trends, it appears that his sugars increase after lunch/brunch and only slightly immediately after dinner, however, they increase significantly after approximately 2 AM, peaking at 4:30 AM.  After this, they slowly decreased.  Upon questioning, he is going to bed late and may have a snack late in the evening.  We discussed that ideally he would stop these, but for now, I suggested to take Trulicity consistently since he mentions that he has not been taking but very few doses since last visit as he forgets it.  I explained that this will help decreasing his appetite and also lowering the blood sugars after meals.  Also, to help with blood sugars overnight, I advised him to try to move the entire metformin dose with dinner. -He started to walk 2-3 times a week with his wife and I advised him to try to find something that he can do inside, also, even when the weather is not permitting. - I suggested to:  Patient Instructions  Please change: - Metformin 2000 mg with dinner  Continue: - Jardiance 10 mg before b'fast - Tresiba 32 units daily  Take consistently: - Trulicity 3 mg weekly  Please return in 4 months.  - advised to check sugars at different times of the day - 1x a day, rotating check times - advised for yearly eye exams >> he is not UTD - return to clinic in 4 months  2. HL -Reviewed latest lipid panel from 12/2020: LDL was at goal, triglycerides elevated, HDL also at goal -Continues Zocor 40 mg daily and Vascepa 2 g twice a day without side effects  3. Obesity class III -He lost 4 pounds before last visit, previously gained 15 pounds. -continue SGLT 2 inhibitor and GLP-1 receptor  agonist which should also help with weight loss -Lost 4 pounds since last visit  Philemon Kingdom, MD PhD [facility] Endocrinology

## 2021-01-25 NOTE — Patient Instructions (Addendum)
Please change: - Metformin 2000 mg with dinner  Continue: - Jardiance 10 mg before b'fast - Tresiba 32 units daily  Take consistently: - Trulicity 3 mg weekly  Please return in 4 months.

## 2021-01-28 ENCOUNTER — Other Ambulatory Visit: Payer: Self-pay | Admitting: Internal Medicine

## 2021-01-28 DIAGNOSIS — E1142 Type 2 diabetes mellitus with diabetic polyneuropathy: Secondary | ICD-10-CM

## 2021-01-29 ENCOUNTER — Other Ambulatory Visit (HOSPITAL_BASED_OUTPATIENT_CLINIC_OR_DEPARTMENT_OTHER): Payer: Self-pay

## 2021-01-29 MED ORDER — TRESIBA FLEXTOUCH 200 UNIT/ML ~~LOC~~ SOPN
PEN_INJECTOR | SUBCUTANEOUS | 1 refills | Status: DC
  Filled 2021-01-29: qty 9, 50d supply, fill #0
  Filled 2021-06-11: qty 9, 50d supply, fill #1

## 2021-02-09 ENCOUNTER — Other Ambulatory Visit (HOSPITAL_BASED_OUTPATIENT_CLINIC_OR_DEPARTMENT_OTHER): Payer: Self-pay

## 2021-02-09 ENCOUNTER — Other Ambulatory Visit: Payer: Self-pay | Admitting: Internal Medicine

## 2021-02-09 DIAGNOSIS — E1142 Type 2 diabetes mellitus with diabetic polyneuropathy: Secondary | ICD-10-CM

## 2021-02-09 MED ORDER — METFORMIN HCL 1000 MG PO TABS
1000.0000 mg | ORAL_TABLET | Freq: Two times a day (BID) | ORAL | 1 refills | Status: DC
  Filled 2021-02-09 – 2021-03-21 (×2): qty 180, 90d supply, fill #0
  Filled 2021-05-03 – 2021-07-03 (×2): qty 180, 90d supply, fill #1

## 2021-02-14 ENCOUNTER — Other Ambulatory Visit (HOSPITAL_BASED_OUTPATIENT_CLINIC_OR_DEPARTMENT_OTHER): Payer: Self-pay

## 2021-02-16 ENCOUNTER — Other Ambulatory Visit (HOSPITAL_BASED_OUTPATIENT_CLINIC_OR_DEPARTMENT_OTHER): Payer: Self-pay

## 2021-02-20 ENCOUNTER — Other Ambulatory Visit (HOSPITAL_BASED_OUTPATIENT_CLINIC_OR_DEPARTMENT_OTHER): Payer: Self-pay

## 2021-03-01 ENCOUNTER — Other Ambulatory Visit (HOSPITAL_BASED_OUTPATIENT_CLINIC_OR_DEPARTMENT_OTHER): Payer: Self-pay

## 2021-03-09 ENCOUNTER — Other Ambulatory Visit (HOSPITAL_BASED_OUTPATIENT_CLINIC_OR_DEPARTMENT_OTHER): Payer: Self-pay

## 2021-03-09 ENCOUNTER — Other Ambulatory Visit: Payer: Self-pay | Admitting: Internal Medicine

## 2021-03-09 MED ORDER — ULTICARE MICRO PEN NEEDLES 32G X 4 MM MISC
3 refills | Status: DC
Start: 2021-03-09 — End: 2022-03-06
  Filled 2021-03-09 – 2021-03-22 (×2): qty 100, 90d supply, fill #0
  Filled 2021-11-18: qty 100, 90d supply, fill #1

## 2021-03-21 ENCOUNTER — Other Ambulatory Visit (HOSPITAL_BASED_OUTPATIENT_CLINIC_OR_DEPARTMENT_OTHER): Payer: Self-pay

## 2021-03-21 MED ORDER — JARDIANCE 10 MG PO TABS
ORAL_TABLET | ORAL | 1 refills | Status: DC
  Filled 2021-03-21: qty 90, 90d supply, fill #0
  Filled 2021-06-20: qty 90, 90d supply, fill #1

## 2021-03-22 ENCOUNTER — Other Ambulatory Visit (HOSPITAL_BASED_OUTPATIENT_CLINIC_OR_DEPARTMENT_OTHER): Payer: Self-pay

## 2021-04-03 ENCOUNTER — Other Ambulatory Visit (HOSPITAL_BASED_OUTPATIENT_CLINIC_OR_DEPARTMENT_OTHER): Payer: Self-pay

## 2021-05-04 ENCOUNTER — Other Ambulatory Visit (HOSPITAL_BASED_OUTPATIENT_CLINIC_OR_DEPARTMENT_OTHER): Payer: Self-pay

## 2021-05-30 ENCOUNTER — Other Ambulatory Visit (HOSPITAL_BASED_OUTPATIENT_CLINIC_OR_DEPARTMENT_OTHER): Payer: Self-pay

## 2021-05-30 MED ORDER — GABAPENTIN 600 MG PO TABS
900.0000 mg | ORAL_TABLET | Freq: Three times a day (TID) | ORAL | 0 refills | Status: DC
  Filled 2021-05-30: qty 135, 30d supply, fill #0

## 2021-05-31 ENCOUNTER — Ambulatory Visit: Payer: 59 | Admitting: Internal Medicine

## 2021-06-11 ENCOUNTER — Other Ambulatory Visit (HOSPITAL_BASED_OUTPATIENT_CLINIC_OR_DEPARTMENT_OTHER): Payer: Self-pay

## 2021-06-12 ENCOUNTER — Other Ambulatory Visit (HOSPITAL_BASED_OUTPATIENT_CLINIC_OR_DEPARTMENT_OTHER): Payer: Self-pay

## 2021-06-18 ENCOUNTER — Encounter: Payer: Self-pay | Admitting: Physician Assistant

## 2021-06-20 ENCOUNTER — Other Ambulatory Visit (HOSPITAL_BASED_OUTPATIENT_CLINIC_OR_DEPARTMENT_OTHER): Payer: Self-pay

## 2021-07-03 ENCOUNTER — Ambulatory Visit: Payer: 59 | Admitting: Physician Assistant

## 2021-07-03 ENCOUNTER — Other Ambulatory Visit (HOSPITAL_BASED_OUTPATIENT_CLINIC_OR_DEPARTMENT_OTHER): Payer: Self-pay

## 2021-07-03 ENCOUNTER — Encounter: Payer: Self-pay | Admitting: *Deleted

## 2021-07-03 VITALS — BP 114/68 | HR 90 | Ht 70.0 in | Wt 299.2 lb

## 2021-07-03 DIAGNOSIS — Z1211 Encounter for screening for malignant neoplasm of colon: Secondary | ICD-10-CM

## 2021-07-03 DIAGNOSIS — K219 Gastro-esophageal reflux disease without esophagitis: Secondary | ICD-10-CM

## 2021-07-03 DIAGNOSIS — R1310 Dysphagia, unspecified: Secondary | ICD-10-CM

## 2021-07-03 DIAGNOSIS — E1142 Type 2 diabetes mellitus with diabetic polyneuropathy: Secondary | ICD-10-CM

## 2021-07-03 DIAGNOSIS — E1165 Type 2 diabetes mellitus with hyperglycemia: Secondary | ICD-10-CM | POA: Diagnosis not present

## 2021-07-03 MED ORDER — PLENVU 140 G PO SOLR
1.0000 | ORAL | 0 refills | Status: DC
  Filled 2021-07-03: qty 3, 1d supply, fill #0

## 2021-07-03 NOTE — Progress Notes (Signed)
? ? ? ?07/03/2021 ?Bradly Bienenstock Asmar ?[id number] ?April 27, 1970 ? ? ?ASSESSMENT AND PLAN:  ? ?Screen for colon cancer ?We have discussed the risks of bleeding, infection, perforation, medication reactions, and remote risk of death associated with colonoscopy. All questions were answered and the patient acknowledges these risk and wishes to proceed. ? ?Dysphagia, unspecified type ? EGD to evaluate for structural abnormality, tumor, erosive/infectious esophagititis, and EOE.   ?If the EGD is negative can then proceed to barium swallow and/or esophageal manometry. ?Continue PPI at this time ?I discussed risks of EGD with patient today, including risk of sedation, bleeding or perforation.  ?Patient provides understanding and gave verbal consent to proceed. ? ?Gastroesophageal reflux disease without esophagitis ?he reports symptoms are currently well controlled, and denies breakthrough reflux, burning in chest, hoarseness or cough. ?Lifestyle changes discussed, avoid NSAIDS, weight loss discussed ?Continue current medications ? ?Class 3 obesity (HCC) ?Continue weight loss ? ?Poorly controlled type 2 diabetes mellitus with peripheral neuropathy ([place]) ?Reminded sugars need to be controlled prior to endoscopy ? ?Patient Care Team: ?Antony Contras, MD as PCP - General (Family Medicine) ? ?HISTORY OF PRESENT ILLNESS: ?51 y.o. male referred by Antony Contras, MD, with a past medical history of type 2 diabetes insulin-dependent, hypertension, hyperlipidemia, sleep apnea and others listed below presents for evaluation of GERD and dysphagia.  ?Patient is on Trulicity for over a year and [place]. ?Known to Dr. Carlean Purl. ? ?Patient has GERD, on pantoprazole, which is fairly well controlled but he still has burping/belching. Over the last several years mild dysphagia with pills and solids, has to drink more water, has become more frequent.  ? ?Denies nausea, vomiting, melena.  ?Has BM once a day. ?Patient denies change in bowel habits,  constipation, diarrhea, hematochezia.  ?Denies changes in appetite, unintentional weight loss.  ?Mother with bile duct cancer age 72, and maternal aunt with AB cancer unknown primary at age 76, denies family history of colon cancer.  ? ?CBC  01/16/2021 ?HGB 14.6 MCV 83.2 without evidence of anemia ?BUN 27 Cr 1.04  ?GFR >60  ?Potassium 4.0   ?A1C 7.7 ?Normal LFTs ? ?Current Medications:  ? ?Current Outpatient Medications (Endocrine & Metabolic):  ?  Dulaglutide (TRULICITY) 3 PY/0.9XI SOPN, Inject 3 mg into the skin once a week. ?  empagliflozin (JARDIANCE) 10 MG TABS tablet, Take 1 tablet by mouth every morning ?  insulin degludec (TRESIBA FLEXTOUCH) 200 UNIT/ML FlexTouch Pen, Inject up to 36 units into the skin daily. ?  metFORMIN (GLUCOPHAGE) 1000 MG tablet, Take 1 tablet (1,000 mg total) by mouth 2 (two) times daily with a meal. ? ?Current Outpatient Medications (Cardiovascular):  ?  icosapent Ethyl (VASCEPA) 1 g capsule, Take 2 capsules by mouth  with meals Twice a day ?  lisinopril (ZESTRIL) 20 MG tablet, Take 1 tablet by mouth once a day ?  simvastatin (ZOCOR) 40 MG tablet, Take 1 tablet by mouth in the evening once a day ?  simvastatin (ZOCOR) 40 MG tablet, TAKE 1 TABLET BY MOUTH ONCE DAILY ? ?Current Outpatient Medications (Respiratory):  ?  fexofenadine (ALLEGRA) 180 MG tablet, Take by mouth. ? ?Current Outpatient Medications (Analgesics):  ?  acetaminophen (TYLENOL) 500 MG tablet, Take 1,000 mg by mouth every 6 (six) hours as needed (for pain).  ?  ibuprofen (ADVIL) 200 MG tablet, Take 200 mg by mouth every 6 (six) hours as needed. ? ? ?Current Outpatient Medications (Other):  ?  Continuous Blood Gluc Sensor (DEXCOM G6 SENSOR) MISC, USE TO CHECK  SUGAR 3-4 TIMES DAILY ?  Continuous Blood Gluc Transmit (DEXCOM G6 TRANSMITTER) MISC, USE TO CHECK SUGAR 3 - 4 TIMES DAILY ?  gabapentin (NEURONTIN) 600 MG tablet, Take 1.5 tablets (900 mg total) by mouth every 8 (eight) hours. ?  gabapentin (NEURONTIN) 600 MG  tablet, Take 1 & 1/2 tablet (900 mg total) by mouth every 8 (eight) hours ?  Insulin Pen Needle (ULTICARE MICRO PEN NEEDLES) 32G X 4 MM MISC, USE ONCE A DAY AS DIRECTED ?  Multiple Vitamin (MULTIVITAMIN) tablet, Take 1 tablet by mouth daily. ?  pantoprazole (PROTONIX) 40 MG tablet, Take 1 tablet by mouth once a day ? ?Medical History:  ?Past Medical History:  ?Diagnosis Date  ? Anxiety   ? Complication of anesthesia   ? Constipation   ? Depression   ? Diabetes mellitus   ? type 2  ? GERD (gastroesophageal reflux disease)   ? Head injury, acute, without loss of consciousness   ? History of kidney stones   ? Hyperlipidemia   ? Hypertension   ? Kidney stone   ? MDD (major depressive disorder)   ? Peripheral neuropathy   ? PONV (postoperative nausea and vomiting)   ? post wisdom teeth  ? Shortness of breath   ? with exertion  ? Sinus disorder   ? Sleep apnea   ? ?Allergies: No Known Allergies  ? ?Surgical History:  ?He  has a past surgical history that includes Dental surgery; Wisdom tooth extraction; Incision and drainage abscess (Left, 11/12/2013); and Nasal septoplasty w/ turbinoplasty (N/A, 04/26/2016). ?Family History:  ?His family history includes Cancer in his mother; Diabetes in his father and paternal grandmother; Heart disease in his father and maternal grandmother; Hyperlipidemia in his father; Stomach cancer in his maternal aunt. ?Social History:  ? reports that he has never smoked. He has never used smokeless tobacco. He reports that he does not drink alcohol and does not use drugs. ? ?REVIEW OF SYSTEMS  : All other systems reviewed and negative except where noted in the History of Present Illness. ? ? ?PHYSICAL EXAM: ?BP 114/68   Pulse 90   Ht '5\' 10"'$  (1.778 m)   Wt 299 lb 3.2 oz (135.7 kg)   SpO2 97%   BMI 42.93 kg/m?  ?General:   Pleasant, well developed male in no acute distress ?Head:  Normocephalic and atraumatic. ?Eyes: sclerae anicteric,conjunctive pink  ?Heart:  regular rate and rhythm ?Pulm:  Clear anteriorly; no wheezing ?Abdomen:   Soft, Obese AB, Active bowel sounds. No tenderness . Without guarding and Without rebound, No organomegaly appreciated. ?Extremities:  Without edema. ?Msk:  Symmetrical without gross deformities. Peripheral pulses intact.  ?Neurologic:  Alert and  oriented x4;  No focal deficits.  ?Skin:   Dry and intact without significant lesions or rashes. ?Psychiatric: Cooperative. Normal mood and affect. ? ? ? ?Vladimir Crofts, PA-C ?2:36 PM ? ? ?

## 2021-07-03 NOTE — Patient Instructions (Addendum)
You have been scheduled for an endoscopy and colonoscopy. Please follow the written instructions given to you at your visit today. ?Please pick up your prep supplies at the pharmacy within the next 1-3 days. ?If you use inhalers (even only as needed), please bring them with you on the day of your procedure. ? ?If you are age 50 or older, your body mass index should be between 23-30. Your Body mass index is 42.93 kg/m?Marland Kitchen If this is out of the aforementioned range listed, please consider follow up with your Primary Care Provider. ? ?If you are age 16 or younger, your body mass index should be between 19-25. Your Body mass index is 42.93 kg/m?Marland Kitchen If this is out of the aformentioned range listed, please consider follow up with your Primary Care Provider.  ? ?________________________________________________________ ? ?The [facility] providers would like to encourage you to use [facility] to communicate with providers for non-urgent requests or questions.  Due to long hold times on the telephone, sending your provider a message by [facility] may be a faster and more efficient way to get a response.  Please allow 48 business hours for a response.  Please remember that this is for non-urgent requests.  ?_______________________________________________________ ?Due to recent changes in healthcare laws, you may see the results of your imaging and laboratory studies on MyChart before your provider has had a chance to review them.  We understand that in some cases there may be results that are confusing or concerning to you. Not all laboratory results come back in the same time frame and the provider may be waiting for multiple results in order to interpret others.  Please give [place] 48 hours in order for your provider to thoroughly review all the results before contacting the office for clarification of your results.  ?_______________________________________________ ? ?Recommend starting on a fiber supplement, can try metamucil first but  if this causes gas/bloating switch to benefiber ?Take with fiber with with a full 8 oz glass of water once a day. This can take 1 month to start helping, so try for at least one month.  ?Recommend increasing water and activity.  ?Get a squatty potty to use at home or try a stool, goal is to get your knees above your hips during a bowel movement. ? ?- Drink at least 64-80 ounces of water/liquid per day. ?- Establish a time to try to move your bowels every day.  For many people, this is after a cup of coffee or after a meal such as breakfast. ?- Sit all of the way back on the toilet keeping your back fairly straight and while sitting up, try to rest the tops of your forearms on your upper thighs.   ?- Raising your feet with a step stool/squatty potty can be helpful to improve the angle that allows your stool to pass through the rectum. ?- Relax the rectum feeling it bulge toward the toilet water.  If you feel your rectum raising toward your body, you are contracting rather than relaxing. ?- Breathe in and slowly exhale. "Belly breath" by expanding your belly towards your belly button. Keep belly expanded as you gently direct pressure down and back to the anus.  A low pitched GRRR sound can assist with increasing intra-abdominal pressure.  ?- Repeat 3-4 times. If unsuccessful, contract the pelvic floor to restore normal tone and get off the toilet.  Avoid excessive straining. ?- To reduce excessive wiping by teaching your anus to normally contract, place hands on outer  aspect of knees and resist knee movement outward.  Hold 5-10 second then place hands just inside of knees and resist inward movement of knees.  Hold 5 seconds.  Repeat a few times each way. ? ?Go to the ER if unable to pass gas, severe AB pain, unable to hold down food, any shortness of breath of chest pain.  ? ?Constipation, Adult ?Constipation is when a person has fewer than three bowel movements in a week, has difficulty having a bowel movement, or  has stools (feces) that are dry, hard, or larger than normal. Constipation may be caused by an underlying condition. It may become worse with age if a person takes certain medicines and does not take in enough fluids. ?Follow these instructions at home: ?Eating and drinking ? ?Eat foods that have a lot of fiber, such as beans, whole grains, and fresh fruits and vegetables. ?Limit foods that are low in fiber and high in fat and processed sugars, such as fried or sweet foods. These include french fries, hamburgers, cookies, candies, and soda. ?Drink enough fluid to keep your urine pale yellow. ?General instructions ?Exercise regularly or as told by your health care provider. Try to do 150 minutes of moderate exercise each week. ?Use the bathroom when you have the urge to go. Do not hold it in. ?Take over-the-counter and prescription medicines only as told by your health care provider. This includes any fiber supplements. ?During bowel movements: ?Practice deep breathing while relaxing the lower abdomen. ?Practice pelvic floor relaxation. ?Watch your condition for any changes. Let your health care provider know about them. ?Keep all follow-up visits as told by your health care provider. This is important. ?Contact a health care provider if: ?You have pain that gets worse. ?You have a fever. ?You do not have a bowel movement after 4 days. ?You vomit. ?You are not hungry or you lose weight. ?You are bleeding from the opening between the buttocks (anus). ?You have thin, pencil-like stools. ?Get help right away if: ?You have a fever and your symptoms suddenly get worse. ?You leak stool or have blood in your stool. ?Your abdomen is bloated. ?You have severe pain in your abdomen. ?You feel dizzy or you faint. ?Summary ?Constipation is when a person has fewer than three bowel movements in a week, has difficulty having a bowel movement, or has stools (feces) that are dry, hard, or larger than normal. ?Eat foods that have a lot  of fiber, such as beans, whole grains, and fresh fruits and vegetables. ?Drink enough fluid to keep your urine pale yellow. ?Take over-the-counter and prescription medicines only as told by your health care provider. This includes any fiber supplements. ?This information is not intended to replace advice given to you by your health care provider. Make sure you discuss any questions you have with your health care provider. ?Document Revised: 01/27/2019 Document Reviewed: 01/27/2019 ?Elsevier Patient Education ? [place]. ? ? ?Please take your proton pump inhibitor medication 30 minutes to 1 hour before meals- this makes it more effective.  ?Avoid spicy and acidic foods ?Avoid fatty foods ?Limit your intake of coffee, tea, alcohol, and carbonated drinks ?Work to maintain a healthy weight ?Keep the head of the bed elevated at least 3 inches with blocks or a wedge pillow if you are having any nighttime symptoms ?Stay upright for 2 hours after eating ?Avoid meals and snacks three to four hours before bedtime ?Stop smoking ? ?Gastroesophageal Reflux Disease, Adult ?Gastroesophageal reflux (GER) happens when  acid from the stomach flows up into the tube that connects the mouth and the stomach (esophagus). Normally, food travels down the esophagus and stays in the stomach to be digested. However, when a person has GER, food and stomach acid sometimes move back up into the esophagus. If this becomes a more serious problem, the person may be diagnosed with a disease called gastroesophageal reflux disease (GERD). GERD occurs when the reflux: ?Happens often. ?Causes frequent or severe symptoms. ?Causes problems such as damage to the esophagus. ?When stomach acid comes in contact with the esophagus, the acid may cause inflammation in the esophagus. Over time, GERD may create small holes (ulcers) in the lining of the esophagus. ?What are the causes? ?This condition is caused by a problem with the muscle between the  esophagus and the stomach (lower esophageal sphincter, or LES). Normally, the LES muscle closes after food passes through the esophagus to the stomach. When the LES is weakened or abnormal, it does not close

## 2021-07-04 ENCOUNTER — Other Ambulatory Visit (HOSPITAL_BASED_OUTPATIENT_CLINIC_OR_DEPARTMENT_OTHER): Payer: Self-pay

## 2021-07-04 MED ORDER — GABAPENTIN 600 MG PO TABS
900.0000 mg | ORAL_TABLET | Freq: Three times a day (TID) | ORAL | 0 refills | Status: DC
  Filled 2021-07-04: qty 135, 30d supply, fill #0

## 2021-07-10 ENCOUNTER — Ambulatory Visit: Payer: 59 | Admitting: Internal Medicine

## 2021-08-03 ENCOUNTER — Other Ambulatory Visit (HOSPITAL_BASED_OUTPATIENT_CLINIC_OR_DEPARTMENT_OTHER): Payer: Self-pay

## 2021-08-03 DIAGNOSIS — E7800 Pure hypercholesterolemia, unspecified: Secondary | ICD-10-CM | POA: Diagnosis not present

## 2021-08-03 DIAGNOSIS — E1169 Type 2 diabetes mellitus with other specified complication: Secondary | ICD-10-CM | POA: Diagnosis not present

## 2021-08-03 DIAGNOSIS — G4733 Obstructive sleep apnea (adult) (pediatric): Secondary | ICD-10-CM | POA: Diagnosis not present

## 2021-08-03 DIAGNOSIS — G629 Polyneuropathy, unspecified: Secondary | ICD-10-CM | POA: Diagnosis not present

## 2021-08-03 DIAGNOSIS — Z7985 Long-term (current) use of injectable non-insulin antidiabetic drugs: Secondary | ICD-10-CM | POA: Diagnosis not present

## 2021-08-03 DIAGNOSIS — K219 Gastro-esophageal reflux disease without esophagitis: Secondary | ICD-10-CM | POA: Diagnosis not present

## 2021-08-03 DIAGNOSIS — I10 Essential (primary) hypertension: Secondary | ICD-10-CM | POA: Diagnosis not present

## 2021-08-03 DIAGNOSIS — Z8616 Personal history of COVID-19: Secondary | ICD-10-CM | POA: Diagnosis not present

## 2021-08-03 DIAGNOSIS — Z7984 Long term (current) use of oral hypoglycemic drugs: Secondary | ICD-10-CM | POA: Diagnosis not present

## 2021-08-03 DIAGNOSIS — F320 Major depressive disorder, single episode, mild: Secondary | ICD-10-CM | POA: Diagnosis not present

## 2021-08-03 MED ORDER — SIMVASTATIN 40 MG PO TABS
ORAL_TABLET | ORAL | 1 refills | Status: DC
  Filled 2021-08-03 – 2021-10-02 (×2): qty 90, 90d supply, fill #0
  Filled 2021-12-31: qty 90, 90d supply, fill #1

## 2021-08-03 MED ORDER — ICOSAPENT ETHYL 1 G PO CAPS
ORAL_CAPSULE | ORAL | 5 refills | Status: DC
  Filled 2021-08-03: qty 120, 30d supply, fill #0
  Filled 2021-09-03: qty 120, 30d supply, fill #1
  Filled 2021-10-02: qty 120, 30d supply, fill #2
  Filled 2021-10-31: qty 120, 30d supply, fill #3
  Filled 2021-11-29: qty 120, 30d supply, fill #4
  Filled 2021-12-31: qty 120, 30d supply, fill #5

## 2021-08-03 MED ORDER — PANTOPRAZOLE SODIUM 40 MG PO TBEC
DELAYED_RELEASE_TABLET | ORAL | 1 refills | Status: DC
  Filled 2021-08-03 – 2021-09-03 (×2): qty 90, 90d supply, fill #0
  Filled 2021-11-29: qty 90, 90d supply, fill #1

## 2021-08-03 MED ORDER — LISINOPRIL 20 MG PO TABS
ORAL_TABLET | ORAL | 1 refills | Status: DC
  Filled 2021-08-03 – 2021-09-28 (×2): qty 90, 90d supply, fill #0
  Filled 2021-12-31: qty 90, 90d supply, fill #1

## 2021-08-03 MED ORDER — GABAPENTIN 600 MG PO TABS
ORAL_TABLET | ORAL | 1 refills | Status: AC
  Filled 2021-08-03: qty 405, 90d supply, fill #0
  Filled 2021-10-31: qty 405, 90d supply, fill #1

## 2021-08-07 ENCOUNTER — Encounter: Payer: Self-pay | Admitting: Internal Medicine

## 2021-08-07 ENCOUNTER — Ambulatory Visit: Payer: 59 | Admitting: Internal Medicine

## 2021-08-07 ENCOUNTER — Other Ambulatory Visit (HOSPITAL_BASED_OUTPATIENT_CLINIC_OR_DEPARTMENT_OTHER): Payer: Self-pay

## 2021-08-07 VITALS — BP 122/86 | HR 74 | Ht 70.0 in | Wt 298.8 lb

## 2021-08-07 DIAGNOSIS — E1142 Type 2 diabetes mellitus with diabetic polyneuropathy: Secondary | ICD-10-CM

## 2021-08-07 DIAGNOSIS — E785 Hyperlipidemia, unspecified: Secondary | ICD-10-CM

## 2021-08-07 DIAGNOSIS — E1165 Type 2 diabetes mellitus with hyperglycemia: Secondary | ICD-10-CM

## 2021-08-07 MED ORDER — DEXCOM G7 SENSOR MISC
3.0000 | 4 refills | Status: DC
  Filled 2021-08-07: qty 3, 30d supply, fill #0
  Filled 2022-02-25: qty 3, 30d supply, fill #1
  Filled 2022-05-27: qty 3, 30d supply, fill #2

## 2021-08-07 MED ORDER — EMPAGLIFLOZIN 25 MG PO TABS
25.0000 mg | ORAL_TABLET | Freq: Every day | ORAL | 3 refills | Status: DC
  Filled 2021-08-07: qty 90, 90d supply, fill #0
  Filled 2021-09-28 – 2021-11-18 (×2): qty 90, 90d supply, fill #1
  Filled 2022-02-25: qty 90, 90d supply, fill #2
  Filled 2022-05-27: qty 90, 90d supply, fill #3

## 2021-08-07 NOTE — Patient Instructions (Addendum)
Please increase: ?- Tresiba 40 units daily ?- Jardiance 25 mg before b'fast ? ?Continue: ?- Metformin 2000 mg with dinner ?- Trulicity 3 mg weekly ? ?Try to restart the CGM. ? ?Please return in 4 months. ?

## 2021-08-07 NOTE — Progress Notes (Signed)
Patient ID: Victor Estrada, male   DOB: [birth year], 51 y.o.   MRN: [id number]  ? ?This visit occurred during the SARS-CoV-2 public health emergency.  Safety protocols were in place, including screening questions prior to the visit, additional usage of staff PPE, and extensive cleaning of exam room while observing appropriate contact time as indicated for disinfecting solutions.  ? ?HPI: ?Victor Estrada is a 51 y.o.-year-old male, initially referred by his previous PCP, Dr. Moreen Fowler, returning for follow-up for DM2, dx in 2005, non-insulin-dependent, uncontrolled, with long-term complications (PN). His wife is also my pt: Victor Estrada. He saw Dr. Loanne Drilling in 2013.  Last visit 6 months ago. ? ?Interim history: ?No increased urination, blurry vision, nausea, chest pain. ?Started to have more stress during project at work. He also travelled - forgot insulin at home.  He also had many dietary indiscretions.  Moreover, he stopped his CGM since last visit. ? ?Reviewed latest HbA1c level: ?08/03/2021: HbA1c 9.7% ?01/16/2021: HbA1c 7.7% ?06/30/2020: HbA1c 7.7% ?Lab Results  ?Component Value Date  ? HGBA1C 10.5 12/30/2019  ? HGBA1C 9.8 04/13/2019  ? HGBA1C 11.1 (H) 11/03/2015  ? HGBA1C 10.5 (H) 02/06/2015  ? HGBA1C 6.6 (H) 09/14/2013  ? HGBA1C 6.8 (H) 05/28/2013  ? HGBA1C 10.5 (H) 01/27/2012  ? HGBA1C 9.9 (H) 10/01/2011  ? HGBA1C 9.9 (H) 05/23/2011  ?05/19/2018: HbA1c 11% ?04/01/2017: HbA1c 11.2% ? ?Previously on: ?- Janumet 50-1000 mg 2x a day, with meals ?- Jardiance 10 mg before breakfast ?He tried Glimepiride in the past. ? ?We changed to: ?- Metformin 1000 mg 2x a day with meals >> 2000 mg daily at night ?- Jardiance 10 mg before b'fast ?- Trulicity 4.01 >> 1.5 >> 3 mg - takes it <50% of the time.  ?- Tresiba 14 units (started 04/2020) >> 32 units daily ? ?He checks sugars 1-2x a day ?- am: 100, 130-160 ?- 2h after b'fast: n/c ?- lunch: n/c ?- 2h after lunch:170-320 ?- dinner: n/c ?- 2h after dinner: 170-320 ?- bedtime:  n/c ? ?Previously: ? ? ?Lowest sugar was 130  >> 95 >> 100 >> 100; he has hypoglycemia awareness in the 90s. ?Highest sugar was 350 >> 280 >> 300. ? ?Glucometer: One Touch ultra >> Freestyle ? ?Pt's meals are: ?- Breakfast: skip ?- Lunch: lean cuisine, fast food ?- Dinner: same ?- Snacks: protein bars, PB crackers, fruit ?Stopped regular sodas. ? ?-+ Mild CKD, last BUN/creatinine:  ?08/03/2021: 27/1.11, GFR 81 ?01/16/2021: 27/21.04, GFR 88, glucose 124 ?06/30/2020: 27/1.0, GFR 92, glucose 121 ?Lab Results  ?Component Value Date  ? BUN 23 (H) 11/09/2018  ? BUN 19 04/23/2016  ? CREATININE 1.09 11/09/2018  ? CREATININE 1.29 (H) 04/23/2016  ?05/19/2018: 24/1.07, GFR 74, glucose 306, otherwise normal CMP ?On lisinopril 20. ? ?-+ HL; last set of lipids: ?08/03/2021: 171/349/36/78 ?01/16/2021: 132/228/35/60 ?06/30/2020: 142/249/30/71 ?05/19/2018: 368/1181/33/not calculated ?Lab Results  ?Component Value Date  ? CHOL 151 11/03/2015  ? HDL 37 (L) 11/03/2015  ? [place] 48 11/03/2015  ? LDLDIRECT 92.0 02/06/2015  ? TRIG 330 (H) 11/03/2015  ? CHOLHDL 4.1 11/03/2015  ?On Zocor 40, Vascepa added 04/2018. ? ?- last eye exam was in 2021: No DR reportedly. [facility]. ? ?-+ Numbness and tingling in his feet.  On Neurontin 600 >> 900 mg 2x >> 3x a day.  He had foot exam last year by Dr. Littie Deeds.  He cannot remember exactly when this was done. ? ?Pt has FH of DM in father. ? ?She also has a  history of HTN, GERD, OSA, obesity class III, kidney stones. ? ?He has a family history of bile duct cancer in mother. ? ?ROS: ?+ see HPI ?Gastrointestinal: no N/no V/no D/no C/+ acid reflux ?Neurological: no tremors/+ numbness/+ tingling/no dizziness ? ?I reviewed pt's medications, allergies, PMH, social hx, family hx, and changes were documented in the history of present illness. Otherwise, unchanged from my initial visit note. ? ?Past Medical History:  ?Diagnosis Date  ? Anxiety   ? Complication of anesthesia   ? Constipation   ? Depression   ?  Diabetes mellitus   ? type 2  ? GERD (gastroesophageal reflux disease)   ? Head injury, acute, without loss of consciousness   ? History of kidney stones   ? Hyperlipidemia   ? Hypertension   ? Kidney stone   ? MDD (major depressive disorder)   ? Peripheral neuropathy   ? PONV (postoperative nausea and vomiting)   ? post wisdom teeth  ? Shortness of breath   ? with exertion  ? Sinus disorder   ? Sleep apnea   ? ?Past Surgical History:  ?Procedure Laterality Date  ? DENTAL SURGERY    ? INCISION AND DRAINAGE ABSCESS Left 11/12/2013  ? Procedure: INCISION AND DRAINAGE LEFT CHEEK  ABSCESS;  Surgeon: Melida Quitter, MD;  Location: [place];  Service: ENT;  Laterality: Left;  ? NASAL SEPTOPLASTY W/ TURBINOPLASTY N/A 04/26/2016  ? Procedure: NASAL SEPTOPLASTY WITH TURBINATE REDUCTION;  Surgeon: Melida Quitter, MD;  Location: [place];  Service: ENT;  Laterality: N/A;  ? WISDOM TOOTH EXTRACTION    ? ?Social History  ? ?Socioeconomic History  ? Marital status: Married  ?Occupational History  ?Social Needs  ? Financial resource strain: Not on file  ? Food insecurity:  ?  Worry: Not on file  ?  Inability: Not on file  ? Transportation needs:  ?  Medical: Not on file  ?  Non-medical: Not on file  ?Tobacco Use  ? Smoking status: Never Smoker  ? Smokeless tobacco: Never Used  ?Substance and Sexual Activity  ? Alcohol use: No  ? Drug use: No  ? Sexual activity: Not on file  ?Lifestyle  ? Physical activity:  ?  Days per week: Not on file  ?  Minutes per session: Not on file  ? Stress: Not on file  ?Relationships  ? Social connections:  ?  Talks on phone: Not on file  ?  Gets together: Not on file  ?  Attends religious service: Not on file  ?  Active member of club or organization: Not on file  ?  Attends meetings of clubs or organizations: Not on file  ?  Relationship status: Not on file  ? Intimate partner violence:  ?  Fear of current or ex partner: Not on file  ?  Emotionally abused: Not on file  ?  Physically abused: Not on file  ?  Forced  sexual activity: Not on file  ?Other Topics Concern  ? Not on file  ?Social History Narrative  ? Married 1 son  ? Dance movement psychotherapist  ? 2-3 caffeine drinks/day  ? ?Current Outpatient Medications on File Prior to Visit  ?Medication Sig Dispense Refill  ? acetaminophen (TYLENOL) 500 MG tablet Take 1,000 mg by mouth every 6 (six) hours as needed (for pain).     ? Dulaglutide (TRULICITY) 3 EL/3.8BO SOPN Inject 3 mg into the skin once a week. 6 mL 3  ? empagliflozin (JARDIANCE) 10 MG TABS  tablet Take 1 tablet by mouth every morning 90 tablet 1  ? fexofenadine (ALLEGRA) 180 MG tablet Take by mouth.    ? gabapentin (NEURONTIN) 600 MG tablet Take 1.5 tablets (900 mg total) by mouth every 8 (eight) hours. 135 tablet 2  ? gabapentin (NEURONTIN) 600 MG tablet Take 1 & 1/2 tablets by mouth every 8 hours 405 tablet 1  ? ibuprofen (ADVIL) 200 MG tablet Take 200 mg by mouth every 6 (six) hours as needed.    ? icosapent Ethyl (VASCEPA) 1 g capsule Take 2 capsules by mouth 2 times daily with meals 120 capsule 5  ? insulin degludec (TRESIBA FLEXTOUCH) 200 UNIT/ML FlexTouch Pen Inject up to 36 units into the skin daily. 9 mL 1  ? Insulin Pen Needle (ULTICARE MICRO PEN NEEDLES) 32G X 4 MM MISC USE ONCE A DAY AS DIRECTED 100 each 3  ? lisinopril (ZESTRIL) 20 MG tablet Take 1 tablet by mouth once daily 90 tablet 1  ? metFORMIN (GLUCOPHAGE) 1000 MG tablet Take 1 tablet (1,000 mg total) by mouth 2 (two) times daily with a meal. 180 tablet 1  ? Multiple Vitamin (MULTIVITAMIN) tablet Take 1 tablet by mouth daily.    ? pantoprazole (PROTONIX) 40 MG tablet Take 1 tablet by mouth once daily 90 tablet 1  ? PEG-KCl-NaCl-NaSulf-Na Asc-C (PLENVU) 140 g SOLR Take 1 kit by mouth as directed. Use coupon: BIN: [id number] PNC: CNRX Group: [id number] ID: [id number] 3 each 0  ? simvastatin (ZOCOR) 40 MG tablet TAKE 1 TABLET BY MOUTH ONCE DAILY 90 tablet 1  ? simvastatin (ZOCOR) 40 MG tablet Take 1 tablet by mouth in the evening once daily 90 tablet 1  ? ?No  current facility-administered medications on file prior to visit.  ? ?No Known Allergies ?Family History  ?Problem Relation Age of Onset  ? Cancer Mother   ? Hyperlipidemia Father   ? Heart disease Father   ? Dia

## 2021-08-08 ENCOUNTER — Other Ambulatory Visit (HOSPITAL_BASED_OUTPATIENT_CLINIC_OR_DEPARTMENT_OTHER): Payer: Self-pay

## 2021-08-14 ENCOUNTER — Encounter: Payer: Self-pay | Admitting: Internal Medicine

## 2021-08-14 ENCOUNTER — Ambulatory Visit (AMBULATORY_SURGERY_CENTER): Payer: 59 | Admitting: Internal Medicine

## 2021-08-14 VITALS — BP 105/68 | HR 73 | Temp 97.5°F | Resp 18 | Ht 70.0 in | Wt 299.0 lb

## 2021-08-14 DIAGNOSIS — D128 Benign neoplasm of rectum: Secondary | ICD-10-CM

## 2021-08-14 DIAGNOSIS — Z1211 Encounter for screening for malignant neoplasm of colon: Secondary | ICD-10-CM | POA: Diagnosis not present

## 2021-08-14 DIAGNOSIS — R1319 Other dysphagia: Secondary | ICD-10-CM | POA: Diagnosis not present

## 2021-08-14 DIAGNOSIS — K2970 Gastritis, unspecified, without bleeding: Secondary | ICD-10-CM

## 2021-08-14 DIAGNOSIS — K319 Disease of stomach and duodenum, unspecified: Secondary | ICD-10-CM | POA: Diagnosis not present

## 2021-08-14 DIAGNOSIS — K621 Rectal polyp: Secondary | ICD-10-CM | POA: Diagnosis not present

## 2021-08-14 DIAGNOSIS — K219 Gastro-esophageal reflux disease without esophagitis: Secondary | ICD-10-CM | POA: Diagnosis not present

## 2021-08-14 MED ORDER — SODIUM CHLORIDE 0.9 % IV SOLN: 500.0000 mL | Freq: Once | INTRAVENOUS | Status: DC

## 2021-08-14 NOTE — Progress Notes (Signed)
VS-CW  Pt's states no medical or surgical changes since previsit or office visit.  

## 2021-08-14 NOTE — Op Note (Signed)
[facility] Patient Name: Phillips Goulette Procedure Date: 08/14/2021 2:10 PM MRN: [id number] Endoscopist: Gatha Mayer , MD Age: 51 Referring MD:  Date of Birth: October 15, 1970 Gender: Male Account #: [id number] Procedure:                Upper GI endoscopy Indications:              Dysphagia Medicines:                Monitored Anesthesia Care Procedure:                Pre-Anesthesia Assessment:                           - Prior to the procedure, a History and Physical                            was performed, and patient medications and                            allergies were reviewed. The patient's tolerance of                            previous anesthesia was also reviewed. The risks                            and benefits of the procedure and the sedation                            options and risks were discussed with the patient.                            All questions were answered, and informed consent                            was obtained. Prior Anticoagulants: The patient has                            taken no previous anticoagulant or antiplatelet                            agents. ASA Grade Assessment: III - A patient with                            severe systemic disease. After reviewing the risks                            and benefits, the patient was deemed in                            satisfactory condition to undergo the procedure.                           After obtaining informed consent, the endoscope was  passed under direct vision. Throughout the                            procedure, the patient's blood pressure, pulse, and                            oxygen saturations were monitored continuously. The                            Endoscope was introduced through the mouth, and                            advanced to the second part of duodenum. The upper                            GI endoscopy was accomplished without  difficulty.                            The patient tolerated the procedure well. Scope In: Scope Out: Findings:                 No endoscopic abnormality was evident in the                            esophagus to explain the patient's complaint of                            dysphagia. It was decided, however, to proceed with                            dilation of the entire esophagus. The scope was                            withdrawn. Dilation was performed with a Maloney                            dilator with mild resistance at 16 Fr. The dilation                            site was examined following endoscope reinsertion                            and showed no change. Estimated blood loss: none.                           Diffuse mild inflammation characterized by                            erythema, friability and granularity was found in                            the gastric body and in the gastric antrum.  Biopsies were taken with a cold forceps for                            histology. Verification of patient identification                            for the specimen was done. Estimated blood loss was                            minimal.                           The exam was otherwise without abnormality.                           The cardia and gastric fundus were normal on                            retroflexion. Complications:            No immediate complications. Estimated Blood Loss:     Estimated blood loss was minimal. Impression:               - No endoscopic esophageal abnormality to explain                            patient's dysphagia. Esophagus dilated. Dilated.                           - Gastritis. Biopsied.                           - The examination was otherwise normal except                            mildly irregular Z-line Recommendation:           - Patient has a contact number available for                             emergencies. The signs and symptoms of potential                            delayed complications were discussed with the                            patient. Return to normal activities tomorrow.                            Written discharge instructions were provided to the                            patient.                           - Continue present medications.                           -  Await pathology results.                           - Clear liquids x 1 hour then soft foods rest of                            day. Start prior diet tomorrow. Gatha Mayer, MD 08/14/2021 3:00:33 PM This report has been signed electronically.

## 2021-08-14 NOTE — Progress Notes (Signed)
Called to room to assist during endoscopic procedure.  Patient ID and intended procedure confirmed with present staff. Received instructions for my participation in the procedure from the performing physician.  

## 2021-08-14 NOTE — Progress Notes (Signed)
Pt in recovery with monitors in place, VSS. Report given to receiving RN. Bite guard was placed with pt awake to ensure comfort. No dental or soft tissue damage noted. 

## 2021-08-14 NOTE — Progress Notes (Signed)
[facility] History and Physical   Primary Care Physician:  Antony Contras, MD   Reason for Procedure:   Dysphagia and CRCA screen  Plan:    EGD, dilate esophagus and screen for colon cancer     HPI: Victor Estrada is a 51 y.o. male here to evaluate dysphaguia and to screen for colon cancer   Past Medical History:  Diagnosis Date   Anxiety    Complication of anesthesia    Constipation    Depression    Diabetes mellitus    type 2   GERD (gastroesophageal reflux disease)    Head injury, acute, without loss of consciousness    History of kidney stones    Hyperlipidemia    Hypertension    Kidney stone    MDD (major depressive disorder)    Peripheral neuropathy    PONV (postoperative nausea and vomiting)    post wisdom teeth   Shortness of breath    with exertion   Sinus disorder    Sleep apnea     Past Surgical History:  Procedure Laterality Date   DENTAL SURGERY     INCISION AND DRAINAGE ABSCESS Left 11/12/2013   Procedure: INCISION AND DRAINAGE LEFT CHEEK  ABSCESS;  Surgeon: Melida Quitter, MD;  Location: [facility];  Service: ENT;  Laterality: Left;   NASAL SEPTOPLASTY W/ TURBINOPLASTY N/A 04/26/2016   Procedure: NASAL SEPTOPLASTY WITH TURBINATE REDUCTION;  Surgeon: Melida Quitter, MD;  Location: [facility];  Service: ENT;  Laterality: N/A;   WISDOM TOOTH EXTRACTION      Prior to Admission medications   Medication Sig Start Date End Date Taking? Authorizing Provider  acetaminophen (TYLENOL) 500 MG tablet Take 1,000 mg by mouth every 6 (six) hours as needed (for pain).    Yes [provider]  Continuous Blood Gluc Sensor (DEXCOM G7 SENSOR) MISC Apply 1 sensor every 10 days as directed 08/07/21  Yes Philemon Kingdom, MD  Dulaglutide (TRULICITY) 3 CH/8.5ID SOPN Inject 3 mg into the skin once a week. 08/11/20  Yes Philemon Kingdom, MD  empagliflozin (JARDIANCE) 25 MG TABS tablet Take 1 tablet (25 mg total) by mouth daily before breakfast. 08/07/21  Yes Philemon Kingdom, MD  gabapentin (NEURONTIN) 600 MG tablet Take 1.5 tablets (900 mg total) by mouth every 8 (eight) hours. 12/28/20  Yes Penumalli, Earlean Polka, MD  ibuprofen (ADVIL) 200 MG tablet Take 200 mg by mouth every 6 (six) hours as needed.   Yes [provider]  icosapent Ethyl (VASCEPA) 1 g capsule Take 2 capsules by mouth 2 times daily with meals 08/03/21  Yes   insulin degludec (TRESIBA FLEXTOUCH) 200 UNIT/ML FlexTouch Pen Inject up to 36 units into the skin daily. 01/29/21  Yes Philemon Kingdom, MD  lisinopril (ZESTRIL) 20 MG tablet Take 1 tablet by mouth once daily 08/03/21  Yes   metFORMIN (GLUCOPHAGE) 1000 MG tablet Take 1 tablet (1,000 mg total) by mouth 2 (two) times daily with a meal. 02/09/21 10/02/21 Yes Philemon Kingdom, MD  Multiple Vitamin (MULTIVITAMIN) tablet Take 1 tablet by mouth daily.   Yes [provider]  pantoprazole (PROTONIX) 40 MG tablet Take 1 tablet by mouth once daily 08/03/21  Yes   simvastatin (ZOCOR) 40 MG tablet Take 1 tablet by mouth in the evening once daily 08/03/21  Yes   fexofenadine (ALLEGRA) 180 MG tablet Take by mouth.    [provider]  gabapentin (NEURONTIN) 600 MG tablet Take 1 & 1/2 tablets by mouth every 8 hours  08/03/21     Insulin Pen Needle (ULTICARE MICRO PEN NEEDLES) 32G X 4 MM MISC USE ONCE A DAY AS DIRECTED 03/09/21 03/09/22  Philemon Kingdom, MD  simvastatin (ZOCOR) 40 MG tablet TAKE 1 TABLET BY MOUTH ONCE DAILY 12/30/19 12/29/20  Antony Contras, MD    Current Outpatient Medications  Medication Sig Dispense Refill   acetaminophen (TYLENOL) 500 MG tablet Take 1,000 mg by mouth every 6 (six) hours as needed (for pain).      Continuous Blood Gluc Sensor (DEXCOM G7 SENSOR) MISC Apply 1 sensor every 10 days as directed 9 each 4   Dulaglutide (TRULICITY) 3 ZO/1.0RU SOPN Inject 3 mg into the skin once a week. 6 mL 3   empagliflozin (JARDIANCE) 25 MG TABS tablet Take 1 tablet (25 mg total) by mouth daily before breakfast. 90 tablet  3   gabapentin (NEURONTIN) 600 MG tablet Take 1.5 tablets (900 mg total) by mouth every 8 (eight) hours. 135 tablet 2   ibuprofen (ADVIL) 200 MG tablet Take 200 mg by mouth every 6 (six) hours as needed.     icosapent Ethyl (VASCEPA) 1 g capsule Take 2 capsules by mouth 2 times daily with meals 120 capsule 5   insulin degludec (TRESIBA FLEXTOUCH) 200 UNIT/ML FlexTouch Pen Inject up to 36 units into the skin daily. 9 mL 1   lisinopril (ZESTRIL) 20 MG tablet Take 1 tablet by mouth once daily 90 tablet 1   metFORMIN (GLUCOPHAGE) 1000 MG tablet Take 1 tablet (1,000 mg total) by mouth 2 (two) times daily with a meal. 180 tablet 1   Multiple Vitamin (MULTIVITAMIN) tablet Take 1 tablet by mouth daily.     pantoprazole (PROTONIX) 40 MG tablet Take 1 tablet by mouth once daily 90 tablet 1   simvastatin (ZOCOR) 40 MG tablet Take 1 tablet by mouth in the evening once daily 90 tablet 1   fexofenadine (ALLEGRA) 180 MG tablet Take by mouth.     gabapentin (NEURONTIN) 600 MG tablet Take 1 & 1/2 tablets by mouth every 8 hours 405 tablet 1   Insulin Pen Needle (ULTICARE MICRO PEN NEEDLES) 32G X 4 MM MISC USE ONCE A DAY AS DIRECTED 100 each 3   simvastatin (ZOCOR) 40 MG tablet TAKE 1 TABLET BY MOUTH ONCE DAILY 90 tablet 1   Current Facility-Administered Medications  Medication Dose Route Frequency Provider Last Rate Last Admin   0.9 %  sodium chloride infusion  500 mL Intravenous Once Gatha Mayer, MD        Allergies as of 08/14/2021   (No Known Allergies)    Family History  Problem Relation Age of Onset   Cancer Mother    Hyperlipidemia Father    Heart disease Father    Diabetes Father    Heart disease Maternal Grandmother    Diabetes Paternal Grandmother    Stomach cancer Maternal Aunt    Colon cancer Neg Hx    Esophageal cancer Neg Hx    Colon polyps Neg Hx     Social History   Socioeconomic History   Marital status: Married    Spouse name: Tammy   Number of children: 1   Years of  education: Not on file   Highest education level: Some college, no degree  Occupational History    Comment: Dance movement psychotherapist  Tobacco Use   Smoking status: Never   Smokeless tobacco: Never  Vaping Use   Vaping Use: Never used  Substance and Sexual Activity   Alcohol use:  No   Drug use: No   Sexual activity: Not on file  Other Topics Concern   Not on file  Social History Narrative   Married 1 son   Dance movement psychotherapist   2-3 caffeine drinks/day   Right Handed   Lives in a two story home   Social Determinants of Health   Financial Resource Strain: Not on file  Food Insecurity: Not on file  Transportation Needs: Not on file  Physical Activity: Not on file  Stress: Not on file  Social Connections: Not on file  Intimate Partner Violence: Not on file    Review of Systems:  All other review of systems negative except as mentioned in the HPI.  Physical Exam: Vital signs BP (!) 144/87   Pulse 95   Temp (!) 97.5 F (36.4 C)   Ht '5\' 10"'$  (1.778 m)   Wt 299 lb (135.6 kg)   SpO2 99%   BMI 42.90 kg/m   General:   Alert,  Well-developed, well-nourished, pleasant and cooperative in NAD Lungs:  Clear throughout to auscultation.   Heart:  Regular rate and rhythm; no murmurs, clicks, rubs,  or gallops. Abdomen:  Soft, nontender and nondistended. Normal bowel sounds.   Neuro/Psych:  Alert and cooperative. Normal mood and affect. A and O x 3   '@Chalyn Amescua'$  Simonne Maffucci, MD, [facility] [phone] (pager) 08/14/2021 2:20 PM@

## 2021-08-14 NOTE — Op Note (Signed)
[facility] Patient Name: Victor Estrada Procedure Date: 08/14/2021 2:08 PM MRN: [id number] Endoscopist: Gatha Mayer , MD Age: 51 Referring MD:  Date of Birth: 06/17/1970 Gender: Male Account #: [id number] Procedure:                Colonoscopy Indications:              Screening for colorectal malignant neoplasm, This                            is the patient's first colonoscopy Medicines:                Monitored Anesthesia Care Procedure:                Pre-Anesthesia Assessment:                           - Prior to the procedure, a History and Physical                            was performed, and patient medications and                            allergies were reviewed. The patient's tolerance of                            previous anesthesia was also reviewed. The risks                            and benefits of the procedure and the sedation                            options and risks were discussed with the patient.                            All questions were answered, and informed consent                            was obtained. Prior Anticoagulants: The patient has                            taken no previous anticoagulant or antiplatelet                            agents. ASA Grade Assessment: III - A patient with                            severe systemic disease. After reviewing the risks                            and benefits, the patient was deemed in                            satisfactory condition to undergo the procedure.  After obtaining informed consent, the colonoscope                            was passed under direct vision. Throughout the                            procedure, the patient's blood pressure, pulse, and                            oxygen saturations were monitored continuously. The                            CF [id number] #[id number] was introduced through the anus                            and advanced to the the  cecum, identified by                            appendiceal orifice and ileocecal valve. The                            colonoscopy was performed without difficulty. The                            patient tolerated the procedure well. The quality                            of the bowel preparation was excellent. The                            ileocecal valve, appendiceal orifice, and rectum                            were photographed. The bowel preparation used was                            Plenvu via split dose instruction. Scope In: 2:40:06 PM Scope Out: 2:53:22 PM Scope Withdrawal Time: 0 hours 10 minutes 59 seconds  Total Procedure Duration: 0 hours 13 minutes 16 seconds  Findings:                 The perianal and digital rectal examinations were                            normal. Pertinent negatives include normal prostate                            (size, shape, and consistency).                           A diminutive polyp was found in the proximal                            rectum. The polyp was sessile. The polyp was  removed with a cold snare. Resection and retrieval                            were complete. Verification of patient                            identification for the specimen was done. Estimated                            blood loss was minimal.                           The exam was otherwise without abnormality on                            direct and retroflexion views. Complications:            No immediate complications. Estimated Blood Loss:     Estimated blood loss was minimal. Impression:               - One diminutive polyp in the proximal rectum,                            removed with a cold snare. Resected and retrieved.                           - The examination was otherwise normal on direct                            and retroflexion views. Recommendation:           - Patient has a contact number available for                             emergencies. The signs and symptoms of potential                            delayed complications were discussed with the                            patient. Return to normal activities tomorrow.                            Written discharge instructions were provided to the                            patient.                           - Clear liquids x 1 hour then soft foods rest of                            day. Start prior diet tomorrow.                           - Continue present medications.                           -  Await pathology results.                           - Repeat colonoscopy is recommended. The                            colonoscopy date will be determined after pathology                            results from today's exam become available for                            review. Gatha Mayer, MD 08/14/2021 3:03:21 PM This report has been signed electronically.

## 2021-08-14 NOTE — Patient Instructions (Addendum)
I did not see any stricture or narrow areas in the esophagus - I did dilated it to see if that helps.  The stomach lining looked mildly inflamed - biopsies taken.  One tiny polyp removed from rectum.  I will let you know pathology results and when to have another routine colonoscopy by mail and/or My Chart.  I appreciate the opportunity to care for you. Gatha Mayer, MD, [facility]   HANDOUTS ON POLYPS AND DILATION DIET INSTRUCTIONS GIVEN.  YOU HAD AN ENDOSCOPIC PROCEDURE TODAY AT [facility]:   Refer to the procedure report that was given to you for any specific questions about what was found during the examination.  If the procedure report does not answer your questions, please call your gastroenterologist to clarify.  If you requested that your care partner not be given the details of your procedure findings, then the procedure report has been included in a sealed envelope for you to review at your convenience later.  YOU SHOULD EXPECT: Some feelings of bloating in the abdomen. Passage of more gas than usual.  Walking can help get rid of the air that was put into your GI tract during the procedure and reduce the bloating. If you had a lower endoscopy (such as a colonoscopy or flexible sigmoidoscopy) you may notice spotting of blood in your stool or on the toilet paper. If you underwent a bowel prep for your procedure, you may not have a normal bowel movement for a few days.  Please Note:  You might notice some irritation and congestion in your nose or some drainage.  This is from the oxygen used during your procedure.  There is no need for concern and it should clear up in a day or so.  SYMPTOMS TO REPORT IMMEDIATELY:  Following lower endoscopy (colonoscopy or flexible sigmoidoscopy):  Excessive amounts of blood in the stool  Significant tenderness or worsening of abdominal pains  Swelling of the abdomen that is new, acute  Fever of 100F or higher  Following upper  endoscopy (EGD)  Vomiting of blood or coffee ground material  New chest pain or pain under the shoulder blades  Painful or persistently difficult swallowing  New shortness of breath  Fever of 100F or higher  Black, tarry-looking stools  For urgent or emergent issues, a gastroenterologist can be reached at any hour by calling [phone]. Do not use MyChart messaging for urgent concerns.    DIET:  We do recommend a small meal at first, but then you may proceed to your regular diet.  Drink plenty of fluids but you should avoid alcoholic beverages for 24 hours.  ACTIVITY:  You should plan to take it easy for the rest of today and you should NOT DRIVE or use heavy machinery until tomorrow (because of the sedation medicines used during the test).    FOLLOW UP: Our staff will call the number listed on your records 48-72 hours following your procedure to check on you and address any questions or concerns that you may have regarding the information given to you following your procedure. If we do not reach you, we will leave a message.  We will attempt to reach you two times.  During this call, we will ask if you have developed any symptoms of COVID 19. If you develop any symptoms (ie: fever, flu-like symptoms, shortness of breath, cough etc.) before then, please call [phone].  If you test positive for Covid 19 in the 2 weeks post procedure,  please call and report this information to [place].    If any biopsies were taken you will be contacted by phone or by letter within the next 1-3 weeks.  Please call us at [phone] if you have not heard about the biopsies in 3 weeks.    SIGNATURES/CONFIDENTIALITY: You and/or your care partner have signed paperwork which will be entered into your electronic medical record.  These signatures attest to the fact that that the information above on your After Visit Summary has been reviewed and is understood.  Full responsibility of the confidentiality of this  discharge information lies with you and/or your care-partner.

## 2021-08-15 ENCOUNTER — Telehealth: Payer: Self-pay | Admitting: *Deleted

## 2021-08-15 ENCOUNTER — Telehealth: Payer: Self-pay

## 2021-08-15 NOTE — Telephone Encounter (Signed)
Attempted f/u phone call. No answer. Left message. °

## 2021-08-15 NOTE — Telephone Encounter (Signed)
Second attempt follow up call to pt, lm for pt to call if having any problems or questions.

## 2021-08-28 ENCOUNTER — Encounter: Payer: Self-pay | Admitting: Internal Medicine

## 2021-09-03 ENCOUNTER — Other Ambulatory Visit (HOSPITAL_BASED_OUTPATIENT_CLINIC_OR_DEPARTMENT_OTHER): Payer: Self-pay

## 2021-09-28 ENCOUNTER — Other Ambulatory Visit (HOSPITAL_BASED_OUTPATIENT_CLINIC_OR_DEPARTMENT_OTHER): Payer: Self-pay

## 2021-10-02 ENCOUNTER — Other Ambulatory Visit (HOSPITAL_BASED_OUTPATIENT_CLINIC_OR_DEPARTMENT_OTHER): Payer: Self-pay

## 2021-10-03 ENCOUNTER — Other Ambulatory Visit (HOSPITAL_BASED_OUTPATIENT_CLINIC_OR_DEPARTMENT_OTHER): Payer: Self-pay

## 2021-10-08 ENCOUNTER — Other Ambulatory Visit: Payer: Self-pay | Admitting: Internal Medicine

## 2021-10-08 ENCOUNTER — Other Ambulatory Visit (HOSPITAL_BASED_OUTPATIENT_CLINIC_OR_DEPARTMENT_OTHER): Payer: Self-pay

## 2021-10-08 DIAGNOSIS — E1142 Type 2 diabetes mellitus with diabetic polyneuropathy: Secondary | ICD-10-CM

## 2021-10-08 MED ORDER — TRESIBA FLEXTOUCH 200 UNIT/ML ~~LOC~~ SOPN
36.0000 [IU] | PEN_INJECTOR | Freq: Every day | SUBCUTANEOUS | 1 refills | Status: DC
  Filled 2021-10-08: qty 9, 50d supply, fill #0
  Filled 2021-11-18: qty 9, 50d supply, fill #1

## 2021-10-08 MED ORDER — METFORMIN HCL 1000 MG PO TABS
1000.0000 mg | ORAL_TABLET | Freq: Two times a day (BID) | ORAL | 1 refills | Status: DC
  Filled 2021-10-08: qty 180, 90d supply, fill #0
  Filled 2021-12-31: qty 180, 90d supply, fill #1

## 2021-10-09 ENCOUNTER — Other Ambulatory Visit (HOSPITAL_BASED_OUTPATIENT_CLINIC_OR_DEPARTMENT_OTHER): Payer: Self-pay

## 2021-10-23 DIAGNOSIS — F4320 Adjustment disorder, unspecified: Secondary | ICD-10-CM | POA: Diagnosis not present

## 2021-10-29 DIAGNOSIS — F4320 Adjustment disorder, unspecified: Secondary | ICD-10-CM | POA: Diagnosis not present

## 2021-10-31 ENCOUNTER — Other Ambulatory Visit (HOSPITAL_BASED_OUTPATIENT_CLINIC_OR_DEPARTMENT_OTHER): Payer: Self-pay

## 2021-11-19 ENCOUNTER — Other Ambulatory Visit (HOSPITAL_BASED_OUTPATIENT_CLINIC_OR_DEPARTMENT_OTHER): Payer: Self-pay

## 2021-11-19 ENCOUNTER — Other Ambulatory Visit: Payer: Self-pay | Admitting: Internal Medicine

## 2021-11-19 MED ORDER — TRULICITY 3 MG/0.5ML ~~LOC~~ SOAJ
3.0000 mg | SUBCUTANEOUS | 3 refills | Status: DC
  Filled 2021-11-19: qty 6, 84d supply, fill #0

## 2021-11-29 ENCOUNTER — Other Ambulatory Visit (HOSPITAL_BASED_OUTPATIENT_CLINIC_OR_DEPARTMENT_OTHER): Payer: Self-pay

## 2021-12-18 ENCOUNTER — Ambulatory Visit: Payer: 59 | Admitting: Internal Medicine

## 2021-12-31 ENCOUNTER — Other Ambulatory Visit (HOSPITAL_BASED_OUTPATIENT_CLINIC_OR_DEPARTMENT_OTHER): Payer: Self-pay

## 2022-01-21 ENCOUNTER — Other Ambulatory Visit (HOSPITAL_BASED_OUTPATIENT_CLINIC_OR_DEPARTMENT_OTHER): Payer: Self-pay

## 2022-01-21 ENCOUNTER — Other Ambulatory Visit: Payer: Self-pay | Admitting: Internal Medicine

## 2022-01-21 DIAGNOSIS — E1165 Type 2 diabetes mellitus with hyperglycemia: Secondary | ICD-10-CM

## 2022-01-21 MED ORDER — TRESIBA FLEXTOUCH 200 UNIT/ML ~~LOC~~ SOPN
36.0000 [IU] | PEN_INJECTOR | Freq: Every day | SUBCUTANEOUS | 1 refills | Status: DC
  Filled 2022-01-21: qty 9, 50d supply, fill #0
  Filled 2022-03-07: qty 9, 50d supply, fill #1

## 2022-01-30 DIAGNOSIS — E7800 Pure hypercholesterolemia, unspecified: Secondary | ICD-10-CM | POA: Diagnosis not present

## 2022-01-30 DIAGNOSIS — Z125 Encounter for screening for malignant neoplasm of prostate: Secondary | ICD-10-CM | POA: Diagnosis not present

## 2022-01-31 ENCOUNTER — Other Ambulatory Visit (HOSPITAL_BASED_OUTPATIENT_CLINIC_OR_DEPARTMENT_OTHER): Payer: Self-pay

## 2022-01-31 DIAGNOSIS — K219 Gastro-esophageal reflux disease without esophagitis: Secondary | ICD-10-CM | POA: Diagnosis not present

## 2022-01-31 DIAGNOSIS — Z87898 Personal history of other specified conditions: Secondary | ICD-10-CM | POA: Diagnosis not present

## 2022-01-31 DIAGNOSIS — E7800 Pure hypercholesterolemia, unspecified: Secondary | ICD-10-CM | POA: Diagnosis not present

## 2022-01-31 DIAGNOSIS — I10 Essential (primary) hypertension: Secondary | ICD-10-CM | POA: Diagnosis not present

## 2022-01-31 DIAGNOSIS — G629 Polyneuropathy, unspecified: Secondary | ICD-10-CM | POA: Diagnosis not present

## 2022-01-31 DIAGNOSIS — J029 Acute pharyngitis, unspecified: Secondary | ICD-10-CM | POA: Diagnosis not present

## 2022-01-31 DIAGNOSIS — G4709 Other insomnia: Secondary | ICD-10-CM | POA: Diagnosis not present

## 2022-01-31 DIAGNOSIS — E1169 Type 2 diabetes mellitus with other specified complication: Secondary | ICD-10-CM | POA: Diagnosis not present

## 2022-01-31 DIAGNOSIS — Z0000 Encounter for general adult medical examination without abnormal findings: Secondary | ICD-10-CM | POA: Diagnosis not present

## 2022-01-31 MED ORDER — SIMVASTATIN 40 MG PO TABS
40.0000 mg | ORAL_TABLET | Freq: Every evening | ORAL | 1 refills | Status: DC
  Filled 2022-01-31 – 2022-04-01 (×2): qty 90, 90d supply, fill #0
  Filled 2022-07-03: qty 90, 90d supply, fill #1

## 2022-01-31 MED ORDER — LISINOPRIL 20 MG PO TABS
20.0000 mg | ORAL_TABLET | Freq: Every day | ORAL | 2 refills | Status: AC
  Filled 2022-01-31 – 2022-04-01 (×2): qty 30, 30d supply, fill #0
  Filled 2022-04-29: qty 30, 30d supply, fill #1
  Filled 2022-05-27: qty 30, 30d supply, fill #2

## 2022-01-31 MED ORDER — GABAPENTIN 600 MG PO TABS
900.0000 mg | ORAL_TABLET | Freq: Three times a day (TID) | ORAL | 2 refills | Status: DC
  Filled 2022-01-31 – 2022-02-04 (×2): qty 135, 30d supply, fill #0
  Filled 2022-03-08: qty 135, 30d supply, fill #1

## 2022-01-31 MED ORDER — PANTOPRAZOLE SODIUM 40 MG PO TBEC
40.0000 mg | DELAYED_RELEASE_TABLET | Freq: Every day | ORAL | 1 refills | Status: DC
  Filled 2022-01-31 – 2022-02-25 (×2): qty 90, 90d supply, fill #0
  Filled 2022-04-01 – 2022-05-27 (×2): qty 90, 90d supply, fill #1

## 2022-02-04 ENCOUNTER — Other Ambulatory Visit (HOSPITAL_BASED_OUTPATIENT_CLINIC_OR_DEPARTMENT_OTHER): Payer: Self-pay

## 2022-02-05 ENCOUNTER — Other Ambulatory Visit (HOSPITAL_BASED_OUTPATIENT_CLINIC_OR_DEPARTMENT_OTHER): Payer: Self-pay

## 2022-02-07 ENCOUNTER — Other Ambulatory Visit (HOSPITAL_BASED_OUTPATIENT_CLINIC_OR_DEPARTMENT_OTHER): Payer: Self-pay

## 2022-02-07 MED ORDER — ICOSAPENT ETHYL 1 G PO CAPS
2.0000 g | ORAL_CAPSULE | Freq: Two times a day (BID) | ORAL | 6 refills | Status: DC
  Filled 2022-02-07: qty 120, 30d supply, fill #0
  Filled 2022-03-06: qty 120, 30d supply, fill #1
  Filled 2022-04-01: qty 120, 30d supply, fill #2
  Filled 2022-05-08: qty 120, 30d supply, fill #3
  Filled 2022-06-05: qty 120, 30d supply, fill #4
  Filled 2022-07-03: qty 120, 30d supply, fill #5
  Filled 2022-08-02: qty 120, 30d supply, fill #6

## 2022-02-26 ENCOUNTER — Other Ambulatory Visit (HOSPITAL_BASED_OUTPATIENT_CLINIC_OR_DEPARTMENT_OTHER): Payer: Self-pay

## 2022-03-06 ENCOUNTER — Encounter: Payer: Self-pay | Admitting: Internal Medicine

## 2022-03-06 ENCOUNTER — Ambulatory Visit: Payer: 59 | Admitting: Internal Medicine

## 2022-03-06 ENCOUNTER — Other Ambulatory Visit: Payer: Self-pay

## 2022-03-06 ENCOUNTER — Other Ambulatory Visit (HOSPITAL_BASED_OUTPATIENT_CLINIC_OR_DEPARTMENT_OTHER): Payer: Self-pay

## 2022-03-06 VITALS — BP 120/84 | HR 79 | Ht 70.0 in | Wt 298.4 lb

## 2022-03-06 DIAGNOSIS — E1142 Type 2 diabetes mellitus with diabetic polyneuropathy: Secondary | ICD-10-CM

## 2022-03-06 DIAGNOSIS — E785 Hyperlipidemia, unspecified: Secondary | ICD-10-CM | POA: Diagnosis not present

## 2022-03-06 DIAGNOSIS — E1165 Type 2 diabetes mellitus with hyperglycemia: Secondary | ICD-10-CM

## 2022-03-06 LAB — POCT GLYCOSYLATED HEMOGLOBIN (HGB A1C): Hemoglobin A1C: 7.8 % — AB (ref 4.0–5.6)

## 2022-03-06 MED ORDER — METFORMIN HCL 1000 MG PO TABS
2000.0000 mg | ORAL_TABLET | Freq: Every day | ORAL | 3 refills | Status: DC
  Filled 2022-03-06 – 2022-04-03 (×2): qty 180, 90d supply, fill #0
  Filled 2022-07-03: qty 180, 90d supply, fill #1
  Filled 2022-09-25: qty 180, 90d supply, fill #2
  Filled 2022-12-24: qty 180, 90d supply, fill #3

## 2022-03-06 MED ORDER — ULTICARE MICRO PEN NEEDLES 32G X 4 MM MISC
3 refills | Status: DC
  Filled 2022-03-06: qty 100, 90d supply, fill #0
  Filled 2022-06-05: qty 100, 90d supply, fill #1
  Filled 2022-09-04: qty 100, 90d supply, fill #2
  Filled 2022-12-02: qty 100, 90d supply, fill #3

## 2022-03-06 NOTE — Progress Notes (Signed)
Patient ID: Victor Estrada, male   DOB: [birth year], 51 y.o.   MRN: [id number]   HPI: Victor Estrada is a 51 y.o.-year-old male, initially referred by his previous PCP, Dr. Moreen Fowler, returning for follow-up for DM2, dx in 2005, non-insulin-dependent, uncontrolled, with long-term complications (PN). His wife is also my pt: Victor Estrada. He saw Dr. Loanne Drilling in 2013.  Last visit with me 7 months ago.  Interim history: No increased urination, blurry vision, nausea, chest pain. Continues to have numbness in feet L>R. He started to change his diet last month.  He did relax during Thanksgiving, but overall, he feels that he is doing much better with his meals compared to last visit.  Reviewed latest HbA1c level: 08/03/2021: HbA1c 9.7% 01/16/2021: HbA1c 7.7% 06/30/2020: HbA1c 7.7% Lab Results  Component Value Date   HGBA1C 10.5 12/30/2019   HGBA1C 9.8 04/13/2019   HGBA1C 11.1 (H) 11/03/2015   HGBA1C 10.5 (H) 02/06/2015   HGBA1C 6.6 (H) 09/14/2013   HGBA1C 6.8 (H) 05/28/2013   HGBA1C 10.5 (H) 01/27/2012   HGBA1C 9.9 (H) 10/01/2011   HGBA1C 9.9 (H) 05/23/2011  05/19/2018: HbA1c 11% 04/01/2017: HbA1c 11.2%  Previously on: - Janumet 50-1000 mg 2x a day, with meals - Jardiance 10 mg before breakfast He tried Glimepiride in the past.  Now on: - Metformin 1000 mg 2x a day with meals >> 2000 mg daily at night (bedtime) - Jardiance 10 >> 25 mg before b'fast - Trulicity 3.41 >> 1.5 >> 3 mg -was taking it <50% of the time in 07/2021 >> now consistently - Tresiba 14 units (started 04/2020) >> 32 >> 40 >> 36 units daily  He checks sugars >4x a day:   Previously: - am: 100, 130-160 - 2h after b'fast: n/c - lunch: n/c - 2h after lunch:170-320 - dinner: n/c - 2h after dinner: 170-320 - bedtime: n/c  Previously:   Lowest sugar was 130  >> 95 >> 100 >> 100 >> 88, 111; he has hypoglycemia awareness in the 90s. Highest sugar was 350 >> 280 >> 300>> 270.  Glucometer: One Touch ultra >>  Freestyle  Pt's meals are: - Breakfast: skips >> old-fashioned oatmeal with fruit - Lunch: lean cuisine, fast food - Dinner: same - Snacks: protein bars, PB crackers, fruit Stopped regular sodas.  -+ Mild CKD, last BUN/creatinine:  01/30/2022: 32/1.18, GFR 75, glucose 125 08/03/2021: 27/1.11, GFR 81 01/16/2021: 27/21.04, GFR 88, glucose 124 06/30/2020: 27/1.0, GFR 92, glucose 121 Lab Results  Component Value Date   BUN 23 (H) 11/09/2018   BUN 19 04/23/2016   CREATININE 1.09 11/09/2018   CREATININE 1.29 (H) 04/23/2016  05/19/2018: 24/1.07, GFR 74, glucose 306, otherwise normal CMP On lisinopril 20.  -+ HL; last set of lipids: 01/30/2022: 160/440/33/60 08/03/2021: 171/349/36/78 01/16/2021: 132/228/35/60 06/30/2020: 142/249/30/71 05/19/2018: 368/1181/33/not calculated Lab Results  Component Value Date   CHOL 151 11/03/2015   HDL 37 (L) 11/03/2015   LDLCALC 48 11/03/2015   LDLDIRECT 92.0 02/06/2015   TRIG 330 (H) 11/03/2015   CHOLHDL 4.1 11/03/2015  On Zocor 40, Vascepa added 04/2018.  - last eye exam was in 2021: No DR reportedly. [facility].  -+ Numbness and tingling in his feet.  On Neurontin 600 >> 900 mg 2x >> 3x a day.  He had foot exam in 2022 by Dr. Littie Deeds.    Pt has FH of DM in father.  She also has a history of HTN, GERD, OSA, obesity class III, kidney stones.  He has a family  history of bile duct cancer in mother.  ROS: + see HPI + acid reflux  I reviewed pt's medications, allergies, PMH, social hx, family hx, and changes were documented in the history of present illness. Otherwise, unchanged from my initial visit note.  Past Medical History:  Diagnosis Date   Anxiety    Complication of anesthesia    Constipation    Depression    Diabetes mellitus    type 2   GERD (gastroesophageal reflux disease)    Head injury, acute, without loss of consciousness    History of kidney stones    Hyperlipidemia    Hypertension    Kidney stone    MDD (major  depressive disorder)    Peripheral neuropathy    PONV (postoperative nausea and vomiting)    post wisdom teeth   Shortness of breath    with exertion   Sinus disorder    Sleep apnea    Past Surgical History:  Procedure Laterality Date   DENTAL SURGERY     INCISION AND DRAINAGE ABSCESS Left 11/12/2013   Procedure: INCISION AND DRAINAGE LEFT CHEEK  ABSCESS;  Surgeon: Melida Quitter, MD;  Location: [facility];  Service: ENT;  Laterality: Left;   NASAL SEPTOPLASTY W/ TURBINOPLASTY N/A 04/26/2016   Procedure: NASAL SEPTOPLASTY WITH TURBINATE REDUCTION;  Surgeon: Melida Quitter, MD;  Location: [facility] OR;  Service: ENT;  Laterality: N/A;   WISDOM TOOTH EXTRACTION     Social History   Socioeconomic History   Marital status: Married  Occupational History  Social Designer, fashion/clothing strain: Not on file   Food insecurity:    Worry: Not on file    Inability: Not on file   Transportation needs:    Medical: Not on file    Non-medical: Not on file  Tobacco Use   Smoking status: Never Smoker   Smokeless tobacco: Never Used  Substance and Sexual Activity   Alcohol use: No   Drug use: No   Sexual activity: Not on file  Lifestyle   Physical activity:    Days per week: Not on file    Minutes per session: Not on file   Stress: Not on file  Relationships   Social connections:    Talks on phone: Not on file    Gets together: Not on file    Attends religious service: Not on file    Active member of club or organization: Not on file    Attends meetings of clubs or organizations: Not on file    Relationship status: Not on file   Intimate partner violence:    Fear of current or ex partner: Not on file    Emotionally abused: Not on file    Physically abused: Not on file    Forced sexual activity: Not on file  Other Topics Concern   Not on file  Social History Narrative   Married 1 son   Dance movement psychotherapist   2-3 caffeine drinks/day   Current Outpatient Medications on File Prior to Visit   Medication Sig Dispense Refill   acetaminophen (TYLENOL) 500 MG tablet Take 1,000 mg by mouth every 6 (six) hours as needed (for pain).      Continuous Blood Gluc Sensor (DEXCOM G7 SENSOR) MISC Apply 1 sensor every 10 days as directed 9 each 4   Dulaglutide (TRULICITY) 3 HK/7.4QV SOPN Inject 3 mg into the skin once a week. 6 mL 3   empagliflozin (JARDIANCE) 25 MG TABS tablet Take 1 tablet (25  mg total) by mouth daily before breakfast. 90 tablet 3   fexofenadine (ALLEGRA) 180 MG tablet Take by mouth.     gabapentin (NEURONTIN) 600 MG tablet Take 1.5 tablets (900 mg total) by mouth every 8 (eight) hours. 135 tablet 2   gabapentin (NEURONTIN) 600 MG tablet Take 1 & 1/2 tablets by mouth every 8 hours 405 tablet 1   gabapentin (NEURONTIN) 600 MG tablet Take 1.5 tablets (900 mg total) by mouth every 8 (eight) hours. 135 tablet 2   ibuprofen (ADVIL) 200 MG tablet Take 200 mg by mouth every 6 (six) hours as needed.     icosapent Ethyl (VASCEPA) 1 g capsule Take 2 capsules (2 g total) by mouth 2 (two) times daily with a meal. 120 capsule 6   insulin degludec (TRESIBA FLEXTOUCH) 200 UNIT/ML FlexTouch Pen Inject up to 36 Units into the skin daily. 9 mL 1   Insulin Pen Needle (ULTICARE MICRO PEN NEEDLES) 32G X 4 MM MISC USE ONCE A DAY AS DIRECTED 100 each 3   lisinopril (ZESTRIL) 20 MG tablet Take 1 tablet (20 mg total) by mouth daily. 30 tablet 2   metFORMIN (GLUCOPHAGE) 1000 MG tablet Take 1 tablet (1,000 mg total) by mouth 2 (two) times daily with a meal. 180 tablet 1   Multiple Vitamin (MULTIVITAMIN) tablet Take 1 tablet by mouth daily.     pantoprazole (PROTONIX) 40 MG tablet Take 1 tablet (40 mg total) by mouth daily. 90 tablet 1   simvastatin (ZOCOR) 40 MG tablet TAKE 1 TABLET BY MOUTH ONCE DAILY 90 tablet 1   simvastatin (ZOCOR) 40 MG tablet Take 1 tablet (40 mg total) by mouth every evening. 90 tablet 1   No current facility-administered medications on file prior to visit.   No Known  Allergies Family History  Problem Relation Age of Onset   Cancer Mother    Hyperlipidemia Father    Heart disease Father    Diabetes Father    Heart disease Maternal Grandmother    Diabetes Paternal Grandmother    Stomach cancer Maternal Aunt    Colon cancer Neg Hx    Esophageal cancer Neg Hx    Colon polyps Neg Hx    PE: BP 120/84 (BP Location: Right Arm, Patient Position: Sitting, Cuff Size: Normal)   Pulse 79   Ht '5\' 10"'$  (1.778 m)   Wt 298 lb 6.4 oz (135.4 kg)   SpO2 99%   BMI 42.82 kg/m  Wt Readings from Last 3 Encounters:  03/06/22 298 lb 6.4 oz (135.4 kg)  08/14/21 299 lb (135.6 kg)  08/07/21 298 lb 12.8 oz (135.5 kg)   Constitutional: overweight, in NAD Eyes:  EOMI, no exophthalmos ENT: no neck masses, no cervical lymphadenopathy Cardiovascular: RRR, No MRG Respiratory: CTA B Musculoskeletal: no deformities Skin:no rashes Neurological: no tremor with outstretched hands Diabetic Foot Exam - Simple   Simple Foot Form Diabetic Foot exam was performed with the following findings: Yes 03/06/2022 10:55 AM  Visual Inspection No deformities, no ulcerations, no other skin breakdown bilaterally: Yes Sensation Testing See comments: Yes Pulse Check Posterior Tibialis and Dorsalis pulse intact bilaterally: Yes Comments Slight decrease in sensation in L foot dorsum    ASSESSMENT: 1. DM2, non-insulin-dependent, uncontrolled, with long-term complications -PN -Mild CKD  2. HL  3. Obesity class III  PLAN:  1. Patient with longstanding, uncontrolled, type 2 diabetes, on oral antidiabetic regimen with metformin, SGLT2 inhibitor, weekly GLP-1 receptor agonist and also daily long-acting insulin.  At last  visit, HbA1c was 9.7%, increased from 7.7%.  At that time, he was not consistently exercising, was taking Trulicity less than 17% of the time and he was off his CGM.  I sent a prescription for the Dexcom G7 to his pharmacy and advised him to start this.  We also increased  the dose of [place].  We discussed about the need to improve his diet as he admitted to many dietary indiscretions.  I also recommended consistent exercise. CGM interpretation: -At today's visit, we reviewed his CGM downloads: It appears that 64% of values are in target range (goal >70%), while 36% are higher than 180 (goal <25%), and 0% are lower than 70 (goal <4%).  The calculated average blood sugar is 171.  The projected HbA1c for the next 3 months (GMI) is 7.4%. -Reviewing the CGM trends, sugars appear to be fluctuating around the upper limit of the target range.  He has hyperglycemic spikes after lunch and dinner, now during the holidays.  However, he tells me that he is doing much better at remembering to take his medications consistently, every day.  Due to the improvement in blood sugars, I did not recommend a change in his regimen.  At next visit, after the holidays, if the sugars remain elevated after meals, we can consider increasing his Trulicity dose. -He noticed that his sugars improved after moving metformin from dinnertime to bedtime, which is after midnight.  Will continue this for now. - I suggested to:  Patient Instructions  Please continue: - Metformin 2000 mg at bedtime - Jardiance 25 mg before b'fast - Trulicity 3 mg weekly - Tresiba 40 units daily  Please return in 4 months.  - we checked his HbA1c: 7.8% (lower) - advised to check sugars at different times of the day - 4x a day, rotating check times - advised for yearly eye exams >> he is UTD - return to clinic in 4 months  2. HL - Reviewed latest lipid panel from 1 months ago: Triglycerides high, LDL at goal -He mentions that he just started to improve his diet when the above results were checked  -He continues on Zocor 40 mg daily and Vascepa 2 g twice a day without side effects  3. Obesity class III -continue SGLT 2 inhibitor and GLP-1 receptor agonist which should also help with weight loss -He  gained 3 pounds before last visit, but weight approximately stable since then.  Philemon Kingdom, MD PhD [facility] Endocrinology

## 2022-03-06 NOTE — Patient Instructions (Addendum)
Please continue: - Metformin 2000 mg with dinner - Jardiance 25 mg before b'fast - Trulicity 3 mg weekly - Tresiba 36 units daily  Please return in 4 months.

## 2022-03-07 ENCOUNTER — Other Ambulatory Visit: Payer: Self-pay

## 2022-03-08 ENCOUNTER — Other Ambulatory Visit (HOSPITAL_BASED_OUTPATIENT_CLINIC_OR_DEPARTMENT_OTHER): Payer: Self-pay

## 2022-03-08 MED ORDER — GABAPENTIN 600 MG PO TABS
900.0000 mg | ORAL_TABLET | Freq: Three times a day (TID) | ORAL | 0 refills | Status: DC
  Filled 2022-03-08: qty 405, 90d supply, fill #0
  Filled 2022-06-05: qty 405, 90d supply, fill #1
  Filled 2022-06-07: qty 270, 60d supply, fill #1

## 2022-04-01 ENCOUNTER — Other Ambulatory Visit: Payer: Self-pay

## 2022-04-01 ENCOUNTER — Other Ambulatory Visit (HOSPITAL_BASED_OUTPATIENT_CLINIC_OR_DEPARTMENT_OTHER): Payer: Self-pay

## 2022-04-03 ENCOUNTER — Other Ambulatory Visit (HOSPITAL_BASED_OUTPATIENT_CLINIC_OR_DEPARTMENT_OTHER): Payer: Self-pay

## 2022-04-29 ENCOUNTER — Other Ambulatory Visit (HOSPITAL_BASED_OUTPATIENT_CLINIC_OR_DEPARTMENT_OTHER): Payer: Self-pay

## 2022-04-29 ENCOUNTER — Other Ambulatory Visit: Payer: Self-pay | Admitting: Internal Medicine

## 2022-04-29 ENCOUNTER — Other Ambulatory Visit: Payer: Self-pay

## 2022-04-29 DIAGNOSIS — E1142 Type 2 diabetes mellitus with diabetic polyneuropathy: Secondary | ICD-10-CM

## 2022-04-29 MED ORDER — TRESIBA FLEXTOUCH 200 UNIT/ML ~~LOC~~ SOPN
36.0000 [IU] | PEN_INJECTOR | Freq: Every day | SUBCUTANEOUS | 1 refills | Status: DC
  Filled 2022-04-29: qty 9, 50d supply, fill #0
  Filled 2022-06-05 – 2022-06-10 (×2): qty 9, 50d supply, fill #1

## 2022-05-27 ENCOUNTER — Other Ambulatory Visit (HOSPITAL_BASED_OUTPATIENT_CLINIC_OR_DEPARTMENT_OTHER): Payer: Self-pay

## 2022-05-28 ENCOUNTER — Other Ambulatory Visit (HOSPITAL_BASED_OUTPATIENT_CLINIC_OR_DEPARTMENT_OTHER): Payer: Self-pay

## 2022-06-01 ENCOUNTER — Ambulatory Visit
Admission: RE | Admit: 2022-06-01 | Discharge: 2022-06-01 | Disposition: A | Discharge status: Home / Self Care | Payer: Commercial Managed Care - PPO | Source: Ambulatory Visit | Attending: Urgent Care | Admitting: Urgent Care

## 2022-06-01 ENCOUNTER — Other Ambulatory Visit (HOSPITAL_BASED_OUTPATIENT_CLINIC_OR_DEPARTMENT_OTHER): Payer: Self-pay

## 2022-06-01 VITALS — BP 114/76 | HR 90 | Temp 97.6°F | Resp 20 | Ht 70.0 in | Wt 300.0 lb

## 2022-06-01 DIAGNOSIS — L03116 Cellulitis of left lower limb: Secondary | ICD-10-CM

## 2022-06-01 DIAGNOSIS — B353 Tinea pedis: Secondary | ICD-10-CM

## 2022-06-01 MED ORDER — CEFTRIAXONE SODIUM 1 G IJ SOLR
1000.0000 mg | Freq: Once | INTRAMUSCULAR | Status: AC
  Administered 2022-06-01: 1000 mg via INTRAMUSCULAR

## 2022-06-01 MED ORDER — CEPHALEXIN 500 MG PO CAPS
500.0000 mg | ORAL_CAPSULE | Freq: Four times a day (QID) | ORAL | 0 refills | Status: AC
  Filled 2022-06-01: qty 28, 7d supply, fill #0

## 2022-06-01 NOTE — ED Provider Notes (Signed)
[name]    CSN: [id number] Arrival date & time: 06/01/22  1400      History   Chief Complaint Chief Complaint  Patient presents with   Leg Pain    HPI Victor Estrada is a 52 y.o. male.   52yo male presents today with concerns of LLE cellulitis. States he has had it numerous times in the past and sx feel similar. Reports having had an ingrown toenail on his L big toe a few days ago, and "dug it out" at home. Then, was in the shower with the sewage water backing up onto his feet. He believes this may be the cause. Denies any calf pain. No smoking. DM controlled. No hx of blood clots, states he had a [place] two years ago when he had cellulitis in the past which was negative. No leg trauma. Pt denies any fevers, lethargy, fatigue or GI sx.    Leg Pain   Past Medical History:  Diagnosis Date   Anxiety    Complication of anesthesia    Constipation    Depression    Diabetes mellitus    type 2   GERD (gastroesophageal reflux disease)    Head injury, acute, without loss of consciousness    History of kidney stones    Hyperlipidemia    Hypertension    Kidney stone    MDD (major depressive disorder)    Peripheral neuropathy    PONV (postoperative nausea and vomiting)    post wisdom teeth   Shortness of breath    with exertion   Sinus disorder    Sleep apnea     Patient Active Problem List   Diagnosis Date Noted   Class 3 obesity ([place]) 01/28/2020   Poorly controlled type 2 diabetes mellitus with peripheral neuropathy ([place]) 06/26/2018   Hypersomnia 01/19/2016   OSA (obstructive sleep apnea) 11/03/2015   Abdominal wall strain 07/28/2014   URI (upper respiratory infection) 04/20/2014   Cellulitis and abscess of face 11/11/2013   GERD (gastroesophageal reflux disease) 05/28/2013   Allergic rhinitis 05/28/2013   Routine general medical examination at a health care facility 05/28/2013   Adjustment disorder with mixed anxiety and depressed mood 01/27/2012    Sinusitis 12/20/2011   HTN (hypertension) 05/23/2011   Hyperlipidemia 05/23/2011   Morbid obesity ([place]) 05/23/2011    Past Surgical History:  Procedure Laterality Date   DENTAL SURGERY     INCISION AND DRAINAGE ABSCESS Left 11/12/2013   Procedure: INCISION AND DRAINAGE LEFT CHEEK  ABSCESS;  Surgeon: Melida Quitter, MD;  Location: [facility];  Service: ENT;  Laterality: Left;   NASAL SEPTOPLASTY W/ TURBINOPLASTY N/A 04/26/2016   Procedure: NASAL SEPTOPLASTY WITH TURBINATE REDUCTION;  Surgeon: Melida Quitter, MD;  Location: [facility];  Service: ENT;  Laterality: N/A;   WISDOM TOOTH EXTRACTION         Home Medications    Prior to Admission medications   Medication Sig Start Date End Date Taking? Authorizing Provider  cephALEXin (KEFLEX) 500 MG capsule Take 1 capsule (500 mg total) by mouth 4 (four) times daily for 7 days. 06/01/22 06/08/22 Yes Gabriele Zwilling L, PA  acetaminophen (TYLENOL) 500 MG tablet Take 1,000 mg by mouth every 6 (six) hours as needed (for pain).     [provider]  Continuous Blood Gluc Sensor (DEXCOM G7 SENSOR) MISC Apply 1 sensor every 10 days as directed 08/07/21   Philemon Kingdom, MD  Dulaglutide (TRULICITY) 3 0000000 SOPN Inject 3 mg into the skin  once a week. 11/19/21   Philemon Kingdom, MD  empagliflozin (JARDIANCE) 25 MG TABS tablet Take 1 tablet (25 mg total) by mouth daily before breakfast. 08/07/21   Philemon Kingdom, MD  fexofenadine (ALLEGRA) 180 MG tablet Take by mouth.    [provider]  gabapentin (NEURONTIN) 600 MG tablet Take 1.5 tablets (900 mg total) by mouth every 8 (eight) hours. 12/28/20   Penumalli, Earlean Polka, MD  gabapentin (NEURONTIN) 600 MG tablet Take 1 & 1/2 tablets by mouth every 8 hours 08/03/21     gabapentin (NEURONTIN) 600 MG tablet Take 1.5 tablets (900 mg total) by mouth every 8 (eight) hours. 01/31/22   Antony Contras, MD  ibuprofen (ADVIL) 200 MG tablet Take 200 mg by mouth every 6 (six) hours as needed.    [provider]   icosapent Ethyl (VASCEPA) 1 g capsule Take 2 capsules (2 g total) by mouth 2 (two) times daily with a meal. 02/07/22     insulin degludec (TRESIBA FLEXTOUCH) 200 UNIT/ML FlexTouch Pen Inject up to 36 Units into the skin daily. 04/29/22   Philemon Kingdom, MD  Insulin Pen Needle (ULTICARE MICRO PEN NEEDLES) 32G X 4 MM MISC USE ONCE A DAY AS DIRECTED 03/06/22 03/06/23  Philemon Kingdom, MD  lisinopril (ZESTRIL) 20 MG tablet Take 1 tablet (20 mg total) by mouth daily. 01/31/22 06/30/22    metFORMIN (GLUCOPHAGE) 1000 MG tablet Take 2 tablets (2,000 mg total) by mouth at bedtime. 03/06/22 09/02/22  Philemon Kingdom, MD  Multiple Vitamin (MULTIVITAMIN) tablet Take 1 tablet by mouth daily.    [provider]  pantoprazole (PROTONIX) 40 MG tablet Take 1 tablet (40 mg total) by mouth daily. 01/31/22     simvastatin (ZOCOR) 40 MG tablet TAKE 1 TABLET BY MOUTH ONCE DAILY 12/30/19 12/29/20  Antony Contras, MD  simvastatin (ZOCOR) 40 MG tablet Take 1 tablet (40 mg total) by mouth every evening. 01/31/22       Family History Family History  Problem Relation Age of Onset   Cancer Mother    Hyperlipidemia Father    Heart disease Father    Diabetes Father    Heart disease Maternal Grandmother    Diabetes Paternal Grandmother    Stomach cancer Maternal Aunt    Colon cancer Neg Hx    Esophageal cancer Neg Hx    Colon polyps Neg Hx     Social History Social History   Tobacco Use   Smoking status: Never   Smokeless tobacco: Never  Vaping Use   Vaping Use: Never used  Substance Use Topics   Alcohol use: No   Drug use: No     Allergies   Patient has no known allergies.   Review of Systems Review of Systems As per HPI  Physical Exam Triage Vital Signs ED Triage Vitals  Enc Vitals Group     BP 06/01/22 1415 114/76     Pulse Rate 06/01/22 1415 90     Resp 06/01/22 1415 20     Temp 06/01/22 1415 97.6 F (36.4 C)     Temp Source 06/01/22 1415 Oral     SpO2 06/01/22 1415 96 %      Weight 06/01/22 1411 300 lb (136.1 kg)     Height 06/01/22 1411 '5\' 10"'$  (1.778 m)     Head Circumference --      Peak Flow --      Pain Score 06/01/22 1410 5     Pain Loc --  Pain Edu? --      Excl. in [place]? --    No data found.  Updated Vital Signs BP 114/76 (BP Location: Right Arm)   Pulse 90   Temp 97.6 F (36.4 C) (Oral)   Resp 20   Ht '5\' 10"'$  (1.778 m)   Wt 300 lb (136.1 kg)   SpO2 96%   BMI 43.05 kg/m   Visual Acuity Right Eye Distance:   Left Eye Distance:   Bilateral Distance:    Right Eye Near:   Left Eye Near:    Bilateral Near:     Physical Exam Vitals and nursing note reviewed.  Constitutional:      General: He is not in acute distress.    Appearance: Normal appearance. He is obese. He is not ill-appearing, toxic-appearing or diaphoretic.  HENT:     Head: Normocephalic.  Cardiovascular:     Rate and Rhythm: Normal rate.     Pulses: Normal pulses.  Pulmonary:     Effort: Pulmonary effort is normal. No respiratory distress.  Musculoskeletal:        General: Swelling and tenderness present. Normal range of motion.     Right lower leg: Edema present.     Left lower leg: Edema present.     Comments: B lower extremity edema Normal pulses Negative homan sign  Skin:    General: Skin is warm.     Capillary Refill: Capillary refill takes less than 2 seconds.     Findings: Erythema present.     Comments: L great toe with recent manipulation to lateral and medial nail bed, dried blood noted bilaterally Mild tinea noted with small fissure between 4th and 5th digit L foot  Moderate erythema and warmth to anterior midline shin on L; not raised  Neurological:     General: No focal deficit present.     Mental Status: He is alert and oriented to person, place, and time.     Sensory: Sensory deficit (B neuropathy) present.      UC Treatments / Results  Labs (all labs ordered are listed, but only abnormal results are displayed) Labs Reviewed - No data to  display  EKG   Radiology No results found.  Procedures Procedures (including critical care time)  Medications Ordered in UC Medications  cefTRIAXone (ROCEPHIN) injection 1,000 mg (1,000 mg Intramuscular Given 06/01/22 1445)    Initial Impression / Assessment and Plan / UC Course  I have reviewed the triage vital signs and the nursing notes.  Pertinent labs & imaging results that were available during my care of the patient were reviewed by me and considered in my medical decision making (see chart for details).     Cellulitis LLE -patient with history of recurrent cellulitis.  Suspect that this is stemming from his poor big toenail hygiene.  Patient has had workups for DVTs in the past, previously negative.  Patient admits symptoms consistent with prior history of cellulitis.  IM ceftriaxone given in clinic, will discharge home with cephalexin orally.  ER precautions discussed. Tinea pedis - pt to purchase OTC lotrimin spray and spray between toes per directions.   Final Clinical Impressions(s) / UC Diagnoses   Final diagnoses:  Cellulitis of left lower extremity  Tinea pedis of left foot     Discharge Instructions      You were given an injection of rocephin, an antibiotic. Please start taking the cephalexin four times daily until gone. Monitor for any worsening swelling or pain. If you  develop tightness in the calf, please return for recheck or head to the ER for an ultrasound. Please read the attached handout with additional recommendations.     ED Prescriptions     Medication Sig Dispense Auth. Provider   cephALEXin (KEFLEX) 500 MG capsule Take 1 capsule (500 mg total) by mouth 4 (four) times daily for 7 days. 28 capsule Kenyette Gundy L, PA      PDMP not reviewed this encounter.   Chaney Malling, [place] 06/01/22 1450

## 2022-06-01 NOTE — Discharge Instructions (Signed)
You were given an injection of rocephin, an antibiotic. Please start taking the cephalexin four times daily until gone. Monitor for any worsening swelling or pain. If you develop tightness in the calf, please return for recheck or head to the ER for an ultrasound. Please read the attached handout with additional recommendations.

## 2022-06-01 NOTE — ED Triage Notes (Signed)
Pt presents to Urgent Care with c/o redness and pain to L lower leg since yesterday. Reports hx of cellulitis.

## 2022-06-05 ENCOUNTER — Other Ambulatory Visit (HOSPITAL_BASED_OUTPATIENT_CLINIC_OR_DEPARTMENT_OTHER): Payer: Self-pay

## 2022-06-07 ENCOUNTER — Other Ambulatory Visit (HOSPITAL_BASED_OUTPATIENT_CLINIC_OR_DEPARTMENT_OTHER): Payer: Self-pay

## 2022-06-10 ENCOUNTER — Other Ambulatory Visit (HOSPITAL_BASED_OUTPATIENT_CLINIC_OR_DEPARTMENT_OTHER): Payer: Self-pay

## 2022-07-03 ENCOUNTER — Other Ambulatory Visit (HOSPITAL_BASED_OUTPATIENT_CLINIC_OR_DEPARTMENT_OTHER): Payer: Self-pay

## 2022-07-03 ENCOUNTER — Other Ambulatory Visit: Payer: Self-pay

## 2022-07-04 ENCOUNTER — Other Ambulatory Visit (HOSPITAL_BASED_OUTPATIENT_CLINIC_OR_DEPARTMENT_OTHER): Payer: Self-pay

## 2022-07-04 MED ORDER — LISINOPRIL 20 MG PO TABS
20.0000 mg | ORAL_TABLET | Freq: Every day | ORAL | 0 refills | Status: DC
  Filled 2022-07-04: qty 90, 90d supply, fill #0

## 2022-07-08 ENCOUNTER — Ambulatory Visit: Payer: Self-pay | Admitting: Internal Medicine

## 2022-08-01 ENCOUNTER — Other Ambulatory Visit (HOSPITAL_BASED_OUTPATIENT_CLINIC_OR_DEPARTMENT_OTHER): Payer: Self-pay

## 2022-08-01 DIAGNOSIS — I10 Essential (primary) hypertension: Secondary | ICD-10-CM | POA: Diagnosis not present

## 2022-08-01 DIAGNOSIS — F320 Major depressive disorder, single episode, mild: Secondary | ICD-10-CM | POA: Diagnosis not present

## 2022-08-01 DIAGNOSIS — G4733 Obstructive sleep apnea (adult) (pediatric): Secondary | ICD-10-CM | POA: Diagnosis not present

## 2022-08-01 DIAGNOSIS — G4709 Other insomnia: Secondary | ICD-10-CM | POA: Diagnosis not present

## 2022-08-01 DIAGNOSIS — E1169 Type 2 diabetes mellitus with other specified complication: Secondary | ICD-10-CM | POA: Diagnosis not present

## 2022-08-01 DIAGNOSIS — K219 Gastro-esophageal reflux disease without esophagitis: Secondary | ICD-10-CM | POA: Diagnosis not present

## 2022-08-01 DIAGNOSIS — G629 Polyneuropathy, unspecified: Secondary | ICD-10-CM | POA: Diagnosis not present

## 2022-08-01 DIAGNOSIS — E7800 Pure hypercholesterolemia, unspecified: Secondary | ICD-10-CM | POA: Diagnosis not present

## 2022-08-01 DIAGNOSIS — B356 Tinea cruris: Secondary | ICD-10-CM | POA: Diagnosis not present

## 2022-08-01 MED ORDER — GABAPENTIN 600 MG PO TABS
900.0000 mg | ORAL_TABLET | Freq: Three times a day (TID) | ORAL | 1 refills | Status: AC
  Filled 2022-08-01: qty 405, 90d supply, fill #0
  Filled 2022-10-30: qty 405, 90d supply, fill #1

## 2022-08-01 MED ORDER — PANTOPRAZOLE SODIUM 40 MG PO TBEC
40.0000 mg | DELAYED_RELEASE_TABLET | Freq: Every day | ORAL | 1 refills | Status: DC
  Filled 2022-08-01 – 2022-08-28 (×2): qty 90, 90d supply, fill #0
  Filled 2022-11-20: qty 90, 90d supply, fill #1

## 2022-08-01 MED ORDER — LISINOPRIL 20 MG PO TABS
20.0000 mg | ORAL_TABLET | Freq: Every day | ORAL | 1 refills | Status: DC
  Filled 2022-08-01 – 2022-09-25 (×2): qty 90, 90d supply, fill #0
  Filled 2022-12-24: qty 90, 90d supply, fill #1

## 2022-08-01 MED ORDER — SIMVASTATIN 40 MG PO TABS
40.0000 mg | ORAL_TABLET | Freq: Every evening | ORAL | 1 refills | Status: DC
  Filled 2022-08-01 – 2022-09-25 (×2): qty 90, 90d supply, fill #0
  Filled 2022-12-24: qty 90, 90d supply, fill #1

## 2022-08-01 MED ORDER — CLOTRIMAZOLE 1 % EX CREA
1.0000 | TOPICAL_CREAM | Freq: Two times a day (BID) | CUTANEOUS | 0 refills | Status: DC
  Filled 2022-08-01: qty 30, 15d supply, fill #0

## 2022-08-02 ENCOUNTER — Other Ambulatory Visit (HOSPITAL_BASED_OUTPATIENT_CLINIC_OR_DEPARTMENT_OTHER): Payer: Self-pay

## 2022-08-02 ENCOUNTER — Other Ambulatory Visit: Payer: Self-pay | Admitting: Internal Medicine

## 2022-08-02 DIAGNOSIS — E1165 Type 2 diabetes mellitus with hyperglycemia: Secondary | ICD-10-CM

## 2022-08-05 ENCOUNTER — Encounter: Payer: Self-pay | Admitting: Internal Medicine

## 2022-08-05 ENCOUNTER — Other Ambulatory Visit (HOSPITAL_BASED_OUTPATIENT_CLINIC_OR_DEPARTMENT_OTHER): Payer: Self-pay

## 2022-08-05 ENCOUNTER — Other Ambulatory Visit: Payer: Self-pay

## 2022-08-05 ENCOUNTER — Ambulatory Visit: Payer: Commercial Managed Care - PPO | Admitting: Internal Medicine

## 2022-08-05 VITALS — BP 120/76 | HR 91 | Ht 70.0 in | Wt 301.4 lb

## 2022-08-05 DIAGNOSIS — Z7984 Long term (current) use of oral hypoglycemic drugs: Secondary | ICD-10-CM | POA: Diagnosis not present

## 2022-08-05 DIAGNOSIS — Z7985 Long-term (current) use of injectable non-insulin antidiabetic drugs: Secondary | ICD-10-CM

## 2022-08-05 DIAGNOSIS — E1142 Type 2 diabetes mellitus with diabetic polyneuropathy: Secondary | ICD-10-CM | POA: Diagnosis not present

## 2022-08-05 DIAGNOSIS — Z794 Long term (current) use of insulin: Secondary | ICD-10-CM

## 2022-08-05 DIAGNOSIS — E785 Hyperlipidemia, unspecified: Secondary | ICD-10-CM | POA: Diagnosis not present

## 2022-08-05 DIAGNOSIS — E119 Type 2 diabetes mellitus without complications: Secondary | ICD-10-CM | POA: Diagnosis not present

## 2022-08-05 DIAGNOSIS — E1165 Type 2 diabetes mellitus with hyperglycemia: Secondary | ICD-10-CM | POA: Diagnosis not present

## 2022-08-05 MED ORDER — TRESIBA FLEXTOUCH 200 UNIT/ML ~~LOC~~ SOPN
36.0000 [IU] | PEN_INJECTOR | Freq: Every day | SUBCUTANEOUS | 3 refills | Status: DC
  Filled 2022-08-05: qty 9, 50d supply, fill #0
  Filled 2022-08-28 – 2022-09-19 (×2): qty 9, 50d supply, fill #1
  Filled 2022-11-07: qty 9, 50d supply, fill #2
  Filled 2022-12-27: qty 9, 50d supply, fill #3

## 2022-08-05 MED ORDER — TRULICITY 3 MG/0.5ML ~~LOC~~ SOAJ
3.0000 mg | SUBCUTANEOUS | 3 refills | Status: DC
  Filled 2022-08-05: qty 2, 28d supply, fill #0
  Filled 2022-09-04: qty 2, 28d supply, fill #1
  Filled 2022-10-01: qty 2, 28d supply, fill #2

## 2022-08-05 MED ORDER — EMPAGLIFLOZIN 25 MG PO TABS
25.0000 mg | ORAL_TABLET | Freq: Every day | ORAL | 3 refills | Status: DC
  Filled 2022-08-05 – 2022-08-28 (×2): qty 90, 90d supply, fill #0
  Filled 2022-11-20: qty 90, 90d supply, fill #1
  Filled 2023-02-18: qty 90, 90d supply, fill #2
  Filled 2023-05-19: qty 90, 90d supply, fill #3

## 2022-08-05 NOTE — Patient Instructions (Addendum)
Please continue: - Metformin 2000 mg at bedtime - Jardiance 25 mg before b'fast - Tresiba 36 units daily  Restart:  - Trulicity 1.5 mg weekly, then increase 3 mg weekly  Please return in 4 months.

## 2022-08-05 NOTE — Progress Notes (Signed)
Patient ID: Victor Estrada, male   DOB: January 13, 1971, 52 y.o.   MRN: [id number]   HPI: Victor Estrada is a 52 y.o.-year-old male, initially referred by his previous PCP, Dr. Azucena Cecil, returning for follow-up for DM2, dx in 2005, non-insulin-dependent, uncontrolled, with long-term complications (PN). His wife is also my pt: Tammy Prestwood. He saw Dr. Everardo All in 2013.  Last visit with me 5 months ago.  Interim history: No increased urination, blurry vision, nausea, chest pain. She was in the emergency room for cellulitis in L leg 06/01/2022. He prev. Had 3 episodes in R leg before. He stopped taking Trulicity since last OV. He tried to take it 2 weeks ago (3 mg) >> AP so he stopped again. He also stopped using the Dexcom after he had 2 sensors that failed since last visit.  He does have it at home.  Reviewed latest HbA1c level: 07/30/2021: HbA1c 8.7% Lab Results  Component Value Date   HGBA1C 7.8 (A) 03/06/2022   HGBA1C 10.5 12/30/2019   HGBA1C 9.8 04/13/2019   HGBA1C 11.1 (H) 11/03/2015   HGBA1C 10.5 (H) 02/06/2015   HGBA1C 6.6 (H) 09/14/2013   HGBA1C 6.8 (H) 05/28/2013   HGBA1C 10.5 (H) 01/27/2012   HGBA1C 9.9 (H) 10/01/2011   HGBA1C 9.9 (H) 05/23/2011  08/03/2021: HbA1c 9.7% 01/16/2021: HbA1c 7.7% 06/30/2020: HbA1c 7.7% 05/19/2018: HbA1c 11% 04/01/2017: HbA1c 11.2%  Previously on: - Janumet 50-1000 mg 2x a day, with meals - Jardiance 10 mg before breakfast He tried Glimepiride in the past.  Now on: - Metformin 1000 mg 2x a day with meals >> 2000 mg daily at night (bedtime) >> 1000 mg with supper, 1000 mg at bedtime - Jardiance 10 >> 25 mg before b'fast - Trulicity 0.75 >> 1.5 >> 3 mg -was taking it <50% of the time in 07/2021 >> off - Tresiba 14 units (started 04/2020) >> 32 >> 40 >> 36 units daily  He checks sugars inconsistently a day: - am: 95-130 - 2h after b'fast: <200 - lunch: n/c - 2h after lunch: <300 - dinner: n/c - 2h after dinner: n/c - bedtime:  n/c  Previously:   Lowest sugar was 100 >> 88, 111 >> 95 ; he has hypoglycemia awareness in the 90s. Highest sugar was 350 >> 280 >> 300>> 270 >> <300.  Glucometer: One Touch ultra >> Freestyle  Pt's meals are: - Breakfast: skips >> old-fashioned oatmeal with fruit - Lunch: lean cuisine, fast food - Dinner: same - Snacks: protein bars, PB crackers, fruit Stopped regular sodas.  -+ Mild CKD, last BUN/creatinine:  07/30/2021: 31/1.18, GFR 75, glucose 203 01/30/2022: 32/1.18, GFR 75, glucose 125 08/03/2021: 27/1.11, GFR 81 01/16/2021: 27/21.04, GFR 88, glucose 124 06/30/2020: 27/1.0, GFR 92, glucose 121 Lab Results  Component Value Date   BUN 23 (H) 11/09/2018   BUN 19 04/23/2016   CREATININE 1.09 11/09/2018   CREATININE 1.29 (H) 04/23/2016  05/19/2018: 24/1.07, GFR 74, glucose 306, otherwise normal CMP On lisinopril 20.  -+ HL; last set of lipids: 08/01/2022: 126/283/38/45 01/30/2022: 160/440/33/60 08/03/2021: 171/349/36/78 01/16/2021: 132/228/35/60 06/30/2020: 142/249/30/71 05/19/2018: 368/1181/33/not calculated Lab Results  Component Value Date   CHOL 151 11/03/2015   HDL 37 (L) 11/03/2015   LDLCALC 48 11/03/2015   LDLDIRECT 92.0 02/06/2015   TRIG 330 (H) 11/03/2015   CHOLHDL 4.1 11/03/2015  On Zocor 40, Vascepa added 04/2018.  - last eye exam was in 2021: No DR reportedly. [facility].  -+ Numbness and tingling in his feet.  On Neurontin  600 >> 900 mg 2x >> 3x a day.  Last foot exam 03/06/2022.  Pt has FH of DM in father.  She also has a history of HTN, GERD, OSA, obesity class III, kidney stones.  He has a family history of bile duct cancer in mother.  ROS: + see HPI + acid reflux  I reviewed pt's medications, allergies, PMH, social hx, family hx, and changes were documented in the history of present illness. Otherwise, unchanged from my initial visit note.  Past Medical History:  Diagnosis Date   Anxiety    Complication of anesthesia    Constipation     Depression    Diabetes mellitus    type 2   GERD (gastroesophageal reflux disease)    Head injury, acute, without loss of consciousness    History of kidney stones    Hyperlipidemia    Hypertension    Kidney stone    MDD (major depressive disorder)    Peripheral neuropathy    PONV (postoperative nausea and vomiting)    post wisdom teeth   Shortness of breath    with exertion   Sinus disorder    Sleep apnea    Past Surgical History:  Procedure Laterality Date   DENTAL SURGERY     INCISION AND DRAINAGE ABSCESS Left 11/12/2013   Procedure: INCISION AND DRAINAGE LEFT CHEEK  ABSCESS;  Surgeon: Christia Reading, MD;  Location: [facility] OR;  Service: ENT;  Laterality: Left;   NASAL SEPTOPLASTY W/ TURBINOPLASTY N/A 04/26/2016   Procedure: NASAL SEPTOPLASTY WITH TURBINATE REDUCTION;  Surgeon: Christia Reading, MD;  Location: [facility] OR;  Service: ENT;  Laterality: N/A;   WISDOM TOOTH EXTRACTION     Social History   Socioeconomic History   Marital status: Married  Occupational History  Social Network engineer strain: Not on file   Food insecurity:    Worry: Not on file    Inability: Not on file   Transportation needs:    Medical: Not on file    Non-medical: Not on file  Tobacco Use   Smoking status: Never Smoker   Smokeless tobacco: Never Used  Substance and Sexual Activity   Alcohol use: No   Drug use: No   Sexual activity: Not on file  Lifestyle   Physical activity:    Days per week: Not on file    Minutes per session: Not on file   Stress: Not on file  Relationships   Social connections:    Talks on phone: Not on file    Gets together: Not on file    Attends religious service: Not on file    Active member of club or organization: Not on file    Attends meetings of clubs or organizations: Not on file    Relationship status: Not on file   Intimate partner violence:    Fear of current or ex partner: Not on file    Emotionally abused: Not on file    Physically abused: Not  on file    Forced sexual activity: Not on file  Other Topics Concern   Not on file  Social History Narrative   Married 1 son   Quarry manager   2-3 caffeine drinks/day   Current Outpatient Medications on File Prior to Visit  Medication Sig Dispense Refill   acetaminophen (TYLENOL) 500 MG tablet Take 1,000 mg by mouth every 6 (six) hours as needed (for pain).      clotrimazole (LOTRIMIN) 1 % cream Apply 1 Application  topically 2 (two) times daily. 30 g 0   Continuous Blood Gluc Sensor (DEXCOM G7 SENSOR) MISC Apply 1 sensor every 10 days as directed 9 each 4   Dulaglutide (TRULICITY) 3 MG/0.5ML SOPN Inject 3 mg into the skin once a week. 6 mL 3   empagliflozin (JARDIANCE) 25 MG TABS tablet Take 1 tablet (25 mg total) by mouth daily before breakfast. 90 tablet 3   fexofenadine (ALLEGRA) 180 MG tablet Take by mouth.     gabapentin (NEURONTIN) 600 MG tablet Take 1.5 tablets (900 mg total) by mouth every 8 (eight) hours. 135 tablet 2   gabapentin (NEURONTIN) 600 MG tablet Take 1 & 1/2 tablets by mouth every 8 hours 405 tablet 1   gabapentin (NEURONTIN) 600 MG tablet Take 1.5 tablets (900 mg total) by mouth every 8 (eight) hours. 405 tablet 1   ibuprofen (ADVIL) 200 MG tablet Take 200 mg by mouth every 6 (six) hours as needed.     icosapent Ethyl (VASCEPA) 1 g capsule Take 2 capsules (2 g total) by mouth 2 (two) times daily with a meal. 120 capsule 6   insulin degludec (TRESIBA FLEXTOUCH) 200 UNIT/ML FlexTouch Pen Inject up to 36 Units into the skin daily. 9 mL 1   Insulin Pen Needle (ULTICARE MICRO PEN NEEDLES) 32G X 4 MM MISC USE ONCE A DAY AS DIRECTED 100 each 3   lisinopril (ZESTRIL) 20 MG tablet Take 1 tablet (20 mg total) by mouth daily. 30 tablet 2   lisinopril (ZESTRIL) 20 MG tablet Take 1 tablet (20 mg total) by mouth daily. 90 tablet 1   metFORMIN (GLUCOPHAGE) 1000 MG tablet Take 2 tablets (2,000 mg total) by mouth at bedtime. 180 tablet 3   Multiple Vitamin (MULTIVITAMIN) tablet  Take 1 tablet by mouth daily.     pantoprazole (PROTONIX) 40 MG tablet Take 1 tablet (40 mg total) by mouth daily. 90 tablet 1   simvastatin (ZOCOR) 40 MG tablet TAKE 1 TABLET BY MOUTH ONCE DAILY 90 tablet 1   simvastatin (ZOCOR) 40 MG tablet Take 1 tablet (40 mg total) by mouth every evening. 90 tablet 1   No current facility-administered medications on file prior to visit.   No Known Allergies Family History  Problem Relation Age of Onset   Cancer Mother    Hyperlipidemia Father    Heart disease Father    Diabetes Father    Heart disease Maternal Grandmother    Diabetes Paternal Grandmother    Stomach cancer Maternal Aunt    Colon cancer Neg Hx    Esophageal cancer Neg Hx    Colon polyps Neg Hx    PE: BP 120/76 (BP Location: Left Arm, Patient Position: Sitting, Cuff Size: Normal)   Pulse 91   Ht 5\' 10"  (1.778 m)   Wt (!) 301 lb 6.4 oz (136.7 kg)   SpO2 99%   BMI 43.25 kg/m  Wt Readings from Last 3 Encounters:  08/05/22 (!) 301 lb 6.4 oz (136.7 kg)  06/01/22 300 lb (136.1 kg)  03/06/22 298 lb 6.4 oz (135.4 kg)   Constitutional: overweight, in NAD Eyes:  EOMI, no exophthalmos ENT: no neck masses, no cervical lymphadenopathy Cardiovascular: RRR, No MRG Respiratory: CTA B Musculoskeletal: no deformities Skin:no rashes Neurological: + tremor with outstretched L hand  ASSESSMENT: 1. DM2, non-insulin-dependent, uncontrolled, with long-term complications -PN -Mild CKD  2. HL  3. Obesity class III  PLAN:  1. Patient with longstanding, uncontrolled, type 2 diabetes, on oral antidiabetic regimen with  metformin and SGLT2 inhibitor, weekly GLP-1 receptor agonist and long-acting insulin, with still poor control.  At last visit, HbA1c was lower, at 7.8%, decreased from 9.7%, after improving compliance with medications and also starting the Dexcom G7 CGM.  Sugars appears to be fluctuating around the upper limit of the target range with hyperglycemic spikes after lunch and  dinner during the time of the holidays.  Due to the improvement in blood sugars, I did not recommend a change in regimen. -At today's visit, he is off his Dexcom sensor after he had 2 sensors that failed.  He has 2 sensors at home and agrees to restart these.  He has also been off Trulicity, unclear why, but when he tried to restart approximately 2 weeks ago, he developed abdominal pain.   -Sugars are now at goal in the morning but they are higher later in the day, almost as high as 300s after meals.  We discussed about the need to restart Trulicity.  He has a 1.5 mg dose at home and I advised him to start with this and then increase to 3 mg weekly afterwards, depending on tolerance.  I strongly advised him to take this consistently.  Will continue the rest of the regimen including his insulin. - I suggested to:  Patient Instructions  Please continue: - Metformin 2000 mg at bedtime - Jardiance 25 mg before b'fast - Tresiba 36 units daily  Restart:  - Trulicity 1.5 mg weekly, then increase 3 mg weekly  Please return in 4 months.  - advised to check sugars at different times of the day - 4x a day, rotating check times - advised for yearly eye exams >> he needs a new appointment - return to clinic in 4 months  2. HL -Reviewed latest lipid panel from 08/01/2022: At goal except triglycerides, which were elevated, but improved -Continue Zocor 40 mg daily and Vascepa 2 g twice a day without side effects  3. Obesity class III -continue SGLT 2 inhibitor and will restart the GLP-1 receptor agonist which should also help with weight loss -Weight was approximately stable at last visit and gained 3 pounds since then  Carlus Pavlov, MD PhD [facility]

## 2022-08-28 ENCOUNTER — Other Ambulatory Visit (HOSPITAL_BASED_OUTPATIENT_CLINIC_OR_DEPARTMENT_OTHER): Payer: Self-pay

## 2022-08-28 ENCOUNTER — Other Ambulatory Visit: Payer: Self-pay

## 2022-09-02 ENCOUNTER — Other Ambulatory Visit (HOSPITAL_BASED_OUTPATIENT_CLINIC_OR_DEPARTMENT_OTHER): Payer: Self-pay

## 2022-09-03 ENCOUNTER — Other Ambulatory Visit (HOSPITAL_BASED_OUTPATIENT_CLINIC_OR_DEPARTMENT_OTHER): Payer: Self-pay

## 2022-09-03 MED ORDER — ICOSAPENT ETHYL 1 G PO CAPS
2.0000 g | ORAL_CAPSULE | Freq: Two times a day (BID) | ORAL | 6 refills | Status: AC
  Filled 2022-09-03: qty 120, 30d supply, fill #0
  Filled 2022-09-26: qty 120, 30d supply, fill #1
  Filled 2022-10-28: qty 120, 30d supply, fill #2
  Filled 2022-11-25: qty 120, 30d supply, fill #3
  Filled 2022-12-25: qty 120, 30d supply, fill #4
  Filled 2023-01-20: qty 120, 30d supply, fill #5
  Filled 2023-03-20: qty 120, 30d supply, fill #6

## 2022-09-16 DIAGNOSIS — H2513 Age-related nuclear cataract, bilateral: Secondary | ICD-10-CM | POA: Diagnosis not present

## 2022-09-16 DIAGNOSIS — H35033 Hypertensive retinopathy, bilateral: Secondary | ICD-10-CM | POA: Diagnosis not present

## 2022-09-16 DIAGNOSIS — E113513 Type 2 diabetes mellitus with proliferative diabetic retinopathy with macular edema, bilateral: Secondary | ICD-10-CM | POA: Diagnosis not present

## 2022-09-16 DIAGNOSIS — E113511 Type 2 diabetes mellitus with proliferative diabetic retinopathy with macular edema, right eye: Secondary | ICD-10-CM | POA: Diagnosis not present

## 2022-09-16 DIAGNOSIS — H43823 Vitreomacular adhesion, bilateral: Secondary | ICD-10-CM | POA: Diagnosis not present

## 2022-09-19 ENCOUNTER — Other Ambulatory Visit (HOSPITAL_BASED_OUTPATIENT_CLINIC_OR_DEPARTMENT_OTHER): Payer: Self-pay

## 2022-09-20 DIAGNOSIS — E113511 Type 2 diabetes mellitus with proliferative diabetic retinopathy with macular edema, right eye: Secondary | ICD-10-CM | POA: Diagnosis not present

## 2022-09-23 DIAGNOSIS — E113512 Type 2 diabetes mellitus with proliferative diabetic retinopathy with macular edema, left eye: Secondary | ICD-10-CM | POA: Diagnosis not present

## 2022-09-25 ENCOUNTER — Other Ambulatory Visit (HOSPITAL_BASED_OUTPATIENT_CLINIC_OR_DEPARTMENT_OTHER): Payer: Self-pay

## 2022-09-30 ENCOUNTER — Other Ambulatory Visit (HOSPITAL_BASED_OUTPATIENT_CLINIC_OR_DEPARTMENT_OTHER): Payer: Self-pay

## 2022-10-01 ENCOUNTER — Other Ambulatory Visit (HOSPITAL_BASED_OUTPATIENT_CLINIC_OR_DEPARTMENT_OTHER): Payer: Self-pay

## 2022-10-02 ENCOUNTER — Other Ambulatory Visit (HOSPITAL_BASED_OUTPATIENT_CLINIC_OR_DEPARTMENT_OTHER): Payer: Self-pay

## 2022-10-03 ENCOUNTER — Other Ambulatory Visit (HOSPITAL_BASED_OUTPATIENT_CLINIC_OR_DEPARTMENT_OTHER): Payer: Self-pay

## 2022-10-23 DIAGNOSIS — H35033 Hypertensive retinopathy, bilateral: Secondary | ICD-10-CM | POA: Diagnosis not present

## 2022-10-23 DIAGNOSIS — H43823 Vitreomacular adhesion, bilateral: Secondary | ICD-10-CM | POA: Diagnosis not present

## 2022-10-23 DIAGNOSIS — E113513 Type 2 diabetes mellitus with proliferative diabetic retinopathy with macular edema, bilateral: Secondary | ICD-10-CM | POA: Diagnosis not present

## 2022-10-23 DIAGNOSIS — H2513 Age-related nuclear cataract, bilateral: Secondary | ICD-10-CM | POA: Diagnosis not present

## 2022-11-07 ENCOUNTER — Other Ambulatory Visit (HOSPITAL_BASED_OUTPATIENT_CLINIC_OR_DEPARTMENT_OTHER): Payer: Self-pay

## 2022-11-20 ENCOUNTER — Other Ambulatory Visit (HOSPITAL_BASED_OUTPATIENT_CLINIC_OR_DEPARTMENT_OTHER): Payer: Self-pay

## 2022-11-20 ENCOUNTER — Ambulatory Visit
Admission: RE | Admit: 2022-11-20 | Discharge: 2022-11-20 | Disposition: A | Discharge status: Home / Self Care | Payer: Commercial Managed Care - PPO | Source: Ambulatory Visit | Attending: Family Medicine | Admitting: Family Medicine

## 2022-11-20 VITALS — BP 120/77 | HR 95 | Temp 97.9°F | Resp 17

## 2022-11-20 DIAGNOSIS — L03116 Cellulitis of left lower limb: Secondary | ICD-10-CM

## 2022-11-20 DIAGNOSIS — Z7984 Long term (current) use of oral hypoglycemic drugs: Secondary | ICD-10-CM | POA: Diagnosis not present

## 2022-11-20 DIAGNOSIS — E1169 Type 2 diabetes mellitus with other specified complication: Secondary | ICD-10-CM | POA: Diagnosis not present

## 2022-11-20 MED ORDER — CEPHALEXIN 500 MG PO CAPS
500.0000 mg | ORAL_CAPSULE | Freq: Three times a day (TID) | ORAL | 0 refills | Status: DC
  Filled 2022-11-20: qty 21, 7d supply, fill #0

## 2022-11-20 NOTE — ED Provider Notes (Signed)
[facility]    CSN: [id number] Arrival date & time: 11/20/22  1254      History   Chief Complaint Chief Complaint  Patient presents with   Cellulitis    Lower LT leg    HPI Victor Estrada is a 52 y.o. male.   HPI  Patient states that his diabetes is well-controlled.  He has recurring problems with cellulitis.  Sometimes in his legs, he has also had it in his face.  Currently has increasing redness, tightness, swelling, and pain in his left leg.  No wound or injury.  No fever or malaise.  States his sugars are "not as good as it should be".  Past Medical History:  Diagnosis Date   Anxiety    Complication of anesthesia    Constipation    Depression    Diabetes mellitus    type 2   GERD (gastroesophageal reflux disease)    Head injury, acute, without loss of consciousness    History of kidney stones    Hyperlipidemia    Hypertension    Kidney stone    MDD (major depressive disorder)    Peripheral neuropathy    PONV (postoperative nausea and vomiting)    post wisdom teeth   Shortness of breath    with exertion   Sinus disorder    Sleep apnea     Patient Active Problem List   Diagnosis Date Noted   Class 3 obesity (HCC) 01/28/2020   Poorly controlled type 2 diabetes mellitus with peripheral neuropathy (HCC) 06/26/2018   Hypersomnia 01/19/2016   OSA (obstructive sleep apnea) 11/03/2015   Abdominal wall strain 07/28/2014   URI (upper respiratory infection) 04/20/2014   Cellulitis and abscess of face 11/11/2013   GERD (gastroesophageal reflux disease) 05/28/2013   Allergic rhinitis 05/28/2013   Routine general medical examination at a health care facility 05/28/2013   Adjustment disorder with mixed anxiety and depressed mood 01/27/2012   Sinusitis 12/20/2011   HTN (hypertension) 05/23/2011   Hyperlipidemia 05/23/2011   Morbid obesity (HCC) 05/23/2011    Past Surgical History:  Procedure Laterality Date   DENTAL SURGERY     INCISION AND DRAINAGE  ABSCESS Left 11/12/2013   Procedure: INCISION AND DRAINAGE LEFT CHEEK  ABSCESS;  Surgeon: Christia Reading, MD;  Location: [facility] OR;  Service: ENT;  Laterality: Left;   NASAL SEPTOPLASTY W/ TURBINOPLASTY N/A 04/26/2016   Procedure: NASAL SEPTOPLASTY WITH TURBINATE REDUCTION;  Surgeon: Christia Reading, MD;  Location: [facility] OR;  Service: ENT;  Laterality: N/A;   WISDOM TOOTH EXTRACTION         Home Medications    Prior to Admission medications   Medication Sig Start Date End Date Taking? Authorizing Provider  cephALEXin (KEFLEX) 500 MG capsule Take 1 capsule (500 mg total) by mouth 3 (three) times daily. 11/20/22  Yes Eustace Moore, MD  acetaminophen (TYLENOL) 500 MG tablet Take 1,000 mg by mouth every 6 (six) hours as needed (for pain).     [provider]  clotrimazole (LOTRIMIN) 1 % cream Apply 1 Application topically 2 (two) times daily. 08/01/22     Continuous Blood Gluc Sensor (DEXCOM G7 SENSOR) MISC Apply 1 sensor every 10 days as directed 08/07/21   Carlus Pavlov, MD  Dulaglutide (TRULICITY) 3 MG/0.5ML SOPN Inject 3 mg into the skin once a week. 08/05/22   Carlus Pavlov, MD  empagliflozin (JARDIANCE) 25 MG TABS tablet Take 1 tablet (25 mg total) by mouth daily before breakfast. 08/05/22  Carlus Pavlov, MD  fexofenadine (ALLEGRA) 180 MG tablet Take by mouth.    [provider]  gabapentin (NEURONTIN) 600 MG tablet Take 1.5 tablets (900 mg total) by mouth every 8 (eight) hours. 12/28/20   Penumalli, Glenford Bayley, MD  gabapentin (NEURONTIN) 600 MG tablet Take 1 & 1/2 tablets by mouth every 8 hours 08/03/21     gabapentin (NEURONTIN) 600 MG tablet Take 1.5 tablets (900 mg total) by mouth every 8 (eight) hours. 08/01/22     ibuprofen (ADVIL) 200 MG tablet Take 200 mg by mouth every 6 (six) hours as needed.    [provider]  icosapent Ethyl (VASCEPA) 1 g capsule Take 2 capsules (2 g total) by mouth 2 (two) times daily. With meals 09/03/22     insulin degludec (TRESIBA  FLEXTOUCH) 200 UNIT/ML FlexTouch Pen Inject up to 36 Units into the skin daily. 08/05/22   Carlus Pavlov, MD  Insulin Pen Needle (ULTICARE MICRO PEN NEEDLES) 32G X 4 MM MISC USE ONCE A DAY AS DIRECTED 03/06/22 03/06/23  Carlus Pavlov, MD  lisinopril (ZESTRIL) 20 MG tablet Take 1 tablet (20 mg total) by mouth daily. 01/31/22 06/30/22    lisinopril (ZESTRIL) 20 MG tablet Take 1 tablet (20 mg total) by mouth daily. 08/01/22     metFORMIN (GLUCOPHAGE) 1000 MG tablet Take 2 tablets (2,000 mg total) by mouth at bedtime. 03/06/22 12/31/22  Carlus Pavlov, MD  Multiple Vitamin (MULTIVITAMIN) tablet Take 1 tablet by mouth daily.    [provider]  pantoprazole (PROTONIX) 40 MG tablet Take 1 tablet (40 mg total) by mouth daily. 08/01/22     simvastatin (ZOCOR) 40 MG tablet TAKE 1 TABLET BY MOUTH ONCE DAILY 12/30/19 12/29/20  Tally Joe, MD  simvastatin (ZOCOR) 40 MG tablet Take 1 tablet (40 mg total) by mouth every evening. 08/01/22       Family History Family History  Problem Relation Age of Onset   Cancer Mother    Hyperlipidemia Father    Heart disease Father    Diabetes Father    Heart disease Maternal Grandmother    Diabetes Paternal Grandmother    Stomach cancer Maternal Aunt    Colon cancer Neg Hx    Esophageal cancer Neg Hx    Colon polyps Neg Hx     Social History Social History   Tobacco Use   Smoking status: Never   Smokeless tobacco: Never  Vaping Use   Vaping status: Never Used  Substance Use Topics   Alcohol use: No   Drug use: No     Allergies   Patient has no known allergies.   Review of Systems Review of Systems See HPI  Physical Exam Triage Vital Signs ED Triage Vitals  Encounter Vitals Group     BP 11/20/22 1307 120/77     Systolic BP Percentile --      Diastolic BP Percentile --      Pulse Rate 11/20/22 1307 95     Resp 11/20/22 1307 17     Temp 11/20/22 1307 97.9 F (36.6 C)     Temp Source 11/20/22 1307 Oral     SpO2 11/20/22 1307 97  %     Weight --      Height --      Head Circumference --      Peak Flow --      Pain Score 11/20/22 1308 1     Pain Loc --      Pain Education --  Exclude from Growth Chart --    No data found.  Updated Vital Signs BP 120/77 (BP Location: Right Arm)   Pulse 95   Temp 97.9 F (36.6 C) (Oral)   Resp 17   SpO2 97%      Physical Exam Constitutional:      General: He is not in acute distress.    Appearance: He is well-developed.  HENT:     Head: Normocephalic and atraumatic.  Eyes:     Conjunctiva/sclera: Conjunctivae normal.     Pupils: Pupils are equal, round, and reactive to light.  Cardiovascular:     Rate and Rhythm: Normal rate.  Pulmonary:     Effort: Pulmonary effort is normal. No respiratory distress.  Abdominal:     General: There is no distension.     Palpations: Abdomen is soft.  Musculoskeletal:        General: Normal range of motion.     Cervical back: Normal range of motion.     Left lower leg: Edema present.  Skin:    General: Skin is warm and dry.     Findings: Erythema present.     Comments: Deep erythema of the skin noted at the ankle with faint erythema to mid shin.  Swelling obvious as opposed to right.  Tender.  No wound  Neurological:     Mental Status: He is alert.      UC Treatments / Results  Labs (all labs ordered are listed, but only abnormal results are displayed) Labs Reviewed - No data to display  EKG   Radiology No results found.  Procedures Procedures (including critical care time)  Medications Ordered in UC Medications - No data to display  Initial Impression / Assessment and Plan / UC Course  I have reviewed the triage vital signs and the nursing notes.  Pertinent labs & imaging results that were available during my care of the patient were reviewed by me and considered in my medical decision making (see chart for details).    Final Clinical Impressions(s) / UC Diagnoses   Final diagnoses:  Cellulitis of  leg, left  Type 2 diabetes mellitus with other specified complication, unspecified whether long term insulin use (HCC)     Discharge Instructions      Take the antibiotic 3 times a day.  Make sure you take at least 2 doses today Elevate the leg and limit walking while it is painful See your doctor in a couple days.   ED Prescriptions     Medication Sig Dispense Auth. Provider   cephALEXin (KEFLEX) 500 MG capsule Take 1 capsule (500 mg total) by mouth 3 (three) times daily. 21 capsule Eustace Moore, MD      PDMP not reviewed this encounter.   Eustace Moore, MD 11/20/22 [phone]

## 2022-11-20 NOTE — Discharge Instructions (Signed)
Take the antibiotic 3 times a day.  Make sure you take at least 2 doses today Elevate the leg and limit walking while it is painful See your doctor in a couple days.

## 2022-11-20 NOTE — ED Triage Notes (Signed)
Pt c/o redness and swelling to lower LT leg. Hx of cellulitis. Denies fever

## 2022-12-02 DIAGNOSIS — H43823 Vitreomacular adhesion, bilateral: Secondary | ICD-10-CM | POA: Diagnosis not present

## 2022-12-02 DIAGNOSIS — H35033 Hypertensive retinopathy, bilateral: Secondary | ICD-10-CM | POA: Diagnosis not present

## 2022-12-02 DIAGNOSIS — H2513 Age-related nuclear cataract, bilateral: Secondary | ICD-10-CM | POA: Diagnosis not present

## 2022-12-02 DIAGNOSIS — E113513 Type 2 diabetes mellitus with proliferative diabetic retinopathy with macular edema, bilateral: Secondary | ICD-10-CM | POA: Diagnosis not present

## 2022-12-06 ENCOUNTER — Ambulatory Visit: Payer: Commercial Managed Care - PPO | Admitting: Internal Medicine

## 2022-12-27 ENCOUNTER — Other Ambulatory Visit (HOSPITAL_BASED_OUTPATIENT_CLINIC_OR_DEPARTMENT_OTHER): Payer: Self-pay

## 2022-12-30 DIAGNOSIS — E113513 Type 2 diabetes mellitus with proliferative diabetic retinopathy with macular edema, bilateral: Secondary | ICD-10-CM | POA: Diagnosis not present

## 2022-12-30 DIAGNOSIS — H59813 Chorioretinal scars after surgery for detachment, bilateral: Secondary | ICD-10-CM | POA: Diagnosis not present

## 2022-12-30 DIAGNOSIS — H43823 Vitreomacular adhesion, bilateral: Secondary | ICD-10-CM | POA: Diagnosis not present

## 2022-12-30 DIAGNOSIS — H35033 Hypertensive retinopathy, bilateral: Secondary | ICD-10-CM | POA: Diagnosis not present

## 2022-12-30 DIAGNOSIS — H2513 Age-related nuclear cataract, bilateral: Secondary | ICD-10-CM | POA: Diagnosis not present

## 2023-01-10 ENCOUNTER — Other Ambulatory Visit (HOSPITAL_COMMUNITY): Payer: Self-pay

## 2023-01-24 ENCOUNTER — Ambulatory Visit: Payer: Commercial Managed Care - PPO | Admitting: Internal Medicine

## 2023-02-17 ENCOUNTER — Other Ambulatory Visit: Payer: Self-pay | Admitting: Internal Medicine

## 2023-02-17 ENCOUNTER — Other Ambulatory Visit (HOSPITAL_BASED_OUTPATIENT_CLINIC_OR_DEPARTMENT_OTHER): Payer: Self-pay

## 2023-02-17 DIAGNOSIS — I10 Essential (primary) hypertension: Secondary | ICD-10-CM | POA: Diagnosis not present

## 2023-02-17 DIAGNOSIS — K219 Gastro-esophageal reflux disease without esophagitis: Secondary | ICD-10-CM | POA: Diagnosis not present

## 2023-02-17 DIAGNOSIS — Z125 Encounter for screening for malignant neoplasm of prostate: Secondary | ICD-10-CM | POA: Diagnosis not present

## 2023-02-17 DIAGNOSIS — G4733 Obstructive sleep apnea (adult) (pediatric): Secondary | ICD-10-CM | POA: Diagnosis not present

## 2023-02-17 DIAGNOSIS — E1165 Type 2 diabetes mellitus with hyperglycemia: Secondary | ICD-10-CM

## 2023-02-17 DIAGNOSIS — Z0000 Encounter for general adult medical examination without abnormal findings: Secondary | ICD-10-CM | POA: Diagnosis not present

## 2023-02-17 DIAGNOSIS — E7800 Pure hypercholesterolemia, unspecified: Secondary | ICD-10-CM | POA: Diagnosis not present

## 2023-02-17 DIAGNOSIS — G629 Polyneuropathy, unspecified: Secondary | ICD-10-CM | POA: Diagnosis not present

## 2023-02-17 DIAGNOSIS — F320 Major depressive disorder, single episode, mild: Secondary | ICD-10-CM | POA: Diagnosis not present

## 2023-02-17 DIAGNOSIS — N529 Male erectile dysfunction, unspecified: Secondary | ICD-10-CM | POA: Diagnosis not present

## 2023-02-17 DIAGNOSIS — E1169 Type 2 diabetes mellitus with other specified complication: Secondary | ICD-10-CM | POA: Diagnosis not present

## 2023-02-17 DIAGNOSIS — R109 Unspecified abdominal pain: Secondary | ICD-10-CM | POA: Diagnosis not present

## 2023-02-17 MED ORDER — ICOSAPENT ETHYL 1 G PO CAPS
2.0000 g | ORAL_CAPSULE | Freq: Two times a day (BID) | ORAL | 5 refills | Status: DC
  Filled 2023-02-17: qty 120, 30d supply, fill #0
  Filled 2023-04-15: qty 120, 30d supply, fill #1
  Filled 2023-05-15: qty 120, 30d supply, fill #2
  Filled 2023-06-19: qty 120, 30d supply, fill #3
  Filled 2023-07-21: qty 120, 30d supply, fill #4
  Filled 2023-08-18: qty 120, 30d supply, fill #5

## 2023-02-17 MED ORDER — GABAPENTIN 600 MG PO TABS
900.0000 mg | ORAL_TABLET | Freq: Three times a day (TID) | ORAL | 1 refills | Status: DC
  Filled 2023-02-17: qty 405, 90d supply, fill #0
  Filled 2023-05-15: qty 405, 90d supply, fill #1

## 2023-02-17 MED ORDER — TRESIBA FLEXTOUCH 200 UNIT/ML ~~LOC~~ SOPN
36.0000 [IU] | PEN_INJECTOR | Freq: Every day | SUBCUTANEOUS | 3 refills | Status: DC
  Filled 2023-02-17: qty 9, 50d supply, fill #0
  Filled 2023-04-07: qty 9, 50d supply, fill #1
  Filled 2023-05-26: qty 9, 50d supply, fill #2
  Filled 2023-07-14: qty 9, 50d supply, fill #3

## 2023-02-17 MED ORDER — SIMVASTATIN 40 MG PO TABS
40.0000 mg | ORAL_TABLET | Freq: Every day | ORAL | 1 refills | Status: DC
  Filled 2023-02-18 – 2023-03-13 (×7): qty 90, 90d supply, fill #0
  Filled 2023-06-11: qty 90, 90d supply, fill #1

## 2023-02-17 MED ORDER — PANTOPRAZOLE SODIUM 40 MG PO TBEC
40.0000 mg | DELAYED_RELEASE_TABLET | Freq: Every day | ORAL | 1 refills | Status: DC
  Filled 2023-02-18: qty 90, 90d supply, fill #0
  Filled 2023-05-19: qty 90, 90d supply, fill #1

## 2023-02-17 MED ORDER — LISINOPRIL 20 MG PO TABS
20.0000 mg | ORAL_TABLET | Freq: Every day | ORAL | 1 refills | Status: DC
  Filled 2023-03-24: qty 90, 90d supply, fill #0
  Filled 2023-06-16: qty 90, 90d supply, fill #1

## 2023-02-18 ENCOUNTER — Other Ambulatory Visit (HOSPITAL_BASED_OUTPATIENT_CLINIC_OR_DEPARTMENT_OTHER): Payer: Self-pay

## 2023-02-18 ENCOUNTER — Other Ambulatory Visit: Payer: Self-pay

## 2023-02-19 ENCOUNTER — Other Ambulatory Visit (HOSPITAL_BASED_OUTPATIENT_CLINIC_OR_DEPARTMENT_OTHER): Payer: Self-pay

## 2023-02-19 DIAGNOSIS — G4733 Obstructive sleep apnea (adult) (pediatric): Secondary | ICD-10-CM | POA: Diagnosis not present

## 2023-02-21 ENCOUNTER — Other Ambulatory Visit (HOSPITAL_BASED_OUTPATIENT_CLINIC_OR_DEPARTMENT_OTHER): Payer: Self-pay

## 2023-02-25 DIAGNOSIS — H43823 Vitreomacular adhesion, bilateral: Secondary | ICD-10-CM | POA: Diagnosis not present

## 2023-02-25 DIAGNOSIS — H59813 Chorioretinal scars after surgery for detachment, bilateral: Secondary | ICD-10-CM | POA: Diagnosis not present

## 2023-02-25 DIAGNOSIS — H2513 Age-related nuclear cataract, bilateral: Secondary | ICD-10-CM | POA: Diagnosis not present

## 2023-02-25 DIAGNOSIS — E113513 Type 2 diabetes mellitus with proliferative diabetic retinopathy with macular edema, bilateral: Secondary | ICD-10-CM | POA: Diagnosis not present

## 2023-02-25 DIAGNOSIS — H35033 Hypertensive retinopathy, bilateral: Secondary | ICD-10-CM | POA: Diagnosis not present

## 2023-02-27 ENCOUNTER — Ambulatory Visit: Payer: Commercial Managed Care - PPO | Admitting: Internal Medicine

## 2023-02-27 ENCOUNTER — Encounter: Payer: Self-pay | Admitting: Internal Medicine

## 2023-02-27 VITALS — BP 120/70 | HR 102 | Ht 70.0 in | Wt 300.0 lb

## 2023-02-27 DIAGNOSIS — Z7984 Long term (current) use of oral hypoglycemic drugs: Secondary | ICD-10-CM | POA: Diagnosis not present

## 2023-02-27 DIAGNOSIS — Z794 Long term (current) use of insulin: Secondary | ICD-10-CM

## 2023-02-27 DIAGNOSIS — E1165 Type 2 diabetes mellitus with hyperglycemia: Secondary | ICD-10-CM

## 2023-02-27 DIAGNOSIS — E1142 Type 2 diabetes mellitus with diabetic polyneuropathy: Secondary | ICD-10-CM | POA: Diagnosis not present

## 2023-02-27 DIAGNOSIS — E66813 Obesity, class 3: Secondary | ICD-10-CM | POA: Diagnosis not present

## 2023-02-27 DIAGNOSIS — E785 Hyperlipidemia, unspecified: Secondary | ICD-10-CM

## 2023-02-27 LAB — POCT GLYCOSYLATED HEMOGLOBIN (HGB A1C): Hemoglobin A1C: 8.4 % — AB (ref 4.0–5.6)

## 2023-02-27 NOTE — Patient Instructions (Addendum)
Please continue: - Metformin 1000 mg 2x a day - Jardiance 25 mg before b'fast - Tresiba 36 units daily  Stop snacking at bedtime.  Please return in 3-4 months.

## 2023-02-27 NOTE — Addendum Note (Signed)
Addended by: Carlus Pavlov on: 02/27/2023 01:52 PM   Modules accepted: Level of Service

## 2023-02-27 NOTE — Progress Notes (Addendum)
Patient ID: Victor Estrada, male   DOB: 12-22-1970, 52 y.o.   MRN: [id number]   HPI: Victor Estrada is a 52 y.o.-year-old male, initially referred by his previous PCP, Dr. Azucena Cecil, returning for follow-up for DM2, dx in 2005, non-insulin-dependent, uncontrolled, with long-term complications (PN, DR). His wife is also my pt: Victor Estrada. He saw Dr. Everardo All in 2013.  Last visit with me 7 months ago.  Interim history: No increased urination, blurry vision, nausea, chest pain. He was in the emergency room for cellulitis several times in the last year, with the latest being in 10/2022. He is off CPAP - will restart as he was feeling better on this. He will start to see a counselor.  I discussed with PCP about his depression, which influences his diabetes care. Since last visit, he developed a floater.  He was seen by optometry and referred to a retinal specialist.  He was told he had diabetic retinopathy and had a procedure done.  He continues to follow with him.  Reviewed latest HbA1c level: 07/31/2022: HbA1c 8.7% Lab Results  Component Value Date   HGBA1C 7.8 (A) 03/06/2022   HGBA1C 10.5 12/30/2019   HGBA1C 9.8 04/13/2019   HGBA1C 11.1 (H) 11/03/2015   HGBA1C 10.5 (H) 02/06/2015   HGBA1C 6.6 (H) 09/14/2013   HGBA1C 6.8 (H) 05/28/2013   HGBA1C 10.5 (H) 01/27/2012   HGBA1C 9.9 (H) 10/01/2011   HGBA1C 9.9 (H) 05/23/2011  08/03/2021: HbA1c 9.7% 01/16/2021: HbA1c 7.7% 06/30/2020: HbA1c 7.7% 05/19/2018: HbA1c 11% 04/01/2017: HbA1c 11.2%  Previously on: - Janumet 50-1000 mg 2x a day, with meals - Jardiance 10 mg before breakfast He tried Glimepiride in the past.  Now on: - Metformin 1000 mg 2x a day with meals >> 2000 mg daily at night (bedtime) >> 1000 mg with supper, 1000 mg at bedtime - Jardiance 10 >> 25 mg before b'fast - Trulicity 0.75 >> 1.5 >> 3 mg weekly >> AP >> 1.5 mg  >> stopped few mo ago 2/2 AP - Tresiba 14 units (started 04/2020) >> 32 >> 40 >> 36 units daily  He checks sugars  >4x with the Dexcom CGM - started 10 days ago:  Prev.: - am: 95-130 - 2h after b'fast: <200 - lunch: n/c - 2h after lunch: <300 - dinner: n/c - 2h after dinner: n/c - bedtime: n/c  Previously:   Lowest sugar was 100 >> 88, 111 >> 95 >>  112; he has hypoglycemia awareness in the 90s. Highest sugar was 350 >> 280 >> 300>> 270 >> <300 >> 300.  Glucometer: One Touch ultra >> Freestyle  Pt's meals are: - Breakfast: skips >> old-fashioned oatmeal with fruit - Lunch: lean cuisine, fast food - Dinner: same - Snacks: protein bars, PB crackers, fruit Stopped regular sodas.  -+ Mild CKD, last BUN/creatinine:   07/31/2022: 31/1.18, GFR 75, glucose 203 01/30/2022: 32/1.18, GFR 75, glucose 125 08/03/2021: 27/1.11, GFR 81 01/16/2021: 27/21.04, GFR 88, glucose 124 06/30/2020: 27/1.0, GFR 92, glucose 121 Lab Results  Component Value Date   BUN 23 (H) 11/09/2018   BUN 19 04/23/2016   CREATININE 1.09 11/09/2018   CREATININE 1.29 (H) 04/23/2016  No results found for: "MICRALBCREAT" On lisinopril 20.  -+ HL; last set of lipids:  08/01/2022: 126/283/38/45 01/30/2022: 160/440/33/60 08/03/2021: 171/349/36/78 01/16/2021: 132/228/35/60 06/30/2020: 142/249/30/71 05/19/2018: 368/1181/33/not calculated Lab Results  Component Value Date   CHOL 151 11/03/2015   HDL 37 (L) 11/03/2015   LDLCALC 48 11/03/2015   LDLDIRECT 92.0 02/06/2015  TRIG 330 (H) 11/03/2015   CHOLHDL 4.1 11/03/2015  On Zocor 40, Vascepa added 04/2018.  - last eye exam was in 2024: + DR reportedly.   -+ Numbness and tingling in his feet.  On Neurontin 600 >> 900 mg 2x >> 3x a day.  Last foot exam 03/06/2022.  Pt has FH of DM in father.  She also has a history of HTN, GERD, OSA, obesity class III, kidney stones.  He has a family history of bile duct cancer in mother.  ROS: + see HPI + acid reflux  I reviewed pt's medications, allergies, PMH, social hx, family hx, and changes were documented in the history of present  illness. Otherwise, unchanged from my initial visit note.  Past Medical History:  Diagnosis Date   Anxiety    Complication of anesthesia    Constipation    Depression    Diabetes mellitus    type 2   GERD (gastroesophageal reflux disease)    Head injury, acute, without loss of consciousness    History of kidney stones    Hyperlipidemia    Hypertension    Kidney stone    MDD (major depressive disorder)    Peripheral neuropathy    PONV (postoperative nausea and vomiting)    post wisdom teeth   Shortness of breath    with exertion   Sinus disorder    Sleep apnea    Past Surgical History:  Procedure Laterality Date   DENTAL SURGERY     INCISION AND DRAINAGE ABSCESS Left 11/12/2013   Procedure: INCISION AND DRAINAGE LEFT CHEEK  ABSCESS;  Surgeon: Christia Reading, MD;  Location: [facility] OR;  Service: ENT;  Laterality: Left;   NASAL SEPTOPLASTY W/ TURBINOPLASTY N/A 04/26/2016   Procedure: NASAL SEPTOPLASTY WITH TURBINATE REDUCTION;  Surgeon: Christia Reading, MD;  Location: [facility] OR;  Service: ENT;  Laterality: N/A;   WISDOM TOOTH EXTRACTION     Social History   Socioeconomic History   Marital status: Married  Occupational History  Social Network engineer strain: Not on file   Food insecurity:    Worry: Not on file    Inability: Not on file   Transportation needs:    Medical: Not on file    Non-medical: Not on file  Tobacco Use   Smoking status: Never Smoker   Smokeless tobacco: Never Used  Substance and Sexual Activity   Alcohol use: No   Drug use: No   Sexual activity: Not on file  Lifestyle   Physical activity:    Days per week: Not on file    Minutes per session: Not on file   Stress: Not on file  Relationships   Social connections:    Talks on phone: Not on file    Gets together: Not on file    Attends religious service: Not on file    Active member of club or organization: Not on file    Attends meetings of clubs or organizations: Not on file    Relationship  status: Not on file   Intimate partner violence:    Fear of current or ex partner: Not on file    Emotionally abused: Not on file    Physically abused: Not on file    Forced sexual activity: Not on file  Other Topics Concern   Not on file  Social History Narrative   Married 1 son   Quarry manager   2-3 caffeine drinks/day   Current Outpatient Medications on File Prior to  Visit  Medication Sig Dispense Refill   acetaminophen (TYLENOL) 500 MG tablet Take 1,000 mg by mouth every 6 (six) hours as needed (for pain).      cephALEXin (KEFLEX) 500 MG capsule Take 1 capsule (500 mg total) by mouth 3 (three) times daily. 21 capsule 0   clotrimazole (LOTRIMIN) 1 % cream Apply 1 Application topically 2 (two) times daily. 30 g 0   Continuous Blood Gluc Sensor (DEXCOM G7 SENSOR) MISC Apply 1 sensor every 10 days as directed 9 each 4   Dulaglutide (TRULICITY) 3 MG/0.5ML SOPN Inject 3 mg into the skin once a week. 6 mL 3   empagliflozin (JARDIANCE) 25 MG TABS tablet Take 1 tablet (25 mg total) by mouth daily before breakfast. 90 tablet 3   fexofenadine (ALLEGRA) 180 MG tablet Take by mouth.     gabapentin (NEURONTIN) 600 MG tablet Take 1.5 tablets (900 mg total) by mouth every 8 (eight) hours. 135 tablet 2   gabapentin (NEURONTIN) 600 MG tablet Take 1 & 1/2 tablets by mouth every 8 hours 405 tablet 1   gabapentin (NEURONTIN) 600 MG tablet Take 1.5 tablets (900 mg total) by mouth every 8 (eight) hours. 405 tablet 1   gabapentin (NEURONTIN) 600 MG tablet Take 1.5 tablets (900 mg total) by mouth every 8 (eight) hours. 405 tablet 1   ibuprofen (ADVIL) 200 MG tablet Take 200 mg by mouth every 6 (six) hours as needed.     icosapent Ethyl (VASCEPA) 1 g capsule Take 2 capsules (2 g total) by mouth 2 (two) times daily. With meals 120 capsule 6   icosapent Ethyl (VASCEPA) 1 g capsule Take 2 capsules (2 g total) by mouth 2 (two) times daily. 120 capsule 5   insulin degludec (TRESIBA FLEXTOUCH) 200 UNIT/ML  FlexTouch Pen Inject up to 36 Units into the skin daily. 9 mL 3   Insulin Pen Needle (ULTICARE MICRO PEN NEEDLES) 32G X 4 MM MISC USE ONCE A DAY AS DIRECTED 100 each 3   lisinopril (ZESTRIL) 20 MG tablet Take 1 tablet (20 mg total) by mouth daily. 30 tablet 2   lisinopril (ZESTRIL) 20 MG tablet Take 1 tablet (20 mg total) by mouth daily. 90 tablet 1   metFORMIN (GLUCOPHAGE) 1000 MG tablet Take 2 tablets (2,000 mg total) by mouth at bedtime. 180 tablet 3   Multiple Vitamin (MULTIVITAMIN) tablet Take 1 tablet by mouth daily.     pantoprazole (PROTONIX) 40 MG tablet Take 1 tablet (40 mg total) by mouth daily. 90 tablet 1   simvastatin (ZOCOR) 40 MG tablet TAKE 1 TABLET BY MOUTH ONCE DAILY 90 tablet 1   simvastatin (ZOCOR) 40 MG tablet Take 1 tablet (40 mg total) by mouth daily. 90 tablet 1   No current facility-administered medications on file prior to visit.   No Known Allergies Family History  Problem Relation Age of Onset   Cancer Mother    Hyperlipidemia Father    Heart disease Father    Diabetes Father    Heart disease Maternal Grandmother    Diabetes Paternal Grandmother    Stomach cancer Maternal Aunt    Colon cancer Neg Hx    Esophageal cancer Neg Hx    Colon polyps Neg Hx    PE: BP 120/70   Pulse (!) 102   Ht 5\' 10"  (1.778 m)   Wt 300 lb (136.1 kg)   SpO2 96%   BMI 43.05 kg/m  Wt Readings from Last 3 Encounters:  02/27/23 300 lb (  136.1 kg)  08/05/22 (!) 301 lb 6.4 oz (136.7 kg)  06/01/22 300 lb (136.1 kg)   Constitutional: overweight, in NAD Eyes:  EOMI, no exophthalmos ENT: no neck masses, no cervical lymphadenopathy Cardiovascular: tachycardia, RR, No MRG Respiratory: CTA B Musculoskeletal: no deformities Skin:no rashes Neurological: + tremor with outstretched L hand Diabetic Foot Exam - Simple   Simple Foot Form Diabetic Foot exam was performed with the following findings: Yes 02/27/2023  1:26 PM  Visual Inspection No deformities, no ulcerations, no other  skin breakdown bilaterally: Yes Sensation Testing Intact to touch and monofilament testing bilaterally: Yes Pulse Check Posterior Tibialis and Dorsalis pulse intact bilaterally: Yes Comments Decreased sensation to monofilament R forefoot.    ASSESSMENT: 1. DM2, non-insulin-dependent, uncontrolled, with long-term complications -PN -Mild CKD  2. HL  3. Obesity class III  PLAN:  1. Patient with longstanding, uncontrolled, type 2 diabetes, on oral antidiabetic regimen with metformin and SGLT2 inhibitor along with long-acting insulin with improving control.  At last visit, HbA1c was higher, at 8.4%, increased from 7.8%.  In the past, sugars improved after he started to improve compliance with medications and after starting a Dexcom CGM.  He was off the sensor at last visit after having had 2 sensor failed.  We discussed about restarting this.  He was also off Trulicity and mentions that he tried to restart it but developed abdominal pain.  We discussed about trying to start at a lower dose, and increase as tolerated.  Sugars were at goal in the morning but quite high later in the day, as high as 300s after meals. -At today's visit, he tells me that he tried Trulicity at different doses that he could not tolerate them due to abdominal pain.  He is off the GLP-1 receptor agonist for now. CGM interpretations: -At today's visit, we reviewed his CGM downloads: It appears that 67% of values are in target range (goal >70%), while 33% are higher than 180 (goal <25%), and 0% are lower than 70 (goal <4%).  The calculated average blood sugar is 170.  The projected HbA1c for the next 3 months (GMI) is 7.4%. -Reviewing the CGM trends, sugars appear to have been higher during Thanksgiving, but they improved significantly afterwards.  He still has occasional higher blood sugars after meals, but not enough to trigger a change in regimen.  He is working on improving his diet and he had several questions about how  to do so at today's visit.  We discussed about ways to improve his postprandial blood sugars including eating a salad before meals, including more nonstarchy vegetables, adding protein and healthy fats (chia seeds, flaxseeds, unsalted nuts) to his oatmeal, since he does see hyperglycemic peaks after this usually. -He is taking the second dose of metformin and that time now along with a snack.  I advised him to move metformin to breakfast time and to stop the snack at bedtime.  Will continue the rest of the regimen. - I suggested to:  Patient Instructions  Please continue: - Metformin 1000 mg 2x a day - Jardiance 25 mg before b'fast - Tresiba 36 units daily  Stop snacking at bedtime.  Please return in 3-4 months.  - we checked his HbA1c: 8.4% (slightly lower) - advised to check sugars at different times of the day - 4x a day, rotating check times - advised for yearly eye exams >> he is UTD - return to clinic in 3-4 months  2. HL - Reviewed latest lipid panel  from 01/2023: Still elevated, LDL also increased, slightly above target -He continues on Zocor 40 mg daily and Vascepa 2 g twice a day without side effects  3. Obesity class III -continue SGLT 2 inhibitor which should also help with weight loss.  We restarted Trulicity at last visit but unfortunately he could not tolerate it due to abdominal pain. -He gained 3 pounds before last visit and lost 1 pound since then  Component     Latest Ref Rng 02/27/2023  Hemoglobin A1C     4.0 - 5.6 % 8.4 !   Creatinine, Urine     20 - 320 mg/dL 161   Microalb, Ur     mg/dL 9.2   MICROALB/CREAT RATIO     <30 mg/g creat 88 (H)   ACR is elevated despite ACE inhibitor and SGLT2 inhibitor.  We will repeat this at next visit, however, in the light of his decline in GFR, I would suggest a referral to nephrology.  I will advise him to discuss with PCP about this.  Carlus Pavlov, MD PhD [facility] Endocrinology

## 2023-02-28 ENCOUNTER — Ambulatory Visit: Payer: Commercial Managed Care - PPO | Admitting: Internal Medicine

## 2023-02-28 LAB — MICROALBUMIN / CREATININE URINE RATIO
Creatinine, Urine: 104 mg/dL (ref 20–320)
Microalb Creat Ratio: 88 mg/g{creat} — ABNORMAL HIGH (ref <30)
Microalb, Ur: 9.2 mg/dL

## 2023-03-03 ENCOUNTER — Other Ambulatory Visit (HOSPITAL_BASED_OUTPATIENT_CLINIC_OR_DEPARTMENT_OTHER): Payer: Self-pay

## 2023-03-03 ENCOUNTER — Other Ambulatory Visit: Payer: Self-pay

## 2023-03-03 ENCOUNTER — Other Ambulatory Visit: Payer: Self-pay | Admitting: Internal Medicine

## 2023-03-03 MED ORDER — TECHLITE PLUS PEN NEEDLES 32G X 4 MM MISC
1.0000 | Freq: Every day | 3 refills | Status: DC
  Filled 2023-03-03: qty 100, 90d supply, fill #0
  Filled 2023-08-08: qty 100, 90d supply, fill #1
  Filled 2023-10-30: qty 100, 90d supply, fill #2
  Filled 2024-01-28: qty 100, 90d supply, fill #3

## 2023-03-07 ENCOUNTER — Other Ambulatory Visit (HOSPITAL_BASED_OUTPATIENT_CLINIC_OR_DEPARTMENT_OTHER): Payer: Self-pay

## 2023-03-10 ENCOUNTER — Other Ambulatory Visit (HOSPITAL_BASED_OUTPATIENT_CLINIC_OR_DEPARTMENT_OTHER): Payer: Self-pay

## 2023-03-11 ENCOUNTER — Other Ambulatory Visit: Payer: Self-pay

## 2023-03-12 ENCOUNTER — Other Ambulatory Visit (HOSPITAL_BASED_OUTPATIENT_CLINIC_OR_DEPARTMENT_OTHER): Payer: Self-pay

## 2023-03-13 ENCOUNTER — Other Ambulatory Visit (HOSPITAL_BASED_OUTPATIENT_CLINIC_OR_DEPARTMENT_OTHER): Payer: Self-pay

## 2023-03-24 ENCOUNTER — Other Ambulatory Visit (HOSPITAL_BASED_OUTPATIENT_CLINIC_OR_DEPARTMENT_OTHER): Payer: Self-pay

## 2023-03-24 ENCOUNTER — Other Ambulatory Visit: Payer: Self-pay | Admitting: Internal Medicine

## 2023-03-24 ENCOUNTER — Other Ambulatory Visit: Payer: Self-pay

## 2023-03-24 DIAGNOSIS — E1165 Type 2 diabetes mellitus with hyperglycemia: Secondary | ICD-10-CM

## 2023-03-24 DIAGNOSIS — G4733 Obstructive sleep apnea (adult) (pediatric): Secondary | ICD-10-CM | POA: Diagnosis not present

## 2023-03-24 MED ORDER — METFORMIN HCL 1000 MG PO TABS
2000.0000 mg | ORAL_TABLET | Freq: Every day | ORAL | 3 refills | Status: DC
  Filled 2023-03-24: qty 180, 90d supply, fill #0
  Filled 2023-06-16: qty 180, 90d supply, fill #1
  Filled 2023-09-15: qty 180, 90d supply, fill #2

## 2023-03-25 DIAGNOSIS — E113511 Type 2 diabetes mellitus with proliferative diabetic retinopathy with macular edema, right eye: Secondary | ICD-10-CM | POA: Diagnosis not present

## 2023-03-29 ENCOUNTER — Other Ambulatory Visit (HOSPITAL_BASED_OUTPATIENT_CLINIC_OR_DEPARTMENT_OTHER): Payer: Self-pay

## 2023-03-29 ENCOUNTER — Telehealth: Payer: Commercial Managed Care - PPO | Admitting: Family Medicine

## 2023-03-29 DIAGNOSIS — B9789 Other viral agents as the cause of diseases classified elsewhere: Secondary | ICD-10-CM

## 2023-03-29 DIAGNOSIS — J329 Chronic sinusitis, unspecified: Secondary | ICD-10-CM | POA: Diagnosis not present

## 2023-03-29 MED ORDER — FLUTICASONE PROPIONATE 50 MCG/ACT NA SUSP
2.0000 | Freq: Every day | NASAL | 6 refills | Status: DC
  Filled 2023-03-29 – 2023-04-02 (×2): qty 16, 30d supply, fill #0

## 2023-03-29 NOTE — Progress Notes (Signed)
 E-Visit for Sinus Problems  We are sorry that you are not feeling well.  Here is how we plan to help!  Based on what you have shared with me it looks like you have sinusitis.  Sinusitis is inflammation and infection in the sinus cavities of the head.  Based on your presentation I believe you most likely have Acute Viral Sinusitis.This is an infection most likely caused by a virus. There is not specific treatment for viral sinusitis other than to help you with the symptoms until the infection runs its course.  You may use an oral decongestant such as Mucinex  D or if you have glaucoma or high blood pressure use plain Mucinex . Saline nasal spray help and can safely be used as often as needed for congestion, I have prescribed: Fluticasone  nasal spray two sprays in each nostril once a day  Some authorities believe that zinc sprays or the use of Echinacea may shorten the course of your symptoms.  Sinus infections are not as easily transmitted as other respiratory infection, however we still recommend that you avoid close contact with loved ones, especially the very young and elderly.  Remember to wash your hands thoroughly throughout the day as this is the number one way to prevent the spread of infection!  Home Care: Only take medications as instructed by your medical team. Do not take these medications with alcohol. A steam or ultrasonic humidifier can help congestion.  You can place a towel over your head and breathe in the steam from hot water  coming from a faucet. Avoid close contacts especially the very young and the elderly. Cover your mouth when you cough or sneeze. Always remember to wash your hands.  Get Help Right Away If: You develop worsening fever or sinus pain. You develop a severe head ache or visual changes. Your symptoms persist after you have completed your treatment plan.  Make sure you Understand these instructions. Will watch your condition. Will get help right away if you  are not doing well or get worse.   Thank you for choosing an e-visit.  Your e-visit answers were reviewed by a board certified advanced clinical practitioner to complete your personal care plan. Depending upon the condition, your plan could have included both over the counter or prescription medications.  Please review your pharmacy choice. Make sure the pharmacy is open so you can pick up prescription now. If there is a problem, you may contact your provider through [employer] and have the prescription routed to another pharmacy.  Your safety is important to us . If you have drug allergies check your prescription carefully.   For the next 24 hours you can use MyChart to ask questions about today's visit, request a non-urgent call back, or ask for a work or school excuse. You will get an email in the next two days asking about your experience. I hope that your e-visit has been valuable and will speed your recovery.   have provided 5 minutes of non face to face time during this encounter for chart review and documentation.

## 2023-04-01 ENCOUNTER — Other Ambulatory Visit (HOSPITAL_BASED_OUTPATIENT_CLINIC_OR_DEPARTMENT_OTHER): Payer: Self-pay

## 2023-04-02 ENCOUNTER — Other Ambulatory Visit (HOSPITAL_BASED_OUTPATIENT_CLINIC_OR_DEPARTMENT_OTHER): Payer: Self-pay

## 2023-04-03 ENCOUNTER — Other Ambulatory Visit (HOSPITAL_BASED_OUTPATIENT_CLINIC_OR_DEPARTMENT_OTHER): Payer: Self-pay

## 2023-04-03 DIAGNOSIS — G4733 Obstructive sleep apnea (adult) (pediatric): Secondary | ICD-10-CM | POA: Diagnosis not present

## 2023-04-07 ENCOUNTER — Other Ambulatory Visit (HOSPITAL_BASED_OUTPATIENT_CLINIC_OR_DEPARTMENT_OTHER): Payer: Self-pay

## 2023-04-24 DIAGNOSIS — G4733 Obstructive sleep apnea (adult) (pediatric): Secondary | ICD-10-CM | POA: Diagnosis not present

## 2023-05-21 DIAGNOSIS — E113512 Type 2 diabetes mellitus with proliferative diabetic retinopathy with macular edema, left eye: Secondary | ICD-10-CM | POA: Diagnosis not present

## 2023-05-23 DIAGNOSIS — G4733 Obstructive sleep apnea (adult) (pediatric): Secondary | ICD-10-CM | POA: Diagnosis not present

## 2023-05-26 ENCOUNTER — Other Ambulatory Visit (HOSPITAL_BASED_OUTPATIENT_CLINIC_OR_DEPARTMENT_OTHER): Payer: Self-pay

## 2023-06-09 ENCOUNTER — Other Ambulatory Visit (HOSPITAL_BASED_OUTPATIENT_CLINIC_OR_DEPARTMENT_OTHER): Payer: Self-pay

## 2023-06-22 DIAGNOSIS — G4733 Obstructive sleep apnea (adult) (pediatric): Secondary | ICD-10-CM | POA: Diagnosis not present

## 2023-07-14 ENCOUNTER — Other Ambulatory Visit (HOSPITAL_BASED_OUTPATIENT_CLINIC_OR_DEPARTMENT_OTHER): Payer: Self-pay

## 2023-07-14 ENCOUNTER — Ambulatory Visit: Payer: Commercial Managed Care - PPO | Admitting: Internal Medicine

## 2023-07-23 DIAGNOSIS — G4733 Obstructive sleep apnea (adult) (pediatric): Secondary | ICD-10-CM | POA: Diagnosis not present

## 2023-08-02 DIAGNOSIS — G4733 Obstructive sleep apnea (adult) (pediatric): Secondary | ICD-10-CM | POA: Diagnosis not present

## 2023-08-08 ENCOUNTER — Other Ambulatory Visit (HOSPITAL_BASED_OUTPATIENT_CLINIC_OR_DEPARTMENT_OTHER): Payer: Self-pay

## 2023-08-18 ENCOUNTER — Other Ambulatory Visit (HOSPITAL_BASED_OUTPATIENT_CLINIC_OR_DEPARTMENT_OTHER): Payer: Self-pay

## 2023-08-18 ENCOUNTER — Other Ambulatory Visit: Payer: Self-pay | Admitting: Internal Medicine

## 2023-08-19 ENCOUNTER — Other Ambulatory Visit (HOSPITAL_BASED_OUTPATIENT_CLINIC_OR_DEPARTMENT_OTHER): Payer: Self-pay

## 2023-08-19 ENCOUNTER — Other Ambulatory Visit: Payer: Self-pay

## 2023-08-19 MED ORDER — EMPAGLIFLOZIN 25 MG PO TABS
25.0000 mg | ORAL_TABLET | Freq: Every day | ORAL | 3 refills | Status: AC
  Filled 2023-08-19: qty 90, 90d supply, fill #0
  Filled 2023-11-17: qty 90, 90d supply, fill #1
  Filled 2024-02-16 – 2024-02-23 (×2): qty 90, 90d supply, fill #2

## 2023-08-20 ENCOUNTER — Other Ambulatory Visit (HOSPITAL_BASED_OUTPATIENT_CLINIC_OR_DEPARTMENT_OTHER): Payer: Self-pay

## 2023-08-20 MED ORDER — GABAPENTIN 600 MG PO TABS
900.0000 mg | ORAL_TABLET | Freq: Three times a day (TID) | ORAL | 2 refills | Status: AC
  Filled 2023-08-20: qty 135, 30d supply, fill #0
  Filled 2023-09-17: qty 135, 30d supply, fill #1
  Filled 2024-01-13: qty 135, 30d supply, fill #2

## 2023-08-22 ENCOUNTER — Other Ambulatory Visit (HOSPITAL_BASED_OUTPATIENT_CLINIC_OR_DEPARTMENT_OTHER): Payer: Self-pay

## 2023-08-22 DIAGNOSIS — G4733 Obstructive sleep apnea (adult) (pediatric): Secondary | ICD-10-CM | POA: Diagnosis not present

## 2023-08-22 MED ORDER — PANTOPRAZOLE SODIUM 40 MG PO TBEC
40.0000 mg | DELAYED_RELEASE_TABLET | Freq: Every day | ORAL | 0 refills | Status: DC
  Filled 2023-08-22: qty 90, 90d supply, fill #0

## 2023-08-31 ENCOUNTER — Ambulatory Visit
Admission: RE | Admit: 2023-08-31 | Discharge: 2023-08-31 | Disposition: A | Discharge status: Home / Self Care | Source: Ambulatory Visit | Attending: Family Medicine | Admitting: Family Medicine

## 2023-08-31 VITALS — BP 101/69 | HR 92 | Temp 99.2°F | Resp 18 | Ht 70.0 in | Wt 310.0 lb

## 2023-08-31 DIAGNOSIS — Z7984 Long term (current) use of oral hypoglycemic drugs: Secondary | ICD-10-CM | POA: Diagnosis not present

## 2023-08-31 DIAGNOSIS — E119 Type 2 diabetes mellitus without complications: Secondary | ICD-10-CM | POA: Diagnosis not present

## 2023-08-31 DIAGNOSIS — L03116 Cellulitis of left lower limb: Secondary | ICD-10-CM | POA: Diagnosis not present

## 2023-08-31 MED ORDER — CEFADROXIL 500 MG PO CAPS: 500.0000 mg | ORAL_CAPSULE | Freq: Two times a day (BID) | ORAL | 0 refills | Status: DC

## 2023-08-31 NOTE — Discharge Instructions (Signed)
 Take the Duricef 2 times a day for a week Limit walking until improved May take Tylenol  as needed for pain Watch your blood sugars while fighting an infection See your primary care doctor in follow-up

## 2023-08-31 NOTE — ED Provider Notes (Signed)
 [facility]    CSN: [id number] Arrival date & time: 08/31/23  1331      History   Chief Complaint Chief Complaint  Patient presents with   Leg Pain    I've had cellulitis before and this seems like it. - Entered by patient    HPI Victor Estrada is a 53 y.o. male.   Patient known to me from prior visit.  He is a well-controlled diabetic.  I saw him for cellulitis of his leg over a year ago.  He states this time he is coming in early because last time he was very sick with spreading cellulitis and fever.  This time he woke up with cellulitis this morning.  He did have some chills yesterday.  He does have some ingrown toenail problems.  No other open wound on his feet.  Sugars have been well-controlled.  He does have some peripheral neuropathy.    Past Medical History:  Diagnosis Date   Anxiety    Complication of anesthesia    Constipation    Depression    Diabetes mellitus    type 2   GERD (gastroesophageal reflux disease)    Head injury, acute, without loss of consciousness    History of kidney stones    Hyperlipidemia    Hypertension    Kidney stone    MDD (major depressive disorder)    Peripheral neuropathy    PONV (postoperative nausea and vomiting)    post wisdom teeth   Shortness of breath    with exertion   Sinus disorder    Sleep apnea     Patient Active Problem List   Diagnosis Date Noted   Class 3 obesity 01/28/2020   Poorly controlled type 2 diabetes mellitus with peripheral neuropathy (HCC) 06/26/2018   Hypersomnia 01/19/2016   OSA (obstructive sleep apnea) 11/03/2015   Abdominal wall strain 07/28/2014   URI (upper respiratory infection) 04/20/2014   Cellulitis and abscess of face 11/11/2013   GERD (gastroesophageal reflux disease) 05/28/2013   Allergic rhinitis 05/28/2013   Routine general medical examination at a health care facility 05/28/2013   Adjustment disorder with mixed anxiety and depressed mood 01/27/2012   Sinusitis  12/20/2011   HTN (hypertension) 05/23/2011   Hyperlipidemia 05/23/2011   Morbid obesity (HCC) 05/23/2011    Past Surgical History:  Procedure Laterality Date   DENTAL SURGERY     INCISION AND DRAINAGE ABSCESS Left 11/12/2013   Procedure: INCISION AND DRAINAGE LEFT CHEEK  ABSCESS;  Surgeon: Virgina Grills, MD;  Location: [facility] OR;  Service: ENT;  Laterality: Left;   NASAL SEPTOPLASTY W/ TURBINOPLASTY N/A 04/26/2016   Procedure: NASAL SEPTOPLASTY WITH TURBINATE REDUCTION;  Surgeon: Virgina Grills, MD;  Location: [facility] OR;  Service: ENT;  Laterality: N/A;   WISDOM TOOTH EXTRACTION         Home Medications    Prior to Admission medications   Medication Sig Start Date End Date Taking? Authorizing Provider  acetaminophen  (TYLENOL ) 500 MG tablet Take 1,000 mg by mouth every 6 (six) hours as needed (for pain).    Yes [provider]  cefadroxil (DURICEF) 500 MG capsule Take 1 capsule (500 mg total) by mouth 2 (two) times daily. 08/31/23  Yes Stephany Ehrich, MD  Continuous Blood Gluc Sensor (DEXCOM G7 SENSOR) MISC Apply 1 sensor every 10 days as directed 08/07/21  Yes Emilie Harden, MD  empagliflozin  (JARDIANCE ) 25 MG TABS tablet Take 1 tablet (25 mg total) by mouth daily before breakfast. 08/19/23  Yes Emilie Harden, MD  gabapentin  (NEURONTIN ) 600 MG tablet Take 1.5 tablets (900 mg total) by mouth every 8 (eight) hours. 12/28/20  Yes Penumalli, Vikram R, MD  gabapentin  (NEURONTIN ) 600 MG tablet Take 1 & 1/2 tablets by mouth every 8 hours 08/03/21  Yes   gabapentin  (NEURONTIN ) 600 MG tablet Take 1.5 tablets (900 mg total) by mouth every 8 (eight) hours. 08/01/22  Yes   gabapentin  (NEURONTIN ) 600 MG tablet Take 1.5 tablets (900 mg total) by mouth every 8 (eight) hours. 08/20/23 11/18/23 Yes   ibuprofen (ADVIL) 200 MG tablet Take 200 mg by mouth every 6 (six) hours as needed.   Yes [provider]  icosapent  Ethyl (VASCEPA ) 1 g capsule Take 2 capsules (2 g total) by mouth 2 (two) times  daily. With meals 09/03/22  Yes   icosapent  Ethyl (VASCEPA ) 1 g capsule Take 2 capsules (2 g total) by mouth 2 (two) times daily. 02/17/23  Yes   insulin  degludec (TRESIBA  FLEXTOUCH) 200 UNIT/ML FlexTouch Pen Inject up to 36 Units into the skin daily. 02/17/23  Yes Emilie Harden, MD  Insulin  Pen Needle (TECHLITE PLUS PEN NEEDLES) 32G X 4 MM MISC Use once a day as directed. 03/03/23 03/02/24 Yes Emilie Harden, MD  lisinopril  (ZESTRIL ) 20 MG tablet Take 1 tablet (20 mg total) by mouth daily. 02/17/23  Yes   metFORMIN  (GLUCOPHAGE ) 1000 MG tablet Take 2 tablets (2,000 mg total) by mouth at bedtime. 03/24/23 09/20/23 Yes Emilie Harden, MD  Multiple Vitamin (MULTIVITAMIN) tablet Take 1 tablet by mouth daily.   Yes [provider]  pantoprazole  (PROTONIX ) 40 MG tablet Take 1 tablet (40 mg total) by mouth daily. 08/21/23  Yes   simvastatin  (ZOCOR ) 40 MG tablet Take 1 tablet (40 mg total) by mouth daily. 02/17/23  Yes   lisinopril  (ZESTRIL ) 20 MG tablet Take 1 tablet (20 mg total) by mouth daily. 01/31/22 06/30/22    simvastatin  (ZOCOR ) 40 MG tablet TAKE 1 TABLET BY MOUTH ONCE DAILY 12/30/19 12/29/20  Rae Bugler, MD    Family History Family History  Problem Relation Age of Onset   Cancer Mother    Hyperlipidemia Father    Heart disease Father    Diabetes Father    Heart disease Maternal Grandmother    Diabetes Paternal Grandmother    Stomach cancer Maternal Aunt    Colon cancer Neg Hx    Esophageal cancer Neg Hx    Colon polyps Neg Hx     Social History Social History   Tobacco Use   Smoking status: Never   Smokeless tobacco: Never  Vaping Use   Vaping status: Never Used  Substance Use Topics   Alcohol use: No   Drug use: No     Allergies   Patient has no known allergies.   Review of Systems Review of Systems See HPI  Physical Exam Triage Vital Signs ED Triage Vitals  Encounter Vitals Group     BP 08/31/23 1349 101/69     Systolic BP Percentile --       Diastolic BP Percentile --      Pulse Rate 08/31/23 1349 92     Resp 08/31/23 1349 18     Temp 08/31/23 1349 99.2 F (37.3 C)     Temp Source 08/31/23 1349 Oral     SpO2 08/31/23 1349 93 %     Weight 08/31/23 1351 (!) 310 lb (140.6 kg)     Height 08/31/23 1351 5\' 10"  (1.778 m)  Head Circumference --      Peak Flow --      Pain Score 08/31/23 1350 1     Pain Loc --      Pain Education --      Exclude from Growth Chart --    No data found.  Updated Vital Signs BP 101/69 (BP Location: Right Arm)   Pulse 92   Temp 99.2 F (37.3 C) (Oral)   Resp 18   Ht 5\' 10"  (1.778 m)   Wt (!) 140.6 kg   SpO2 93%   BMI 44.48 kg/m      Physical Exam Constitutional:      General: He is not in acute distress.    Appearance: He is well-developed. He is obese. He is not ill-appearing.  HENT:     Head: Normocephalic and atraumatic.  Eyes:     Conjunctiva/sclera: Conjunctivae normal.     Pupils: Pupils are equal, round, and reactive to light.  Cardiovascular:     Rate and Rhythm: Normal rate.  Pulmonary:     Effort: Pulmonary effort is normal. No respiratory distress.  Abdominal:     General: There is no distension.     Palpations: Abdomen is soft.  Musculoskeletal:        General: Normal range of motion.     Cervical back: Normal range of motion.  Skin:    General: Skin is warm and dry.     Comments: Increased redness and swelling of the left leg from about mid shin to the foot.  Warmth.  Mild tenderness.  No inguinal adenopathy  Neurological:     Mental Status: He is alert.      UC Treatments / Results  Labs (all labs ordered are listed, but only abnormal results are displayed) Labs Reviewed - No data to display  EKG   Radiology No results found.  Procedures Procedures (including critical care time)  Medications Ordered in UC Medications - No data to display  Initial Impression / Assessment and Plan / UC Course  I have reviewed the triage vital signs and the  nursing notes.  Pertinent labs & imaging results that were available during my care of the patient were reviewed by me and considered in my medical decision making (see chart for details).     Importance of antibiotics reviewed with patient.  With fever chills and cellulitis, he risks sepsis and diabetes complications.  Follow-up with PCP. Final Clinical Impressions(s) / UC Diagnoses   Final diagnoses:  Cellulitis of leg, left  Type 2 diabetes mellitus without complication, without long-term current use of insulin  [facility])   Discharge Instructions      Take the Duricef 2 times a day for a week Limit walking until improved May take Tylenol  as needed for pain Watch your blood sugars while fighting an infection See your primary care doctor in follow-up  ED Prescriptions     Medication Sig Dispense Auth. Provider   cefadroxil (DURICEF) 500 MG capsule Take 1 capsule (500 mg total) by mouth 2 (two) times daily. 14 capsule Stephany Ehrich, MD      PDMP not reviewed this encounter.   Stephany Ehrich, MD 08/31/23 [phone]

## 2023-08-31 NOTE — ED Triage Notes (Signed)
 Patient c/o left lower leg swelling and redness since yesterday.  Concern for possible cellulitis.  History of this.

## 2023-09-02 ENCOUNTER — Other Ambulatory Visit: Payer: Self-pay | Admitting: Internal Medicine

## 2023-09-02 ENCOUNTER — Other Ambulatory Visit (HOSPITAL_BASED_OUTPATIENT_CLINIC_OR_DEPARTMENT_OTHER): Payer: Self-pay

## 2023-09-02 DIAGNOSIS — E1165 Type 2 diabetes mellitus with hyperglycemia: Secondary | ICD-10-CM

## 2023-09-02 MED ORDER — TRESIBA FLEXTOUCH 200 UNIT/ML ~~LOC~~ SOPN
36.0000 [IU] | PEN_INJECTOR | Freq: Every day | SUBCUTANEOUS | 3 refills | Status: DC
  Filled 2023-09-02: qty 9, 50d supply, fill #0
  Filled 2023-10-27: qty 9, 50d supply, fill #1

## 2023-09-10 ENCOUNTER — Other Ambulatory Visit (HOSPITAL_BASED_OUTPATIENT_CLINIC_OR_DEPARTMENT_OTHER): Payer: Self-pay

## 2023-09-12 ENCOUNTER — Other Ambulatory Visit (HOSPITAL_BASED_OUTPATIENT_CLINIC_OR_DEPARTMENT_OTHER): Payer: Self-pay

## 2023-09-12 MED ORDER — SIMVASTATIN 40 MG PO TABS
40.0000 mg | ORAL_TABLET | Freq: Every day | ORAL | 0 refills | Status: DC
  Filled 2023-09-12: qty 90, 90d supply, fill #0

## 2023-09-15 ENCOUNTER — Other Ambulatory Visit: Payer: Self-pay

## 2023-09-15 ENCOUNTER — Other Ambulatory Visit (HOSPITAL_BASED_OUTPATIENT_CLINIC_OR_DEPARTMENT_OTHER): Payer: Self-pay

## 2023-09-15 MED ORDER — ICOSAPENT ETHYL 1 G PO CAPS
2.0000 g | ORAL_CAPSULE | Freq: Two times a day (BID) | ORAL | 0 refills | Status: DC
  Filled 2023-09-15: qty 120, 30d supply, fill #0

## 2023-09-16 ENCOUNTER — Other Ambulatory Visit (HOSPITAL_BASED_OUTPATIENT_CLINIC_OR_DEPARTMENT_OTHER): Payer: Self-pay

## 2023-09-16 DIAGNOSIS — H59813 Chorioretinal scars after surgery for detachment, bilateral: Secondary | ICD-10-CM | POA: Diagnosis not present

## 2023-09-16 DIAGNOSIS — H2513 Age-related nuclear cataract, bilateral: Secondary | ICD-10-CM | POA: Diagnosis not present

## 2023-09-16 DIAGNOSIS — H43823 Vitreomacular adhesion, bilateral: Secondary | ICD-10-CM | POA: Diagnosis not present

## 2023-09-16 DIAGNOSIS — E113513 Type 2 diabetes mellitus with proliferative diabetic retinopathy with macular edema, bilateral: Secondary | ICD-10-CM | POA: Diagnosis not present

## 2023-09-16 DIAGNOSIS — H35033 Hypertensive retinopathy, bilateral: Secondary | ICD-10-CM | POA: Diagnosis not present

## 2023-09-16 MED ORDER — LISINOPRIL 20 MG PO TABS
20.0000 mg | ORAL_TABLET | Freq: Every day | ORAL | 0 refills | Status: DC
  Filled 2023-09-16: qty 90, 90d supply, fill #0

## 2023-09-22 DIAGNOSIS — G4733 Obstructive sleep apnea (adult) (pediatric): Secondary | ICD-10-CM | POA: Diagnosis not present

## 2023-09-23 ENCOUNTER — Other Ambulatory Visit (HOSPITAL_BASED_OUTPATIENT_CLINIC_OR_DEPARTMENT_OTHER): Payer: Self-pay

## 2023-09-23 DIAGNOSIS — K219 Gastro-esophageal reflux disease without esophagitis: Secondary | ICD-10-CM | POA: Diagnosis not present

## 2023-09-23 DIAGNOSIS — N529 Male erectile dysfunction, unspecified: Secondary | ICD-10-CM | POA: Diagnosis not present

## 2023-09-23 DIAGNOSIS — E7800 Pure hypercholesterolemia, unspecified: Secondary | ICD-10-CM | POA: Diagnosis not present

## 2023-09-23 DIAGNOSIS — F320 Major depressive disorder, single episode, mild: Secondary | ICD-10-CM | POA: Diagnosis not present

## 2023-09-23 DIAGNOSIS — R944 Abnormal results of kidney function studies: Secondary | ICD-10-CM | POA: Diagnosis not present

## 2023-09-23 DIAGNOSIS — G4733 Obstructive sleep apnea (adult) (pediatric): Secondary | ICD-10-CM | POA: Diagnosis not present

## 2023-09-23 DIAGNOSIS — I10 Essential (primary) hypertension: Secondary | ICD-10-CM | POA: Diagnosis not present

## 2023-09-23 DIAGNOSIS — G629 Polyneuropathy, unspecified: Secondary | ICD-10-CM | POA: Diagnosis not present

## 2023-09-23 DIAGNOSIS — E1169 Type 2 diabetes mellitus with other specified complication: Secondary | ICD-10-CM | POA: Diagnosis not present

## 2023-09-23 MED ORDER — LISINOPRIL 10 MG PO TABS
10.0000 mg | ORAL_TABLET | Freq: Every day | ORAL | 1 refills | Status: AC
  Filled 2023-09-23: qty 90, 90d supply, fill #0
  Filled 2024-03-22: qty 90, 90d supply, fill #1

## 2023-09-23 MED ORDER — GABAPENTIN 600 MG PO TABS
900.0000 mg | ORAL_TABLET | Freq: Three times a day (TID) | ORAL | 1 refills | Status: AC
  Filled 2023-09-23 – 2023-10-15 (×2): qty 405, 90d supply, fill #0
  Filled 2024-02-12: qty 405, 90d supply, fill #1

## 2023-09-25 ENCOUNTER — Ambulatory Visit: Admitting: Internal Medicine

## 2023-09-29 ENCOUNTER — Other Ambulatory Visit: Payer: Self-pay | Admitting: Internal Medicine

## 2023-09-30 ENCOUNTER — Ambulatory Visit: Admitting: Internal Medicine

## 2023-09-30 ENCOUNTER — Other Ambulatory Visit (HOSPITAL_BASED_OUTPATIENT_CLINIC_OR_DEPARTMENT_OTHER): Payer: Self-pay

## 2023-09-30 MED ORDER — DEXCOM G7 SENSOR MISC
3.0000 | 4 refills | Status: AC
  Filled 2023-09-30: qty 9, 90d supply, fill #0

## 2023-10-13 ENCOUNTER — Other Ambulatory Visit (HOSPITAL_BASED_OUTPATIENT_CLINIC_OR_DEPARTMENT_OTHER): Payer: Self-pay

## 2023-10-14 ENCOUNTER — Other Ambulatory Visit (HOSPITAL_BASED_OUTPATIENT_CLINIC_OR_DEPARTMENT_OTHER): Payer: Self-pay

## 2023-10-14 MED ORDER — ICOSAPENT ETHYL 1 G PO CAPS
2.0000 g | ORAL_CAPSULE | Freq: Two times a day (BID) | ORAL | 1 refills | Status: DC
  Filled 2023-10-14: qty 120, 30d supply, fill #0
  Filled 2023-11-14: qty 120, 30d supply, fill #1

## 2023-10-15 ENCOUNTER — Other Ambulatory Visit (HOSPITAL_BASED_OUTPATIENT_CLINIC_OR_DEPARTMENT_OTHER): Payer: Self-pay

## 2023-10-22 DIAGNOSIS — G4733 Obstructive sleep apnea (adult) (pediatric): Secondary | ICD-10-CM | POA: Diagnosis not present

## 2023-10-27 ENCOUNTER — Other Ambulatory Visit (HOSPITAL_BASED_OUTPATIENT_CLINIC_OR_DEPARTMENT_OTHER): Payer: Self-pay

## 2023-10-30 ENCOUNTER — Ambulatory Visit: Admitting: Internal Medicine

## 2023-10-31 DIAGNOSIS — G4733 Obstructive sleep apnea (adult) (pediatric): Secondary | ICD-10-CM | POA: Diagnosis not present

## 2023-11-02 ENCOUNTER — Other Ambulatory Visit: Payer: Self-pay

## 2023-11-02 ENCOUNTER — Ambulatory Visit
Admission: RE | Admit: 2023-11-02 | Discharge: 2023-11-02 | Disposition: A | Discharge status: Home / Self Care | Source: Ambulatory Visit | Attending: Family Medicine | Admitting: Family Medicine

## 2023-11-02 VITALS — BP 116/77 | HR 73 | Temp 98.1°F | Resp 18 | Ht 70.0 in | Wt 300.0 lb

## 2023-11-02 DIAGNOSIS — L03116 Cellulitis of left lower limb: Secondary | ICD-10-CM | POA: Diagnosis not present

## 2023-11-02 MED ORDER — DOXYCYCLINE HYCLATE 100 MG PO CAPS: 100.0000 mg | ORAL_CAPSULE | Freq: Two times a day (BID) | ORAL | 0 refills | Status: AC

## 2023-11-02 NOTE — ED Triage Notes (Signed)
 Patient states left lower extremity swelling/redness and mild pain, history of cellulitis in that same leg.  History of ingrown toenails frequently on the left foot that he cuts out himself and wonders if this could be related.

## 2023-11-02 NOTE — Discharge Instructions (Addendum)
 Advised patient to take medication as directed with food to completion.  Encouraged to increase daily water intake to 64 ounces per day while taking this medication.  Advised if symptoms worsen and/or unresolved please follow-up with your PCP or here for further evaluation.

## 2023-11-02 NOTE — ED Provider Notes (Signed)
 Victor Estrada    CSN: [id number] Arrival date & time: 11/02/23  1200      History   Chief Complaint Chief Complaint  Patient presents with   Leg Pain    Symptoms of cellulitis which I've had several times. - Entered by patient    HPI Victor Estrada is a 53 y.o. male.   HPI 53 year old male presents with left lower extremity swelling redness and mild pain.  Reports history of cellulitis in same leg.  Reports history of ingrown toenails and frequently cuts his left foot and wonders if this could be related.  PMH significant for T2DM, peripheral neuropathy morbid obesity, and depression.  Past Medical History:  Diagnosis Date   Anxiety    Complication of anesthesia    Constipation    Depression    Diabetes mellitus    type 2   GERD (gastroesophageal reflux disease)    Head injury, acute, without loss of consciousness    History of kidney stones    Hyperlipidemia    Hypertension    Kidney stone    MDD (major depressive disorder)    Peripheral neuropathy    PONV (postoperative nausea and vomiting)    post wisdom teeth   Shortness of breath    with exertion   Sinus disorder    Sleep apnea     Patient Active Problem List   Diagnosis Date Noted   Class 3 obesity 01/28/2020   Poorly controlled type 2 diabetes mellitus with peripheral neuropathy (HCC) 06/26/2018   Hypersomnia 01/19/2016   OSA (obstructive sleep apnea) 11/03/2015   Abdominal wall strain 07/28/2014   URI (upper respiratory infection) 04/20/2014   Cellulitis and abscess of face 11/11/2013   GERD (gastroesophageal reflux disease) 05/28/2013   Allergic rhinitis 05/28/2013   Routine general medical examination at a health [facility] facility 05/28/2013   Adjustment disorder with mixed anxiety and depressed mood 01/27/2012   Sinusitis 12/20/2011   HTN (hypertension) 05/23/2011   Hyperlipidemia 05/23/2011   Morbid obesity (HCC) 05/23/2011    Past Surgical History:  Procedure Laterality Date    DENTAL SURGERY     INCISION AND DRAINAGE ABSCESS Left 11/12/2013   Procedure: INCISION AND DRAINAGE LEFT CHEEK  ABSCESS;  Surgeon: Vaughan Ricker, MD;  Location: [facility] OR;  Service: ENT;  Laterality: Left;   NASAL SEPTOPLASTY W/ TURBINOPLASTY N/A 04/26/2016   Procedure: NASAL SEPTOPLASTY WITH TURBINATE REDUCTION;  Surgeon: Vaughan Ricker, MD;  Location: [facility] OR;  Service: ENT;  Laterality: N/A;   WISDOM TOOTH EXTRACTION         Home Medications    Prior to Admission medications   Medication Sig Start Date End Date Taking? Authorizing Provider  doxycycline  (VIBRAMYCIN ) 100 MG capsule Take 1 capsule (100 mg total) by mouth 2 (two) times daily for 10 days. 11/02/23 11/12/23 Yes Teddy Sharper, FNP  acetaminophen  (TYLENOL ) 500 MG tablet Take 1,000 mg by mouth every 6 (six) hours as needed (for pain).     [provider]  Continuous Glucose Sensor (DEXCOM G7 SENSOR) MISC Apply 1 sensor every 10 days as directed 09/30/23   Trixie File, MD  empagliflozin  (JARDIANCE ) 25 MG TABS tablet Take 1 tablet (25 mg total) by mouth daily before breakfast. 08/19/23   Trixie File, MD  gabapentin  (NEURONTIN ) 600 MG tablet Take 1.5 tablets (900 mg total) by mouth every 8 (eight) hours. 12/28/20   Penumalli, Vikram R, MD  gabapentin  (NEURONTIN ) 600 MG tablet Take 1 & 1/2 tablets by mouth every  8 hours 08/03/21     gabapentin  (NEURONTIN ) 600 MG tablet Take 1.5 tablets (900 mg total) by mouth every 8 (eight) hours. 08/01/22     gabapentin  (NEURONTIN ) 600 MG tablet Take 1.5 tablets (900 mg total) by mouth every 8 (eight) hours. 08/20/23 11/18/23    gabapentin  (NEURONTIN ) 600 MG tablet Take 1.5 tablets (900 mg total) by mouth every 8 (eight) hours. 09/23/23     ibuprofen (ADVIL) 200 MG tablet Take 200 mg by mouth every 6 (six) hours as needed.    [provider]  icosapent  Ethyl (VASCEPA ) 1 g capsule Take 2 capsules (2 g total) by mouth 2 (two) times daily. With meals 09/03/22     icosapent  Ethyl (VASCEPA ) 1 g  capsule Take 2 capsules (2 g total) by mouth 2 (two) times daily. 10/14/23     insulin  degludec (TRESIBA  FLEXTOUCH) 200 UNIT/ML FlexTouch Pen Inject up to 36 Units into the skin daily. 09/02/23   Trixie File, MD  Insulin  Pen Needle (TECHLITE PLUS PEN NEEDLES) 32G X 4 MM MISC Use once a day as directed. 03/03/23 03/02/24  Trixie File, MD  lisinopril  (ZESTRIL ) 10 MG tablet Take 1 tablet (10 mg total) by mouth daily. 09/23/23     lisinopril  (ZESTRIL ) 20 MG tablet Take 1 tablet (20 mg total) by mouth daily. 01/31/22 06/30/22    lisinopril  (ZESTRIL ) 20 MG tablet Take 1 tablet (20 mg total) by mouth daily. 09/16/23     metFORMIN  (GLUCOPHAGE ) 1000 MG tablet Take 2 tablets (2,000 mg total) by mouth at bedtime. 03/24/23 12/19/23  Trixie File, MD  Multiple Vitamin (MULTIVITAMIN) tablet Take 1 tablet by mouth daily.    [provider]  pantoprazole  (PROTONIX ) 40 MG tablet Take 1 tablet (40 mg total) by mouth daily. 08/21/23     simvastatin  (ZOCOR ) 40 MG tablet TAKE 1 TABLET BY MOUTH ONCE DAILY 12/30/19 12/29/20  Seabron Lenis, MD  simvastatin  (ZOCOR ) 40 MG tablet Take 1 tablet (40 mg total) by mouth daily. 09/12/23       Family History Family History  Problem Relation Age of Onset   Cancer Mother    Hyperlipidemia Father    Heart disease Father    Diabetes Father    Heart disease Maternal Grandmother    Diabetes Paternal Grandmother    Stomach cancer Maternal Aunt    Colon cancer Neg Hx    Esophageal cancer Neg Hx    Colon polyps Neg Hx     Social History Social History   Tobacco Use   Smoking status: Never   Smokeless tobacco: Never  Vaping Use   Vaping status: Never Used  Substance Use Topics   Alcohol use: No   Drug use: No     Allergies   Patient has no known allergies.   Review of Systems Review of Systems  All other systems reviewed and are negative.    Physical Exam Triage Vital Signs ED Triage Vitals  Encounter Vitals Group     BP 11/02/23 1236 116/77      Girls Systolic BP Percentile --      Girls Diastolic BP Percentile --      Boys Systolic BP Percentile --      Boys Diastolic BP Percentile --      Pulse Rate 11/02/23 1236 73     Resp 11/02/23 1236 18     Temp 11/02/23 1236 98.1 F (36.7 C)     Temp Source 11/02/23 1236 Oral     SpO2 11/02/23  1236 94 %     Weight 11/02/23 1233 300 lb (136.1 kg)     Height 11/02/23 1233 5' 10 (1.778 m)     Head Circumference --      Peak Flow --      Pain Score 11/02/23 1233 1     Pain Loc --      Pain Education --      Exclude from Growth Chart --    No data found.  Updated Vital Signs BP 116/77 (BP Location: Right Arm)   Pulse 73   Temp 98.1 F (36.7 C) (Oral)   Resp 18   Ht 5' 10 (1.778 m)   Wt 300 lb (136.1 kg)   SpO2 94%   BMI 43.05 kg/m   Visual Acuity Right Eye Distance:   Left Eye Distance:   Bilateral Distance:    Right Eye Near:   Left Eye Near:    Bilateral Near:     Physical Exam Vitals and nursing note reviewed.  Constitutional:      Appearance: Normal appearance. He is obese.  HENT:     Head: Normocephalic and atraumatic.     Nose: Nose normal.     Mouth/Throat:     Mouth: Mucous membranes are moist.     Pharynx: Oropharynx is clear.  Eyes:     Extraocular Movements: Extraocular movements intact.     Pupils: Pupils are equal, round, and reactive to light.  Cardiovascular:     Rate and Rhythm: Normal rate and regular rhythm.     Pulses: Normal pulses.     Heart sounds: Normal heart sounds.  Pulmonary:     Effort: Pulmonary effort is normal.     Breath sounds: Normal breath sounds. No wheezing, rhonchi or rales.  Musculoskeletal:        General: Normal range of motion.     Cervical back: Normal range of motion and neck supple.  Skin:    General: Skin is warm and dry.     Comments: Left lower leg (inferior aspect): Erythematous with moderate soft tissue swelling noted  Neurological:     General: No focal deficit present.     Mental Status: He is  alert and oriented to person, place, and time. Mental status is at baseline.  Psychiatric:        Mood and Affect: Mood normal.        Behavior: Behavior normal.        Thought Content: Thought content normal.      UC Treatments / Results  Labs (all labs ordered are listed, but only abnormal results are displayed) Labs Reviewed - No data to display  EKG   Radiology No results found.  Procedures Procedures (including critical Estrada time)  Medications Ordered in UC Medications - No data to display  Initial Impression / Assessment and Plan / UC Course  I have reviewed the triage vital signs and the nursing notes.  Pertinent labs & imaging results that were available during my E[facility]ada of the patient were reviewed by me and considered in my medical decision making (see chart for details).     MDM: 1.  Cellulitis of leg, left-Rx'd doxycycline  100 mg capsule: Take 1 capsule twice daily x 10 days. Advised patient to take medication as directed with food to completion.  Encouraged to increase daily water intake to 64 ounces per day while taking this medication.  Advised if symptoms worsen and/or unresolved please follow-up with your PCP or here for  further evaluation.  Patient discharged home, hemodynamically stable. Final Clinical Impressions(s) / UC Diagnoses   Final diagnoses:  Cellulitis of leg, left     Discharge Instructions      Advised patient to take medication as directed with food to completion.  Encouraged to increase daily water intake to 64 ounces per day while taking this medication.  Advised if symptoms worsen and/or unresolved please follow-up with your PCP or here for further evaluation.     ED Prescriptions     Medication Sig Dispense Auth. Provider   doxycycline  (VIBRAMYCIN ) 100 MG capsule Take 1 capsule (100 mg total) by mouth 2 (two) times daily for 10 days. 20 capsule Arina Torry, FNP      PDMP not reviewed this encounter.   Teddy Sharper,  FNP 11/02/23 1333

## 2023-11-20 ENCOUNTER — Other Ambulatory Visit (HOSPITAL_BASED_OUTPATIENT_CLINIC_OR_DEPARTMENT_OTHER): Payer: Self-pay

## 2023-11-20 ENCOUNTER — Ambulatory Visit: Admitting: Internal Medicine

## 2023-11-20 ENCOUNTER — Encounter: Payer: Self-pay | Admitting: Internal Medicine

## 2023-11-20 DIAGNOSIS — E1165 Type 2 diabetes mellitus with hyperglycemia: Secondary | ICD-10-CM | POA: Diagnosis not present

## 2023-11-20 DIAGNOSIS — Z7984 Long term (current) use of oral hypoglycemic drugs: Secondary | ICD-10-CM

## 2023-11-20 DIAGNOSIS — Z7985 Long-term (current) use of injectable non-insulin antidiabetic drugs: Secondary | ICD-10-CM

## 2023-11-20 DIAGNOSIS — E1142 Type 2 diabetes mellitus with diabetic polyneuropathy: Secondary | ICD-10-CM

## 2023-11-20 LAB — POCT GLYCOSYLATED HEMOGLOBIN (HGB A1C): Hemoglobin A1C: 8.4 % — AB (ref 4.0–5.6)

## 2023-11-20 MED ORDER — METFORMIN HCL 1000 MG PO TABS
1000.0000 mg | ORAL_TABLET | Freq: Two times a day (BID) | ORAL | 3 refills | Status: AC
  Filled 2023-11-20 – 2023-12-01 (×3): qty 180, 90d supply, fill #0
  Filled 2024-02-25: qty 180, 90d supply, fill #1

## 2023-11-20 MED ORDER — TRESIBA FLEXTOUCH 200 UNIT/ML ~~LOC~~ SOPN: 40.0000 [IU] | PEN_INJECTOR | Freq: Every day | SUBCUTANEOUS | Status: DC

## 2023-11-20 NOTE — Patient Instructions (Addendum)
 Please continue: - Metformin  1000 mg 2x a day - Jardiance  25 mg before b'fast  Increase: - Tresiba  40-42 units daily  Stop snacking at bedtime.  Please return in 3-4 months.

## 2023-11-20 NOTE — Progress Notes (Signed)
 Patient ID: Victor Estrada, male   DOB: 03/25/1971, 53 y.o.   MRN: [id number]   HPI: Victor Estrada is a 53 y.o.-year-old male, initially referred by his previous PCP, Dr. Seabron, returning for follow-up for DM2, dx in 2005, non-insulin -dependent, uncontrolled, with long-term complications (PN, DR). His wife is also my pt: Victor Estrada. He saw Dr. Kassie in 2013.  Last visit with me 8 months ago.  Interim history: No increased urination, blurry vision, nausea, chest pain. Since last visit, he restarted his CPAP and feeling better on this. He had several episodes of leg cellulitis since last visit, treated with antibiotics (doxycycline ) Approximately a month ago he reattached his Dexcom and started to improve his diet and activity. He is usually going to bed late, around 2-3 AM and wakes up around noon.  Reviewed latest HbA1c level: Lab Results  Component Value Date   HGBA1C 8.4 (A) 02/27/2023   HGBA1C 7.8 (A) 03/06/2022   HGBA1C 10.5 12/30/2019   HGBA1C 9.8 04/13/2019   HGBA1C 11.1 (H) 11/03/2015   HGBA1C 10.5 (H) 02/06/2015   HGBA1C 6.6 (H) 09/14/2013   HGBA1C 6.8 (H) 05/28/2013   HGBA1C 10.5 (H) 01/27/2012   HGBA1C 9.9 (H) 10/01/2011  07/31/2022: HbA1c 8.7% 08/03/2021: HbA1c 9.7% 01/16/2021: HbA1c 7.7% 06/30/2020: HbA1c 7.7% 05/19/2018: HbA1c 11% 04/01/2017: HbA1c 11.2%  Previously on: - Janumet  50-1000 mg 2x a day, with meals - Jardiance  10 mg before breakfast He tried Glimepiride  in the past.  Now on: - Metformin  1000 mg 2x a day with meals >> 2000 mg daily at night (bedtime) >> 1000 mg with supper, 1000 mg at bedtime >> 2000 units in the morning - Jardiance  10 >> 25 mg before b'fast - Tresiba  14 units (started 04/2020) >> 32 >> 40 >> 36 units daily He tried Trulicity  0.75 >> 1.5 >> 3 mg weekly >> AP >> 1.5 mg  >> AP  He checks sugars >4x with the Dexcom CGM:   Previously:  Previously:   Lowest sugar was 88, 111 >> 95 >>  112 >> 100; he has hypoglycemia awareness in the  90s. Highest sugar was 350 ... <300 >> 300 >> 300s.  Glucometer: One Touch ultra >> Freestyle  Pt's meals are: - Breakfast: skips >> old-fashioned oatmeal with fruit - Lunch: lean cuisine, fast food - Dinner: same - Snacks: protein bars, PB crackers, fruit Stopped regular sodas.  -+ Mild CKD, last BUN/creatinine:  09/23/2023: 36/1.51, GFR 55, glucose 118 Lab Results  Component Value Date   BUN 23 (H) 11/09/2018   BUN 19 04/23/2016   CREATININE 1.09 11/09/2018   CREATININE 1.29 (H) 04/23/2016   Lab Results  Component Value Date   MICRALBCREAT 88 (H) 02/27/2023  On lisinopril  20.  -+ HL; last set of lipids: 06/12/2023: 188/108/63/106 Lab Results  Component Value Date   CHOL 151 11/03/2015   HDL 37 (L) 11/03/2015   LDLCALC 48 11/03/2015   LDLDIRECT 92.0 02/06/2015   TRIG 330 (H) 11/03/2015   CHOLHDL 4.1 11/03/2015  On Zocor  40, Vascepa  added 04/2018.  - last eye exam was in 2024: + DR reportedly. Sees the retina specialist.  -+ Numbness and tingling in his feet.  On Neurontin  600 >> 900 mg 2x >> 3x a day.  Last foot exam 02/27/2023.  Pt has FH of DM in father.  She also has a history of HTN, GERD, OSA, obesity class III, kidney stones.  He has a family history of bile duct cancer in mother.  ROS: +  see HPI  I reviewed pt's medications, allergies, PMH, social hx, family hx, and changes were documented in the history of present illness. Otherwise, unchanged from my initial visit note.  Past Medical History:  Diagnosis Date   Anxiety    Complication of anesthesia    Constipation    Depression    Diabetes mellitus    type 2   GERD (gastroesophageal reflux disease)    Head injury, acute, without loss of consciousness    History of kidney stones    Hyperlipidemia    Hypertension    Kidney stone    MDD (major depressive disorder)    Peripheral neuropathy    PONV (postoperative nausea and vomiting)    post wisdom teeth   Shortness of breath    with exertion    Sinus disorder    Sleep apnea    Past Surgical History:  Procedure Laterality Date   DENTAL SURGERY     INCISION AND DRAINAGE ABSCESS Left 11/12/2013   Procedure: INCISION AND DRAINAGE LEFT CHEEK  ABSCESS;  Surgeon: Vaughan Ricker, MD;  Location[facility] OR;  Service: ENT;  Laterality: Left;   NASAL SEPTOPLASTY W/ TURBINOPLASTY N/A 04/26/2016   Procedure: NASAL SEPTOPLASTY WITH TURBINATE REDUCTION;  Surgeon: Vaughan Ricker, MD;  Location: [facility] OR;  Service: ENT;  Laterality: N/A;   WISDOM TOOTH EXTRACTION     Social History   Socioeconomic History   Marital status: Married  Occupational History  Social Network engineer strain: Not on file   Food insecurity:    Worry: Not on file    Inability: Not on file   Transportation needs:    Medical: Not on file    Non-medical: Not on file  Tobacco Use   Smoking status: Never Smoker   Smokeless tobacco: Never Used  Substance and Sexual Activity   Alcohol use: No   Drug use: No   Sexual activity: Not on file  Lifestyle   Physical activity:    Days per week: Not on file    Minutes per session: Not on file   Stress: Not on file  Relationships   Social connections:    Talks on phone: Not on file    Gets together: Not on file    Attends religious service: Not on file    Active member of club or organization: Not on file    Attends meetings of clubs or organizations: Not on file    Relationship status: Not on file   Intimate partner violence:    Fear of current or ex partner: Not on file    Emotionally abused: Not on file    Physically abused: Not on file    Forced sexual activity: Not on file  Other Topics Concern   Not on file  Social History Narrative   Married 1 son   Quarry manager   2-3 caffeine drinks/day   Current Outpatient Medications on File Prior to Visit  Medication Sig Dispense Refill   acetaminophen  (TYLENOL ) 500 MG tablet Take 1,000 mg by mouth every 6 (six) hours as needed (for pain).      Continuous  Glucose Sensor (DEXCOM G7 SENSOR) MISC Apply 1 sensor every 10 days as directed 9 each 4   empagliflozin  (JARDIANCE ) 25 MG TABS tablet Take 1 tablet (25 mg total) by mouth daily before breakfast. 90 tablet 3   gabapentin  (NEURONTIN ) 600 MG tablet Take 1.5 tablets (900 mg total) by mouth every 8 (eight) hours. 135 tablet 2  gabapentin  (NEURONTIN ) 600 MG tablet Take 1 & 1/2 tablets by mouth every 8 hours 405 tablet 1   gabapentin  (NEURONTIN ) 600 MG tablet Take 1.5 tablets (900 mg total) by mouth every 8 (eight) hours. 405 tablet 1   gabapentin  (NEURONTIN ) 600 MG tablet Take 1.5 tablets (900 mg total) by mouth every 8 (eight) hours. 135 tablet 2   gabapentin  (NEURONTIN ) 600 MG tablet Take 1.5 tablets (900 mg total) by mouth every 8 (eight) hours. 405 tablet 1   ibuprofen (ADVIL) 200 MG tablet Take 200 mg by mouth every 6 (six) hours as needed.     icosapent  Ethyl (VASCEPA ) 1 g capsule Take 2 capsules (2 g total) by mouth 2 (two) times daily. With meals 120 capsule 6   icosapent  Ethyl (VASCEPA ) 1 g capsule Take 2 capsules (2 g total) by mouth 2 (two) times daily. 120 capsule 1   insulin  degludec (TRESIBA  FLEXTOUCH) 200 UNIT/ML FlexTouch Pen Inject up to 36 Units into the skin daily. 9 mL 3   Insulin  Pen Needle (TECHLITE PLUS PEN NEEDLES) 32G X 4 MM MISC Use once a day as directed. 100 each 3   lisinopril  (ZESTRIL ) 10 MG tablet Take 1 tablet (10 mg total) by mouth daily. 90 tablet 1   lisinopril  (ZESTRIL ) 20 MG tablet Take 1 tablet (20 mg total) by mouth daily. 30 tablet 2   lisinopril  (ZESTRIL ) 20 MG tablet Take 1 tablet (20 mg total) by mouth daily. 90 tablet 0   metFORMIN  (GLUCOPHAGE ) 1000 MG tablet Take 2 tablets (2,000 mg total) by mouth at bedtime. 180 tablet 3   Multiple Vitamin (MULTIVITAMIN) tablet Take 1 tablet by mouth daily.     pantoprazole  (PROTONIX ) 40 MG tablet Take 1 tablet (40 mg total) by mouth daily. 90 tablet 0   simvastatin  (ZOCOR ) 40 MG tablet TAKE 1 TABLET BY MOUTH ONCE DAILY 90  tablet 1   simvastatin  (ZOCOR ) 40 MG tablet Take 1 tablet (40 mg total) by mouth daily. 90 tablet 0   No current facility-administered medications on file prior to visit.   No Known Allergies Family History  Problem Relation Age of Onset   Cancer Mother    Hyperlipidemia Father    Heart disease Father    Diabetes Father    Heart disease Maternal Grandmother    Diabetes Paternal Grandmother    Stomach cancer Maternal Aunt    Colon cancer Neg Hx    Esophageal cancer Neg Hx    Colon polyps Neg Hx    PE: BP 110/60   Pulse 84   Ht 5' 10 (1.778 m)   Wt (!) 305 lb 9.6 oz (138.6 kg)   SpO2 97%   BMI 43.85 kg/m  Wt Readings from Last 3 Encounters:  11/20/23 (!) 305 lb 9.6 oz (138.6 kg)  11/02/23 300 lb (136.1 kg)  08/31/23 (!) 310 lb (140.6 kg)   Constitutional: overweight, in NAD Eyes:  EOMI, no exophthalmos ENT: no neck masses, no cervical lymphadenopathy Cardiovascular: RRR, No MRG Respiratory: CTA B Musculoskeletal: no deformities Skin:no rashes Neurological: no tremor with outstretched hands Diabetic Foot Exam - Simple   Simple Foot Form Diabetic Foot exam was performed with the following findings: Yes 11/20/2023  3:51 PM  Visual Inspection No deformities, no ulcerations, no other skin breakdown bilaterally: Yes Sensation Testing Intact to touch and monofilament testing bilaterally: Yes Pulse Check Posterior Tibialis and Dorsalis pulse intact bilaterally: Yes Comments + B pitting edema Ingrown toenails    ASSESSMENT: 1. DM2,  insulin -dependent, uncontrolled, with long-term complications -PN -Mild CKD  2. HL  3. Obesity class III  PLAN:  1. Patient with longstanding, uncontrolled, type 2 diabetes, on oral antidiabetic regimen with metformin  and SGLT2 inhibitor along with long-acting insulin , with still poor control.  At last visit HbA1c was 8.4%, still elevated, only slightly improved. - He could not tolerate Trulicity  at different doses due to abdominal  pain. - At last visit, we discussed about improving the stopping snacking at bedtime, but also eating a salad before meals, including more nonstarchy vegetables, adding protein and healthy fats (chia seeds, flaxseeds, unsalted nuts) to his oatmeal, since he did see hyperglycemic peaks after this usually.  We also discussed about possibly moving metformin  in the morning - He now returns after longer absence, of 8 months. CGM interpretations: -At today's visit, we reviewed his CGM downloads: It appears that 72% of values are in target range (goal >70%), while 28% are higher than 180 (goal <25%), and 0% are lower than 70 (goal <4%).  The calculated average blood sugar is 172.  The projected HbA1c for the next 3 months (GMI) is 7.4%. -Reviewing the CGM trends, sugars appear to be significantly improved in the last 2 weeks, compared to the previous 2 weeks, when he readvanced the sensor.  With the sensor information, he started to adjust his diet and also to increase his activity, going more frequently for walks with his wife.  The sugars fluctuate within a narrow range but in the upper half of the target interval, with the majority of the blood sugars between 150 and 180 between meals and overnight.  Sugars do increase mostly after his 1 PM meal, and also occasionally after dinner and a snack at night.  We discussed about ideally stopping the snack at night but he is going to bed quite late so I advised him to at least separated by 4 hours from bedtime.  For now, I also recommended to increase his Tresiba  dose to bring all the blood sugars lower in the target range.  For now, we can continue the rest of the regimen. -He  describes mild penile yeast infections with Jardiance  but he is using over-the-counter antifungal creams with good success. - I suggested to:  Patient Instructions  Please continue: - Metformin  1000 mg 2x a day - Jardiance  25 mg before b'fast  Increase: - Tresiba  40-42 units daily  Stop  snacking at bedtime.  Please return in 3-4 months.  - we checked his HbA1c: 8.4% (stable) - advised to check sugars at different times of the day - 4x a day, rotating check times - advised for yearly eye exams >> he is UTD -At last visit, his microalbumin to creatinine ratio was 88, elevated. Since her also had a low GFR, I did suggest a referral to nephrology and advised him to discuss with his PCP about this.  At today's visit, we will go ahead and repeat this, since he did not see a nephrologist yet. - return to clinic in 3-4 months  2. HL - Reviewed latest lipid panel from 06/12/2023: 188/108/63/106 -LDL still above target - He is on Zocor  40 mg daily and Vascepa  2 g twice a day-no side effects  3. Obesity class III - Will continue Jardiance  which should also help with weight loss. - We retried Trulicity  but he could not tolerate this due to abdominal pain - Weight was approximately stable at last visit.  I advised him to stop snacking at bedtime.  Lela Fendt, MD PhD [facility]

## 2023-11-21 ENCOUNTER — Ambulatory Visit: Payer: Self-pay | Admitting: Internal Medicine

## 2023-11-21 ENCOUNTER — Other Ambulatory Visit (HOSPITAL_BASED_OUTPATIENT_CLINIC_OR_DEPARTMENT_OTHER): Payer: Self-pay

## 2023-11-21 LAB — MICROALBUMIN / CREATININE URINE RATIO
Creatinine, Urine: 56 mg/dL (ref 20–320)
Microalb Creat Ratio: 5 mg/g{creat} (ref <30)
Microalb, Ur: 0.3 mg/dL

## 2023-11-21 MED ORDER — PANTOPRAZOLE SODIUM 40 MG PO TBEC
40.0000 mg | DELAYED_RELEASE_TABLET | Freq: Every day | ORAL | 0 refills | Status: DC
  Filled 2023-11-21: qty 90, 90d supply, fill #0

## 2023-11-21 NOTE — Addendum Note (Signed)
 Addended by: CLEOTILDE ROLIN RAMAN on: 11/21/2023 11:21 AM   Modules accepted: Orders

## 2023-11-22 DIAGNOSIS — G4733 Obstructive sleep apnea (adult) (pediatric): Secondary | ICD-10-CM | POA: Diagnosis not present

## 2023-12-01 ENCOUNTER — Other Ambulatory Visit (HOSPITAL_BASED_OUTPATIENT_CLINIC_OR_DEPARTMENT_OTHER): Payer: Self-pay

## 2023-12-09 ENCOUNTER — Other Ambulatory Visit (HOSPITAL_BASED_OUTPATIENT_CLINIC_OR_DEPARTMENT_OTHER): Payer: Self-pay

## 2023-12-10 ENCOUNTER — Other Ambulatory Visit (HOSPITAL_BASED_OUTPATIENT_CLINIC_OR_DEPARTMENT_OTHER): Payer: Self-pay

## 2023-12-10 MED ORDER — SIMVASTATIN 40 MG PO TABS
40.0000 mg | ORAL_TABLET | Freq: Every day | ORAL | 1 refills | Status: AC
  Filled 2023-12-10: qty 90, 90d supply, fill #0
  Filled 2024-03-08: qty 90, 90d supply, fill #1

## 2023-12-12 ENCOUNTER — Other Ambulatory Visit (HOSPITAL_BASED_OUTPATIENT_CLINIC_OR_DEPARTMENT_OTHER): Payer: Self-pay

## 2023-12-12 MED ORDER — LISINOPRIL 10 MG PO TABS
10.0000 mg | ORAL_TABLET | Freq: Every day | ORAL | 0 refills | Status: AC
  Filled 2023-12-12: qty 90, 90d supply, fill #0

## 2023-12-15 ENCOUNTER — Other Ambulatory Visit (HOSPITAL_BASED_OUTPATIENT_CLINIC_OR_DEPARTMENT_OTHER): Payer: Self-pay

## 2023-12-16 ENCOUNTER — Other Ambulatory Visit (HOSPITAL_BASED_OUTPATIENT_CLINIC_OR_DEPARTMENT_OTHER): Payer: Self-pay

## 2023-12-16 MED ORDER — ICOSAPENT ETHYL 1 G PO CAPS
2.0000 g | ORAL_CAPSULE | Freq: Two times a day (BID) | ORAL | 1 refills | Status: DC
  Filled 2023-12-16: qty 120, 30d supply, fill #0
  Filled 2024-01-19 – 2024-01-29 (×2): qty 120, 30d supply, fill #1

## 2024-01-01 ENCOUNTER — Other Ambulatory Visit (HOSPITAL_BASED_OUTPATIENT_CLINIC_OR_DEPARTMENT_OTHER): Payer: Self-pay

## 2024-01-01 ENCOUNTER — Other Ambulatory Visit: Payer: Self-pay

## 2024-01-01 ENCOUNTER — Telehealth: Payer: Self-pay | Admitting: Internal Medicine

## 2024-01-01 DIAGNOSIS — E1142 Type 2 diabetes mellitus with diabetic polyneuropathy: Secondary | ICD-10-CM

## 2024-01-01 MED ORDER — TRESIBA FLEXTOUCH 200 UNIT/ML ~~LOC~~ SOPN
40.0000 [IU] | PEN_INJECTOR | Freq: Every day | SUBCUTANEOUS | 2 refills | Status: AC
  Filled 2024-01-01: qty 9, 42d supply, fill #0
  Filled 2024-02-12: qty 9, 42d supply, fill #1
  Filled 2024-03-30: qty 9, 42d supply, fill #2

## 2024-01-01 NOTE — Telephone Encounter (Signed)
 Requested Prescriptions   Signed Prescriptions Disp Refills   insulin  degludec (TRESIBA  FLEXTOUCH) 200 UNIT/ML FlexTouch Pen 27 mL 2    Sig: Inject 40-42 Units into the skin daily.    Authorizing Provider: TRIXIE FILE    Ordering User: CLEOTILDE REMAK S   Refill has been sent.

## 2024-01-01 NOTE — Telephone Encounter (Signed)
 Patient called the [facility] at [facility] - Patient attempted to fill RX for Tresiba  (Insulin  Degludec). Pharmacy advised that the medicine was discontinued.  Patient unsure of what this means, Pharmacy was going to fax something to the office with regards to this , but since he is but he is completely out of insulin  patient called as well  Call back # [phone]

## 2024-01-22 DIAGNOSIS — G4733 Obstructive sleep apnea (adult) (pediatric): Secondary | ICD-10-CM | POA: Diagnosis not present

## 2024-01-29 ENCOUNTER — Other Ambulatory Visit (HOSPITAL_BASED_OUTPATIENT_CLINIC_OR_DEPARTMENT_OTHER): Payer: Self-pay

## 2024-02-13 ENCOUNTER — Telehealth: Admitting: Family Medicine

## 2024-02-13 ENCOUNTER — Other Ambulatory Visit (HOSPITAL_BASED_OUTPATIENT_CLINIC_OR_DEPARTMENT_OTHER): Payer: Self-pay

## 2024-02-13 DIAGNOSIS — J069 Acute upper respiratory infection, unspecified: Secondary | ICD-10-CM

## 2024-02-13 MED ORDER — FLUTICASONE PROPIONATE 50 MCG/ACT NA SUSP
2.0000 | Freq: Every day | NASAL | 6 refills | Status: AC
  Filled 2024-02-13: qty 16, 30d supply, fill #0

## 2024-02-13 MED ORDER — BENZONATATE 200 MG PO CAPS
200.0000 mg | ORAL_CAPSULE | Freq: Two times a day (BID) | ORAL | 0 refills | Status: AC | PRN
  Filled 2024-02-13: qty 20, 10d supply, fill #0

## 2024-02-13 NOTE — Progress Notes (Signed)

## 2024-02-16 ENCOUNTER — Other Ambulatory Visit (HOSPITAL_BASED_OUTPATIENT_CLINIC_OR_DEPARTMENT_OTHER): Payer: Self-pay

## 2024-02-16 ENCOUNTER — Other Ambulatory Visit: Payer: Self-pay

## 2024-02-20 ENCOUNTER — Other Ambulatory Visit (HOSPITAL_BASED_OUTPATIENT_CLINIC_OR_DEPARTMENT_OTHER): Payer: Self-pay

## 2024-02-23 ENCOUNTER — Other Ambulatory Visit: Payer: Self-pay

## 2024-02-23 ENCOUNTER — Other Ambulatory Visit (HOSPITAL_BASED_OUTPATIENT_CLINIC_OR_DEPARTMENT_OTHER): Payer: Self-pay

## 2024-02-23 MED ORDER — ICOSAPENT ETHYL 1 G PO CAPS
2.0000 g | ORAL_CAPSULE | Freq: Two times a day (BID) | ORAL | 0 refills | Status: DC
  Filled 2024-02-23: qty 120, 30d supply, fill #0

## 2024-02-25 ENCOUNTER — Other Ambulatory Visit (HOSPITAL_BASED_OUTPATIENT_CLINIC_OR_DEPARTMENT_OTHER): Payer: Self-pay

## 2024-02-25 ENCOUNTER — Other Ambulatory Visit: Payer: Self-pay

## 2024-02-26 ENCOUNTER — Other Ambulatory Visit (HOSPITAL_BASED_OUTPATIENT_CLINIC_OR_DEPARTMENT_OTHER): Payer: Self-pay

## 2024-02-26 MED ORDER — PANTOPRAZOLE SODIUM 40 MG PO TBEC
40.0000 mg | DELAYED_RELEASE_TABLET | Freq: Every day | ORAL | 0 refills | Status: AC
  Filled 2024-02-26: qty 90, 90d supply, fill #0

## 2024-03-01 ENCOUNTER — Other Ambulatory Visit: Payer: Self-pay

## 2024-03-01 DIAGNOSIS — H59813 Chorioretinal scars after surgery for detachment, bilateral: Secondary | ICD-10-CM | POA: Diagnosis not present

## 2024-03-01 DIAGNOSIS — E113513 Type 2 diabetes mellitus with proliferative diabetic retinopathy with macular edema, bilateral: Secondary | ICD-10-CM | POA: Diagnosis not present

## 2024-03-01 DIAGNOSIS — H43823 Vitreomacular adhesion, bilateral: Secondary | ICD-10-CM | POA: Diagnosis not present

## 2024-03-01 DIAGNOSIS — H2513 Age-related nuclear cataract, bilateral: Secondary | ICD-10-CM | POA: Diagnosis not present

## 2024-03-01 DIAGNOSIS — H35033 Hypertensive retinopathy, bilateral: Secondary | ICD-10-CM | POA: Diagnosis not present

## 2024-03-03 DIAGNOSIS — G4733 Obstructive sleep apnea (adult) (pediatric): Secondary | ICD-10-CM | POA: Diagnosis not present

## 2024-03-03 DIAGNOSIS — G4734 Idiopathic sleep related nonobstructive alveolar hypoventilation: Secondary | ICD-10-CM | POA: Diagnosis not present

## 2024-03-12 ENCOUNTER — Telehealth: Admitting: Physician Assistant

## 2024-03-12 ENCOUNTER — Other Ambulatory Visit (HOSPITAL_BASED_OUTPATIENT_CLINIC_OR_DEPARTMENT_OTHER): Payer: Self-pay

## 2024-03-12 DIAGNOSIS — J039 Acute tonsillitis, unspecified: Secondary | ICD-10-CM | POA: Diagnosis not present

## 2024-03-12 MED ORDER — AMOXICILLIN 500 MG PO CAPS
500.0000 mg | ORAL_CAPSULE | Freq: Two times a day (BID) | ORAL | 0 refills | Status: AC
  Filled 2024-03-12: qty 20, 10d supply, fill #0

## 2024-03-12 NOTE — Progress Notes (Signed)

## 2024-03-22 ENCOUNTER — Other Ambulatory Visit (HOSPITAL_BASED_OUTPATIENT_CLINIC_OR_DEPARTMENT_OTHER): Payer: Self-pay

## 2024-03-22 ENCOUNTER — Other Ambulatory Visit: Payer: Self-pay

## 2024-03-22 MED ORDER — ICOSAPENT ETHYL 1 G PO CAPS
2.0000 g | ORAL_CAPSULE | Freq: Two times a day (BID) | ORAL | 0 refills | Status: DC
  Filled 2024-03-22: qty 120, 30d supply, fill #0

## 2024-03-30 ENCOUNTER — Other Ambulatory Visit (HOSPITAL_BASED_OUTPATIENT_CLINIC_OR_DEPARTMENT_OTHER): Payer: Self-pay

## 2024-03-31 ENCOUNTER — Other Ambulatory Visit (HOSPITAL_BASED_OUTPATIENT_CLINIC_OR_DEPARTMENT_OTHER): Payer: Self-pay

## 2024-04-05 ENCOUNTER — Ambulatory Visit: Admitting: Internal Medicine

## 2024-04-12 ENCOUNTER — Other Ambulatory Visit (HOSPITAL_BASED_OUTPATIENT_CLINIC_OR_DEPARTMENT_OTHER): Payer: Self-pay

## 2024-04-12 ENCOUNTER — Telehealth: Admitting: Physician Assistant

## 2024-04-12 DIAGNOSIS — L03116 Cellulitis of left lower limb: Secondary | ICD-10-CM | POA: Diagnosis not present

## 2024-04-12 MED ORDER — CEPHALEXIN 500 MG PO CAPS
500.0000 mg | ORAL_CAPSULE | Freq: Three times a day (TID) | ORAL | 0 refills | Status: AC
  Filled 2024-04-12: qty 21, 7d supply, fill #0

## 2024-04-12 NOTE — Patient Instructions (Addendum)
 " Victor Estrada, thank you for joining Victor CHRISTELLA Dickinson, PA-C for today's virtual visit.  While this provider is not your primary care provider (PCP), if your PCP is located in our provider database this encounter information will be shared with them immediately following your visit.   A [facility] MyChart account gives you access to today's visit and all your visits, tests, and labs performed at [facility]  click here if you don't have a [facility] MyChart account or go to [email or web address]  Consent: (Patient) Victor Estrada provided verbal consent for this virtual visit at the beginning of the encounter.  Current Medications:  Current Outpatient Medications:    cephALEXin  (KEFLEX ) 500 MG capsule, Take 1 capsule (500 mg total) by mouth 3 (three) times daily., Disp: 21 capsule, Rfl: 0   acetaminophen  (TYLENOL ) 500 MG tablet, Take 1,000 mg by mouth every 6 (six) hours as needed (for pain). , Disp: , Rfl:    benzonatate  (TESSALON ) 200 MG capsule, Take 1 capsule (200 mg total) by mouth 2 (two) times daily as needed for cough., Disp: 20 capsule, Rfl: 0   Continuous Glucose Sensor (DEXCOM G7 SENSOR) MISC, Apply 1 sensor every 10 days as directed, Disp: 9 each, Rfl: 4   empagliflozin  (JARDIANCE ) 25 MG TABS tablet, Take 1 tablet (25 mg total) by mouth daily before breakfast., Disp: 90 tablet, Rfl: 3   fluticasone  (FLONASE ) 50 MCG/ACT nasal spray, Place 2 sprays into both nostrils daily., Disp: 16 g, Rfl: 6   gabapentin  (NEURONTIN ) 600 MG tablet, Take 1.5 tablets (900 mg total) by mouth every 8 (eight) hours., Disp: 135 tablet, Rfl: 2   gabapentin  (NEURONTIN ) 600 MG tablet, Take 1 & 1/2 tablets by mouth every 8 hours, Disp: 405 tablet, Rfl: 1   gabapentin  (NEURONTIN ) 600 MG tablet, Take 1.5 tablets (900 mg total) by mouth every 8 (eight) hours., Disp: 405 tablet, Rfl: 1   gabapentin  (NEURONTIN ) 600 MG tablet, Take 1.5 tablets (900 mg total) by mouth every 8 (eight) hours., Disp:  135 tablet, Rfl: 2   gabapentin  (NEURONTIN ) 600 MG tablet, Take 1.5 tablets (900 mg total) by mouth every 8 (eight) hours., Disp: 405 tablet, Rfl: 1   ibuprofen (ADVIL) 200 MG tablet, Take 200 mg by mouth every 6 (six) hours as needed., Disp: , Rfl:    icosapent  Ethyl (VASCEPA ) 1 g capsule, Take 2 capsules (2 g total) by mouth 2 (two) times daily. With meals, Disp: 120 capsule, Rfl: 6   icosapent  Ethyl (VASCEPA ) 1 g capsule, Take 2 capsules (2 g total) by mouth 2 (two) times daily., Disp: 120 capsule, Rfl: 0   insulin  degludec (TRESIBA  FLEXTOUCH) 200 UNIT/ML FlexTouch Pen, Inject 40-42 Units into the skin daily., Disp: 27 mL, Rfl: 2   Insulin  Pen Needle (TECHLITE PLUS PEN NEEDLES) 32G X 4 MM MISC, Use once a day as directed., Disp: 100 each, Rfl: 3   lisinopril  (ZESTRIL ) 10 MG tablet, Take 1 tablet (10 mg total) by mouth daily., Disp: 90 tablet, Rfl: 1   lisinopril  (ZESTRIL ) 10 MG tablet, Take 1 tablet (10 mg total) by mouth daily., Disp: 90 tablet, Rfl: 0   lisinopril  (ZESTRIL ) 20 MG tablet, Take 1 tablet (20 mg total) by mouth daily., Disp: 30 tablet, Rfl: 2   metFORMIN  (GLUCOPHAGE ) 1000 MG tablet, Take 1 tablet (1,000 mg total) by mouth 2 (two) times daily with a meal., Disp: 180 tablet, Rfl: 3   Multiple Vitamin (MULTIVITAMIN) tablet, Take 1 tablet by mouth daily.,  Disp: , Rfl:    pantoprazole  (PROTONIX ) 40 MG tablet, Take 1 tablet (40 mg total) by mouth daily., Disp: 90 tablet, Rfl: 0   simvastatin  (ZOCOR ) 40 MG tablet, TAKE 1 TABLET BY MOUTH ONCE DAILY, Disp: 90 tablet, Rfl: 1   simvastatin  (ZOCOR ) 40 MG tablet, Take 1 tablet (40 mg total) by mouth daily., Disp: 90 tablet, Rfl: 1   Medications ordered in this encounter:  Meds ordered this encounter  Medications   cephALEXin  (KEFLEX ) 500 MG capsule    Sig: Take 1 capsule (500 mg total) by mouth 3 (three) times daily.    Dispense:  21 capsule    Refill:  0    Supervising Provider:   BLAISE ALEENE KIDD [[id number]]     *If you need refills  on other medications prior to your next appointment, please contact your pharmacy*  Follow-Up: Call back or seek an in-person evaluation if the symptoms worsen or if the condition fails to improve as anticipated.  [facility] [phone]  Other Instructions  Chronic Venous Insufficiency Chronic venous insufficiency is a condition that causes the veins in the legs to struggle to pump blood from the legs to the heart. It is also called venous stasis. This condition can happen when the vein walls are stretched, weakened, or damaged. It can also happen when the valves inside the vein are damaged. With the right treatment, you should be able to still lead an active life. What are the causes? Common causes of this condition include: Venous hypertension. This is high blood pressure inside the veins. Sitting or standing too long. This can cause increased blood pressure in the veins of the leg. Deep vein thrombosis (DVT). This is a blood clot that blocks blood flow in a vein. Phlebitis. This is inflammation of a vein. It can cause a blood clot to form. An abnormal growth of cells (tumor) in the area between your hip bones (pelvis). This can cause blood to back up. What increases the risk? Factors that may make you more likely to get this condition include: Having a family history of the condition. Being overweight. Being pregnant. Not getting enough exercise. Smoking. Having a job that requires you to sit or stand in one place for a long time. Being a certain age. Females in their 28s and 43s and males in their 52s are more likely to get this condition. What are the signs or symptoms? Symptoms of this condition include: Varicose veins. These are veins that are enlarged, bulging, or twisted. Skin breakdown or ulcers. Reddened skin or dark discoloration of the skin on the leg between the knee and ankle. Lipodermatosclerosis. This is brown, smooth, tight, and painful skin just  above the ankle. It is often on the inside of the leg. Swelling of the legs. How is this diagnosed? This condition may be diagnosed based on your medical history and a physical exam. You may also need tests, such as: A duplex ultrasound. This shows how blood flows through a blood vessel. Plethysmography. This tests blood flow. Venogram. This looks at the veins using an X-ray and dye. How is this treated? The goals of treatment are to help you return to an active life and to relieve pain. Treatment may include: Wearing compression stockings. These do not cure the condition but can help relieve symptoms. They can also help stop your condition from getting worse. Sclerotherapy. This involves injecting a solution to shrink damaged veins. Surgery. This may include: Vein stripping. This is  when a diseased vein is taken out. Laser ablation surgery. This is when blood flow is cut off through the vein. Repairing or remaking a valve inside the affected vein. Follow these instructions at home: Lifestyle Do not use any products that contain nicotine or tobacco. These products include cigarettes, chewing tobacco, and vaping devices, such as e-cigarettes. If you need help quitting, ask your health care provider. Stay active. Exercise, walk, or do other activities. Ask your provider what activities are safe for you. General instructions Take over-the-counter and prescription medicines only as told by your provider. Drink enough fluid to keep your pee (urine) pale yellow. Wear compression stockings as told by your provider. These stockings help to prevent blood clots and reduce swelling in your legs. Keep all follow-up visits. Your provider will check your legs for any changes and adjust your treatment plan as needed. Contact a health care provider if: You have redness, swelling, or more pain in the affected area. You see a red streak or line that goes up or down from the area. You have skin breakdown or  skin loss. You get an injury in the affected area. You get a fever. Get help right away if: You have severe pain that does not get better with medicine. You get an injury and an open wound in the affected area. Your foot or ankle becomes numb or weak all of a sudden. You have trouble moving your foot or ankle. Your symptoms do not go away or get worse. You have chest pain. You have shortness of breath. These symptoms may be an emergency. Get help right away. Call 911. Do not wait to see if the symptoms will go away. Do not drive yourself to the hospital. This information is not intended to replace advice given to you by your health care provider. Make sure you discuss any questions you have with your health care provider. Document Revised: 03/26/2022 Document Reviewed: 03/26/2022 Elsevier Patient Education  2024 Elsevier Inc.  Cellulitis, Adult  Cellulitis is a skin infection. The infected area is usually warm, red, swollen, and tender. It most commonly occurs on the lower body, such as the legs, feet, and toes, but this condition can occur on any part of the body. The infection can travel to the muscles, blood, and underlying tissue and become life-threatening without treatment. It is important to get medical treatment right away for this condition. What are the causes? Cellulitis is caused by bacteria. The bacteria enter through a break in the skin, such as a cut, burn, insect or animal bite, open sore, or crack. What increases the risk? This condition is more likely to occur in people who: Have a weak body's defense system (immune system). Are older than 53 years old. Have diabetes. Have a type of long-term (chronic) liver disease (cirrhosis) or kidney disease. Are obese. Have a skin condition such as: An itchy rash, such as eczema or psoriasis. A fungal rash on the feet or in skinfolds. Blistering rashes, such as shingles or chickenpox. Slow movement of blood in the veins  (venous stasis). Fluid buildup below the skin (edema). Have open wounds on the skin, such as cuts, puncture wounds, burns, bites, scrapes, tattoos, piercings, or wounds from surgery. Have had radiation therapy. Use IV drugs. What are the signs or symptoms? Symptoms of this condition include: Skin that looks red, purple, or slightly darker than your usual skin color. Streaks or spots on the skin. Swollen area of the skin. Tenderness or pain when an  area of the skin is touched. Warm skin. Fever or chills. Blisters. Tiredness (fatigue). How is this diagnosed? This condition is diagnosed based on a medical history and physical exam. You may also have tests, including: Blood tests. Imaging tests. Tests on a sample of fluid taken from the wound (wound culture). How is this treated? Treatment for this condition may include: Medicines. These may include antibiotics or medicines to treat allergies (antihistamines). Rest. Applying cold or warm wet cloths (compresses) to the skin. If the condition is severe, you may need to stay in the hospital and get antibiotics through an IV. The infection usually starts to get better within 1-2 days of treatment. Follow these instructions at home: Medicines Take over-the-counter and prescription medicines only as told by your health care provider. If you were prescribed antibiotics, take them as told by your provider. Do not stop using the antibiotic even if you start to feel better. General instructions Drink enough fluid to keep your pee (urine) pale yellow. Do not touch or rub the infected area. Raise (elevate) the infected area above the level of your heart while you are sitting or lying down. Return to your normal activities as told by your provider. Ask your provider what activities are safe for you. Apply warm or cold compresses to the affected area as told by your provider. Keep all follow-up visits. Your provider will need to make sure that a  more serious infection is not developing. Contact a health care provider if: You have a fever. Your symptoms do not improve within 1-2 days of starting treatment or you develop new symptoms. Your bone or joint underneath the infected area becomes painful after the skin has healed. Your infection returns in the same area or another area. Signs of this may include: You notice a swollen bump in the infected area. Your red area gets larger, turns dark in color, or becomes more painful. Drainage increases. Pus or a bad smell develops in your infected area. You have more pain. You feel ill and have muscle aches and weakness. You develop vomiting or diarrhea that will not go away. Get help right away if: You notice red streaks coming from the infected area. You notice the skin turns purple or black and falls off. This symptom may be an emergency. Get help right away. Call 911. Do not wait to see if the symptom will go away. Do not drive yourself to the hospital. This information is not intended to replace advice given to you by your health care provider. Make sure you discuss any questions you have with your health care provider. Document Revised: 11/06/2021 Document Reviewed: 11/06/2021 Elsevier Patient Education  2024 Elsevier Inc.   If you have been instructed to have an in-person evaluation today at a local Urgent Care facility, please use the link below. It will take you to a list of all of our available [facility], including address, phone number and hours of operation. Please do not delay care.  [facility]  If you or a family member do not have a primary care provider, use the link below to schedule a visit and establish care. When you choose a [facility] primary care physician or advanced practice provider, you gain a long-term partner in health. Find a Primary Care Provider  Learn more about [facility] in-office and virtual care options:  -  Get Care Now "

## 2024-04-12 NOTE — Progress Notes (Signed)
 " Virtual Visit Consent   Victor Estrada, you are scheduled for a virtual visit with a [facility] provider today. Just as with appointments in the office, your consent must be obtained to participate. Your consent will be active for this visit and any virtual visit you may have with one of our providers in the next 365 days. If you have a MyChart account, a copy of this consent can be sent to you electronically.  As this is a virtual visit, video technology does not allow for your provider to perform a traditional examination. This may limit your provider's ability to fully assess your condition. If your provider identifies any concerns that need to be evaluated in person or the need to arrange testing (such as labs, EKG, etc.), we will make arrangements to do so. Although advances in technology are sophisticated, we cannot ensure that it will always work on either your end or our end. If the connection with a video visit is poor, the visit may have to be switched to a telephone visit. With either a video or telephone visit, we are not always able to ensure that we have a secure connection.  By engaging in this virtual visit, you consent to the provision of healthcare and authorize for your insurance to be billed (if applicable) for the services provided during this visit. Depending on your insurance coverage, you may receive a charge related to this service.  I need to obtain your verbal consent now. Are you willing to proceed with your visit today? Victor Estrada has provided verbal consent on 04/12/2024 for a virtual visit (video or telephone). Victor CHRISTELLA Dickinson, PA-C  Date: 04/12/2024 3:03 PM   Virtual Visit via Video Note   I, Victor Estrada, connected with  Victor Estrada  ([id number], Jan 03, 1971) on 04/12/24 at  2:30 PM EST by a video-enabled telemedicine application and verified that I am speaking with the correct person using two identifiers.  Location: Patient: Virtual Visit Location  Patient: Home Provider: Virtual Visit Location Provider: Home Office   I discussed the limitations of evaluation and management by telemedicine and the availability of in person appointments. The patient expressed understanding and agreed to proceed.    History of Present Illness: Victor Estrada is a 54 y.o. who identifies as a male who was assigned male at birth, and is being seen today for cellulitis of the left leg.  HPI: Rash This is a recurrent (last episode was 10/2023) problem. The current episode started in the past 7 days (has small scratch just below left knee). The problem has been gradually worsening since onset. The affected locations include the left lower leg. The rash is characterized by redness (mildly warm, mildly tender). He was exposed to nothing. Pertinent negatives include no congestion, cough, facial edema, fatigue, fever or shortness of breath. (Denies chills)     Problems:  Patient Active Problem List   Diagnosis Date Noted   Class 3 obesity (HCC) 01/28/2020   Poorly controlled type 2 diabetes mellitus with peripheral neuropathy (HCC) 06/26/2018   Hypersomnia 01/19/2016   OSA (obstructive sleep apnea) 11/03/2015   Abdominal wall strain 07/28/2014   URI (upper respiratory infection) 04/20/2014   Cellulitis and abscess of face 11/11/2013   GERD (gastroesophageal reflux disease) 05/28/2013   Allergic rhinitis 05/28/2013   Routine general medical examination at a health care facility 05/28/2013   Adjustment disorder with mixed anxiety and depressed mood 01/27/2012   Sinusitis 12/20/2011   HTN (hypertension)  05/23/2011   Hyperlipidemia 05/23/2011   Morbid obesity (HCC) 05/23/2011    Allergies: Allergies[1] Medications: Current Medications[2]  Observations/Objective: Patient is well-developed, well-nourished in no acute distress.  Resting comfortably at home.  Head is normocephalic, atraumatic.  No labored breathing.  Speech is clear and coherent with logical  content.  Patient is alert and oriented at baseline.  Left leg there is 3 small abrasions (like a scratch) just distal to the knee, appear to be healing without issue. Left leg lower half is bright pink in color with a well-demarcated line to normal coloring above. Reported to be mildly warm and tender to touch. There is swelling present.   Assessment and Plan: 1. Left leg cellulitis (Primary) - cephALEXin  (KEFLEX ) 500 MG capsule; Take 1 capsule (500 mg total) by mouth 3 (three) times daily.  Dispense: 21 capsule; Refill: 0  - Worsening - Advised to elevate leg - Continue to keep clean and dry - Moisturize well - Added Keflex  for acute suspected cellulitis - Compression stockings - Seek in person evaluation if worsening or fails to improve  Follow Up Instructions: I discussed the assessment and treatment plan with the patient. The patient was provided an opportunity to ask questions and all were answered. The patient agreed with the plan and demonstrated an understanding of the instructions.  A copy of instructions were sent to the patient via MyChart unless otherwise noted below.    The patient was advised to call back or seek an in-person evaluation if the symptoms worsen or if the condition fails to improve as anticipated.    Victor HERO Anne Boltz, PA-C     [1] No Known Allergies [2]  Current Outpatient Medications:    cephALEXin  (KEFLEX ) 500 MG capsule, Take 1 capsule (500 mg total) by mouth 3 (three) times daily., Disp: 21 capsule, Rfl: 0   acetaminophen  (TYLENOL ) 500 MG tablet, Take 1,000 mg by mouth every 6 (six) hours as needed (for pain). , Disp: , Rfl:    benzonatate  (TESSALON ) 200 MG capsule, Take 1 capsule (200 mg total) by mouth 2 (two) times daily as needed for cough., Disp: 20 capsule, Rfl: 0   Continuous Glucose Sensor (DEXCOM G7 SENSOR) MISC, Apply 1 sensor every 10 days as directed, Disp: 9 each, Rfl: 4   empagliflozin  (JARDIANCE ) 25 MG TABS tablet, Take 1 tablet (25  mg total) by mouth daily before breakfast., Disp: 90 tablet, Rfl: 3   fluticasone  (FLONASE ) 50 MCG/ACT nasal spray, Place 2 sprays into both nostrils daily., Disp: 16 g, Rfl: 6   gabapentin  (NEURONTIN ) 600 MG tablet, Take 1.5 tablets (900 mg total) by mouth every 8 (eight) hours., Disp: 135 tablet, Rfl: 2   gabapentin  (NEURONTIN ) 600 MG tablet, Take 1 & 1/2 tablets by mouth every 8 hours, Disp: 405 tablet, Rfl: 1   gabapentin  (NEURONTIN ) 600 MG tablet, Take 1.5 tablets (900 mg total) by mouth every 8 (eight) hours., Disp: 405 tablet, Rfl: 1   gabapentin  (NEURONTIN ) 600 MG tablet, Take 1.5 tablets (900 mg total) by mouth every 8 (eight) hours., Disp: 135 tablet, Rfl: 2   gabapentin  (NEURONTIN ) 600 MG tablet, Take 1.5 tablets (900 mg total) by mouth every 8 (eight) hours., Disp: 405 tablet, Rfl: 1   ibuprofen (ADVIL) 200 MG tablet, Take 200 mg by mouth every 6 (six) hours as needed., Disp: , Rfl:    icosapent  Ethyl (VASCEPA ) 1 g capsule, Take 2 capsules (2 g total) by mouth 2 (two) times daily. With meals, Disp: 120 capsule,  Rfl: 6   icosapent  Ethyl (VASCEPA ) 1 g capsule, Take 2 capsules (2 g total) by mouth 2 (two) times daily., Disp: 120 capsule, Rfl: 0   insulin  degludec (TRESIBA  FLEXTOUCH) 200 UNIT/ML FlexTouch Pen, Inject 40-42 Units into the skin daily., Disp: 27 mL, Rfl: 2   Insulin  Pen Needle (TECHLITE PLUS PEN NEEDLES) 32G X 4 MM MISC, Use once a day as directed., Disp: 100 each, Rfl: 3   lisinopril  (ZESTRIL ) 10 MG tablet, Take 1 tablet (10 mg total) by mouth daily., Disp: 90 tablet, Rfl: 1   lisinopril  (ZESTRIL ) 10 MG tablet, Take 1 tablet (10 mg total) by mouth daily., Disp: 90 tablet, Rfl: 0   lisinopril  (ZESTRIL ) 20 MG tablet, Take 1 tablet (20 mg total) by mouth daily., Disp: 30 tablet, Rfl: 2   metFORMIN  (GLUCOPHAGE ) 1000 MG tablet, Take 1 tablet (1,000 mg total) by mouth 2 (two) times daily with a meal., Disp: 180 tablet, Rfl: 3   Multiple Vitamin (MULTIVITAMIN) tablet, Take 1 tablet by  mouth daily., Disp: , Rfl:    pantoprazole  (PROTONIX ) 40 MG tablet, Take 1 tablet (40 mg total) by mouth daily., Disp: 90 tablet, Rfl: 0   simvastatin  (ZOCOR ) 40 MG tablet, TAKE 1 TABLET BY MOUTH ONCE DAILY, Disp: 90 tablet, Rfl: 1   simvastatin  (ZOCOR ) 40 MG tablet, Take 1 tablet (40 mg total) by mouth daily., Disp: 90 tablet, Rfl: 1  "

## 2024-04-21 ENCOUNTER — Other Ambulatory Visit (HOSPITAL_BASED_OUTPATIENT_CLINIC_OR_DEPARTMENT_OTHER): Payer: Self-pay

## 2024-04-21 MED ORDER — ICOSAPENT ETHYL 1 G PO CAPS
2.0000 g | ORAL_CAPSULE | Freq: Two times a day (BID) | ORAL | 0 refills | Status: AC
  Filled 2024-04-21: qty 120, 30d supply, fill #0

## 2024-04-26 ENCOUNTER — Other Ambulatory Visit (HOSPITAL_BASED_OUTPATIENT_CLINIC_OR_DEPARTMENT_OTHER): Payer: Self-pay

## 2024-04-26 ENCOUNTER — Telehealth: Admitting: Emergency Medicine

## 2024-04-26 DIAGNOSIS — L03116 Cellulitis of left lower limb: Secondary | ICD-10-CM

## 2024-04-26 MED ORDER — DOXYCYCLINE HYCLATE 100 MG PO TABS
100.0000 mg | ORAL_TABLET | Freq: Two times a day (BID) | ORAL | 0 refills | Status: AC
  Filled 2024-04-26: qty 20, 10d supply, fill #0

## 2024-04-27 ENCOUNTER — Other Ambulatory Visit: Payer: Self-pay | Admitting: Internal Medicine

## 2024-04-27 ENCOUNTER — Other Ambulatory Visit (HOSPITAL_BASED_OUTPATIENT_CLINIC_OR_DEPARTMENT_OTHER): Payer: Self-pay

## 2024-04-27 MED ORDER — INSUPEN PEN NEEDLES 32G X 4 MM MISC
1.0000 | Freq: Every day | 3 refills | Status: AC
  Filled 2024-04-27: qty 100, 90d supply, fill #0

## 2024-05-31 ENCOUNTER — Ambulatory Visit: Admitting: Internal Medicine
